# Patient Record
Sex: Male | Born: 1965
Health system: Southern US, Community
[De-identification: ages and names within clinical notes are randomized; demographics above are authoritative.]

## PROBLEM LIST (undated history)

## (undated) DIAGNOSIS — M51369 Other intervertebral disc degeneration, lumbar region without mention of lumbar back pain or lower extremity pain: Secondary | ICD-10-CM

## (undated) DIAGNOSIS — E785 Hyperlipidemia, unspecified: Secondary | ICD-10-CM

## (undated) DIAGNOSIS — F419 Anxiety disorder, unspecified: Secondary | ICD-10-CM

## (undated) DIAGNOSIS — F329 Major depressive disorder, single episode, unspecified: Secondary | ICD-10-CM

## (undated) DIAGNOSIS — S83209A Unspecified tear of unspecified meniscus, current injury, unspecified knee, initial encounter: Secondary | ICD-10-CM

## (undated) DIAGNOSIS — I10 Essential (primary) hypertension: Secondary | ICD-10-CM

## (undated) DIAGNOSIS — M1990 Unspecified osteoarthritis, unspecified site: Secondary | ICD-10-CM

## (undated) DIAGNOSIS — F32A Depression, unspecified: Secondary | ICD-10-CM

## (undated) DIAGNOSIS — M5136 Other intervertebral disc degeneration, lumbar region: Secondary | ICD-10-CM

## (undated) DIAGNOSIS — M5126 Other intervertebral disc displacement, lumbar region: Secondary | ICD-10-CM

## (undated) HISTORY — DX: Hemochromatosis, unspecified: E83.119

## (undated) HISTORY — DX: Essential (primary) hypertension: I10

## (undated) HISTORY — DX: Hyperlipidemia, unspecified: E78.5

## (undated) HISTORY — PX: KNEE ARTHROSCOPY: SUR90

## (undated) HISTORY — PX: OTHER SURGICAL HISTORY: SHX169

---

## 1998-05-08 ENCOUNTER — Ambulatory Visit (HOSPITAL_COMMUNITY): Admission: RE | Admit: 1998-05-08 | Discharge: 1998-05-08 | Payer: Self-pay | Admitting: Family Medicine

## 1999-01-28 ENCOUNTER — Encounter: Admission: RE | Admit: 1999-01-28 | Discharge: 1999-03-30 | Payer: Self-pay | Admitting: Anesthesiology

## 2003-12-17 ENCOUNTER — Encounter: Admission: RE | Admit: 2003-12-17 | Discharge: 2003-12-17 | Payer: Self-pay | Admitting: Family Medicine

## 2005-12-07 ENCOUNTER — Ambulatory Visit (HOSPITAL_COMMUNITY): Admission: RE | Admit: 2005-12-07 | Discharge: 2005-12-07 | Payer: Self-pay | Admitting: Family Medicine

## 2007-01-04 ENCOUNTER — Emergency Department (HOSPITAL_COMMUNITY): Admission: EM | Admit: 2007-01-04 | Discharge: 2007-01-04 | Payer: Self-pay | Admitting: Emergency Medicine

## 2011-01-15 ENCOUNTER — Encounter: Payer: Self-pay | Admitting: Family Medicine

## 2011-10-10 ENCOUNTER — Other Ambulatory Visit: Payer: Self-pay | Admitting: Family Medicine

## 2011-10-10 DIAGNOSIS — R1011 Right upper quadrant pain: Secondary | ICD-10-CM

## 2011-10-12 ENCOUNTER — Ambulatory Visit (HOSPITAL_COMMUNITY)
Admission: RE | Admit: 2011-10-12 | Discharge: 2011-10-12 | Disposition: A | Discharge status: Home / Self Care | Payer: 59 | Source: Ambulatory Visit | Attending: Family Medicine | Admitting: Family Medicine

## 2011-10-12 DIAGNOSIS — I10 Essential (primary) hypertension: Secondary | ICD-10-CM | POA: Insufficient documentation

## 2011-10-12 DIAGNOSIS — K824 Cholesterolosis of gallbladder: Secondary | ICD-10-CM | POA: Insufficient documentation

## 2011-10-12 DIAGNOSIS — R1011 Right upper quadrant pain: Secondary | ICD-10-CM | POA: Insufficient documentation

## 2011-10-12 DIAGNOSIS — E785 Hyperlipidemia, unspecified: Secondary | ICD-10-CM | POA: Insufficient documentation

## 2012-02-16 ENCOUNTER — Telehealth: Payer: Self-pay

## 2012-02-16 NOTE — Telephone Encounter (Signed)
.  [facility] Pt would like copies of his prior immunizations done here.  Please call patient with status of records at [phone].

## 2012-02-17 NOTE — Telephone Encounter (Signed)
Copied immunizations (2 tdap). Lmom to pick up.

## 2012-02-25 ENCOUNTER — Ambulatory Visit (INDEPENDENT_AMBULATORY_CARE_PROVIDER_SITE_OTHER): Payer: 59 | Admitting: Family Medicine

## 2012-02-25 ENCOUNTER — Ambulatory Visit: Payer: 59

## 2012-02-25 VITALS — BP 136/81 | HR 84 | Temp 98.2°F | Resp 16 | Ht 72.0 in | Wt 194.0 lb

## 2012-02-25 DIAGNOSIS — M1710 Unilateral primary osteoarthritis, unspecified knee: Secondary | ICD-10-CM

## 2012-02-25 DIAGNOSIS — M25569 Pain in unspecified knee: Secondary | ICD-10-CM

## 2012-02-25 DIAGNOSIS — M25561 Pain in right knee: Secondary | ICD-10-CM

## 2012-02-25 MED ORDER — MELOXICAM 7.5 MG PO TABS: 7.5000 mg | ORAL_TABLET | Freq: Every day | ORAL | Status: DC

## 2012-02-25 MED ORDER — HYDROCODONE-ACETAMINOPHEN 5-325 MG PO TABS: 1.0000 | ORAL_TABLET | Freq: Four times a day (QID) | ORAL | Status: AC | PRN
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
## 2012-03-16 ENCOUNTER — Ambulatory Visit: Payer: Self-pay | Admitting: Family Medicine

## 2012-03-16 VITALS — BP 134/83 | HR 77 | Temp 98.1°F | Resp 16 | Ht 71.0 in | Wt 200.0 lb

## 2012-03-16 DIAGNOSIS — M25569 Pain in unspecified knee: Secondary | ICD-10-CM

## 2012-03-16 DIAGNOSIS — M25561 Pain in right knee: Secondary | ICD-10-CM

## 2012-03-16 DIAGNOSIS — M1711 Unilateral primary osteoarthritis, right knee: Secondary | ICD-10-CM

## 2012-03-16 DIAGNOSIS — IMO0002 Reserved for concepts with insufficient information to code with codable children: Secondary | ICD-10-CM

## 2012-03-16 DIAGNOSIS — M1710 Unilateral primary osteoarthritis, unspecified knee: Secondary | ICD-10-CM

## 2012-03-16 MED ORDER — TRIAMCINOLONE ACETONIDE 40 MG/ML IJ SUSP: 40.0000 mg | Freq: Once | INTRAMUSCULAR | Status: DC

## 2012-03-16 MED ORDER — BUPIVACAINE HCL 0.5 % IJ SOLN: 50.0000 mL | Freq: Once | INTRAMUSCULAR | Status: DC

## 2012-03-16 MED ORDER — LIDOCAINE HCL (PF) 1 % IJ SOLN: 2.0000 mL | Freq: Once | INTRAMUSCULAR | Status: DC

## 2012-03-16 MED ORDER — BUPIVACAINE HCL 0.5 % IJ SOLN: 2.0000 mL | Freq: Once | INTRAMUSCULAR | Status: DC

## 2012-03-16 NOTE — Progress Notes (Signed)
[facility]:  Office Visit  Chief Complaint:  Chief Complaint  Patient presents with  . Knee Pain    R - not getting any better    HPI: Blake Roberts is a 46 y.o. male who complains of  Right knee pain on medial aspect. He was seen here on 02/25/12 by Dr. Neva Roberts and Blake Roberts were done showing DJD/OA. HE opted to do conservative txt with NSAIDs. Tried conservative treatment without relief. Patient is here because he ants to try steroid injections.   No past medical history on file. No past surgical history on file. History   Social History  . Marital Status: Single    Spouse Name: N/A    Number of Children: N/A  . Years of Education: N/A   Social History Main Topics  . Smoking status: Never Smoker   . Smokeless tobacco: None  . Alcohol Use: None  . Drug Use: None  . Sexually Active: None   Other Topics Concern  . None   Social History Narrative  . None   No family history on file. Allergies  Allergen Reactions  . Ace Inhibitors    Prior to Admission medications   Medication Sig Start Date End Date Taking? Authorizing Provider  amLODipine (NORVASC) 5 MG tablet Take 10 mg by mouth daily.    Yes Historical Provider, MD  buPROPion (WELLBUTRIN XL) 300 MG 24 hr tablet Take 300 mg by mouth daily.   Yes Historical Provider, MD  eszopiclone (LUNESTA) 1 MG TABS Take 3 mg by mouth at bedtime. Take immediately before bedtime   Yes Historical Provider, MD  lamoTRIgine (LAMICTAL) 200 MG tablet Take 200 mg by mouth daily.   Yes Historical Provider, MD  sertraline (ZOLOFT) 100 MG tablet Take 100 mg by mouth daily.   Yes Historical Provider, MD  tadalafil (CIALIS) 20 MG tablet Take 20 mg by mouth daily as needed.   Yes Historical Provider, MD  zaleplon (SONATA) 10 MG capsule Take 10 mg by mouth at bedtime.   Yes Historical Provider, MD  meloxicam (MOBIC) 7.5 MG tablet Take 1 tablet (7.5 mg total) by mouth daily. 02/25/12 02/24/13  Blake Flood, MD     ROS: The  patient denies fevers, chills, night sweats, unintentional weight loss, chest pain, palpitations, wheezing, dyspnea on exertion, nausea, vomiting, abdominal pain, dysuria, hematuria, melena, numbness, weakness, or tingling. + R knee pain  All other systems have been reviewed and were otherwise negative with the exception of those mentioned in the HPI and as above.    PHYSICAL EXAM: Filed Vitals:   03/16/12 0944  BP: 134/83  Pulse: 77  Temp: 98.1 F (36.7 C)  Resp: 16   Filed Vitals:   03/16/12 0944  Height: 5\' 11"  (1.803 m)  Weight: 200 lb (90.719 kg)   Body mass index is 27.89 kg/(m^2).  General: Alert, no acute distress HEENT:  Normocephalic, atraumatic, oropharynx patent.  Cardiovascular:  Regular rate and rhythm, no rubs murmurs or gallops.  No Carotid bruits, radial pulse intact. No pedal edema.  Respiratory: Clear to auscultation bilaterally.  No wheezes, rales, or rhonchi.  No cyanosis, no use of accessory musculature GI: No organomegaly, abdomen is soft and non-tender, positive bowel sounds.  No masses. Skin: No rashes. Neurologic: Facial musculature symmetric. Psychiatric: Patient is appropriate throughout our interaction. Lymphatic: No cervical lymphadenopathy Musculoskeletal: Gait intact. Right knee: + Right medial knee tenderness at joint line, minimal effusion on ballotment on medial aspect ROM intact. 5/5  strength, sensation intact.  Post tib and dorsalis pedis intact. No rashes.  Lachmann negative.    LABS: No results found for this or any previous visit.   EKG/XRAY:   Primary read interpreted by Dr. Conley Roberts at [facility].   ASSESSMENT/PLAN: Encounter Diagnoses  Name Primary?  . Knee pain, right Yes  . Osteoarthritis of right knee     Patient verbally consented to procedure after risks and benefits expalined including depigmentation, infection, blood loss, fat atrophy/dimpling, etc. Lateral approach for injection was completed. 1 ml of Kenalog  40 mg, 2 ml  Marcaine and 2 ml of Lidocaine.  Prepped with betadine and alcohol.  Patient tolerated procedure without complications, no blood loss.   C/w conservative treatment and ROM F/u prn   Blake Degraffenreid PHUONG, DO 03/16/2012 11:26 AM

## 2012-04-04 ENCOUNTER — Telehealth: Payer: Self-pay

## 2012-04-04 NOTE — Telephone Encounter (Signed)
Rebekah to make CD called and informed pt  On VM that CD ready for pick up.

## 2012-04-04 NOTE — Telephone Encounter (Signed)
PT HAVE AN APPT WITH A REFERRAL WE HAD MADE FOR HIM AND HE NEED TO COME BY AND P/U A COPY OF HIS XRAYS PLEASE CALL [phone]. THE APPT IS AT THE END OF NEXT WEEK

## 2012-06-11 ENCOUNTER — Other Ambulatory Visit: Payer: Self-pay

## 2012-06-11 MED ORDER — AMLODIPINE BESYLATE 5 MG PO TABS: 10.0000 mg | ORAL_TABLET | Freq: Every day | ORAL | Status: DC

## 2012-07-05 ENCOUNTER — Other Ambulatory Visit: Payer: Self-pay | Admitting: Orthopedic Surgery

## 2012-07-06 ENCOUNTER — Encounter (HOSPITAL_BASED_OUTPATIENT_CLINIC_OR_DEPARTMENT_OTHER): Payer: Self-pay | Admitting: *Deleted

## 2012-07-13 ENCOUNTER — Encounter (HOSPITAL_BASED_OUTPATIENT_CLINIC_OR_DEPARTMENT_OTHER): Payer: Self-pay | Admitting: Anesthesiology

## 2012-07-13 ENCOUNTER — Ambulatory Visit (HOSPITAL_BASED_OUTPATIENT_CLINIC_OR_DEPARTMENT_OTHER): Payer: BC Managed Care – PPO | Admitting: Anesthesiology

## 2012-07-13 ENCOUNTER — Ambulatory Visit (HOSPITAL_BASED_OUTPATIENT_CLINIC_OR_DEPARTMENT_OTHER)
Admission: RE | Admit: 2012-07-13 | Discharge: 2012-07-13 | Disposition: A | Discharge status: Home / Self Care | Payer: BC Managed Care – PPO | Source: Ambulatory Visit | Attending: Orthopedic Surgery | Admitting: Orthopedic Surgery

## 2012-07-13 ENCOUNTER — Encounter (HOSPITAL_BASED_OUTPATIENT_CLINIC_OR_DEPARTMENT_OTHER)
Admission: RE | Disposition: A | Discharge status: Home / Self Care | Payer: Self-pay | Source: Ambulatory Visit | Attending: Orthopedic Surgery

## 2012-07-13 ENCOUNTER — Encounter (HOSPITAL_BASED_OUTPATIENT_CLINIC_OR_DEPARTMENT_OTHER): Payer: Self-pay | Admitting: *Deleted

## 2012-07-13 DIAGNOSIS — X58XXXA Exposure to other specified factors, initial encounter: Secondary | ICD-10-CM | POA: Insufficient documentation

## 2012-07-13 DIAGNOSIS — Z79899 Other long term (current) drug therapy: Secondary | ICD-10-CM | POA: Insufficient documentation

## 2012-07-13 DIAGNOSIS — M1710 Unilateral primary osteoarthritis, unspecified knee: Secondary | ICD-10-CM | POA: Insufficient documentation

## 2012-07-13 DIAGNOSIS — Z9889 Other specified postprocedural states: Secondary | ICD-10-CM

## 2012-07-13 DIAGNOSIS — IMO0002 Reserved for concepts with insufficient information to code with codable children: Secondary | ICD-10-CM | POA: Insufficient documentation

## 2012-07-13 DIAGNOSIS — E785 Hyperlipidemia, unspecified: Secondary | ICD-10-CM | POA: Insufficient documentation

## 2012-07-13 DIAGNOSIS — I10 Essential (primary) hypertension: Secondary | ICD-10-CM | POA: Insufficient documentation

## 2012-07-13 HISTORY — DX: Anxiety disorder, unspecified: F41.9

## 2012-07-13 HISTORY — DX: Unspecified osteoarthritis, unspecified site: M19.90

## 2012-07-13 HISTORY — PX: KNEE ARTHROSCOPY: SHX127

## 2012-07-13 HISTORY — DX: Depression, unspecified: F32.A

## 2012-07-13 HISTORY — DX: Other intervertebral disc degeneration, lumbar region: M51.36

## 2012-07-13 HISTORY — DX: Unspecified tear of unspecified meniscus, current injury, unspecified knee, initial encounter: S83.209A

## 2012-07-13 HISTORY — DX: Other intervertebral disc displacement, lumbar region: M51.26

## 2012-07-13 HISTORY — DX: Major depressive disorder, single episode, unspecified: F32.9

## 2012-07-13 HISTORY — DX: Other intervertebral disc degeneration, lumbar region without mention of lumbar back pain or lower extremity pain: M51.369

## 2012-07-13 LAB — POCT I-STAT 4, (NA,K, GLUC, HGB,HCT): Hemoglobin: 15.6 g/dL (ref 13.0–17.0)

## 2012-07-13 SURGERY — ARTHROSCOPY, KNEE
Anesthesia: General | Site: Knee | Laterality: Right | Wound class: Clean

## 2012-07-13 MED ORDER — LACTATED RINGERS IV SOLN
INTRAVENOUS | Status: DC
Start: 2012-07-13 — End: 2012-07-13
  Administered 2012-07-13: 14:00:00 via INTRAVENOUS
  Administered 2012-07-13: 100 mL/h via INTRAVENOUS

## 2012-07-13 MED ORDER — LIDOCAINE HCL (CARDIAC) 20 MG/ML IV SOLN
INTRAVENOUS | Status: DC | PRN
  Administered 2012-07-13: 60 mg via INTRAVENOUS

## 2012-07-13 MED ORDER — MIDAZOLAM HCL 5 MG/5ML IJ SOLN
INTRAMUSCULAR | Status: DC | PRN
  Administered 2012-07-13: 2 mg via INTRAVENOUS

## 2012-07-13 MED ORDER — DEXAMETHASONE SODIUM PHOSPHATE 4 MG/ML IJ SOLN
INTRAMUSCULAR | Status: DC | PRN
  Administered 2012-07-13: 10 mg via INTRAVENOUS

## 2012-07-13 MED ORDER — OXYCODONE-ACETAMINOPHEN 7.5-325 MG PO TABS: 1.0000 | ORAL_TABLET | ORAL | Status: AC | PRN

## 2012-07-13 MED ORDER — BUPIVACAINE-EPINEPHRINE 0.5% -1:200000 IJ SOLN
INTRAMUSCULAR | Status: DC | PRN
  Administered 2012-07-13: 20 mL

## 2012-07-13 MED ORDER — LACTATED RINGERS IV SOLN: INTRAVENOUS | Status: DC

## 2012-07-13 MED ORDER — PROMETHAZINE HCL 25 MG/ML IJ SOLN: 6.2500 mg | INTRAMUSCULAR | Status: DC | PRN

## 2012-07-13 MED ORDER — MORPHINE SULFATE 4 MG/ML IJ SOLN
INTRAMUSCULAR | Status: DC | PRN
  Administered 2012-07-13: 4 mg via INTRAVENOUS

## 2012-07-13 MED ORDER — SODIUM CHLORIDE 0.9 % IR SOLN
Status: DC | PRN
  Administered 2012-07-13: 9000 mL

## 2012-07-13 MED ORDER — KETOROLAC TROMETHAMINE 30 MG/ML IJ SOLN
INTRAMUSCULAR | Status: DC | PRN
  Administered 2012-07-13: 30 mg via INTRAVENOUS

## 2012-07-13 MED ORDER — FENTANYL CITRATE 0.05 MG/ML IJ SOLN: 25.0000 ug | INTRAMUSCULAR | Status: DC | PRN

## 2012-07-13 MED ORDER — FENTANYL CITRATE 0.05 MG/ML IJ SOLN
INTRAMUSCULAR | Status: DC | PRN
  Administered 2012-07-13: 50 ug via INTRAVENOUS
  Administered 2012-07-13: 100 ug via INTRAVENOUS
  Administered 2012-07-13: 50 ug via INTRAVENOUS

## 2012-07-13 MED ORDER — OXYCODONE-ACETAMINOPHEN 5-325 MG PO TABS
1.0000 | ORAL_TABLET | ORAL | Status: DC | PRN
  Administered 2012-07-13: 1 via ORAL

## 2012-07-13 MED ORDER — ONDANSETRON HCL 4 MG/2ML IJ SOLN
INTRAMUSCULAR | Status: DC | PRN
  Administered 2012-07-13: 4 mg via INTRAVENOUS

## 2012-07-13 MED ORDER — EPINEPHRINE HCL 1 MG/ML IJ SOLN
INTRAMUSCULAR | Status: DC | PRN
  Administered 2012-07-13: 2 mg

## 2012-07-13 MED ORDER — PROPOFOL 10 MG/ML IV EMUL
INTRAVENOUS | Status: DC | PRN
  Administered 2012-07-13: 250 mg via INTRAVENOUS

## 2012-07-13 MED ORDER — POVIDONE-IODINE 7.5 % EX SOLN: Freq: Once | CUTANEOUS | Status: DC

## 2012-07-13 SURGICAL SUPPLY — 51 items
BANDAGE ELASTIC 6 VELCRO ST LF (GAUZE/BANDAGES/DRESSINGS) ×3 IMPLANT
BANDAGE ESMARK 6X9 LF (GAUZE/BANDAGES/DRESSINGS) ×1 IMPLANT
BANDAGE GAUZE ELAST BULKY 4 IN (GAUZE/BANDAGES/DRESSINGS) ×2 IMPLANT
BLADE 4.2CUDA (BLADE) IMPLANT
BLADE CUDA 5.5 (BLADE) IMPLANT
BLADE CUDA SHAVER 3.5 (BLADE) ×2 IMPLANT
BLADE CUTTER GATOR 3.5 (BLADE) IMPLANT
BLADE GREAT WHITE 4.2 (BLADE) IMPLANT
BNDG CMPR 9X6 STRL LF SNTH (GAUZE/BANDAGES/DRESSINGS) ×1
BNDG ESMARK 6X9 LF (GAUZE/BANDAGES/DRESSINGS) ×2
CANISTER SUCT LVC 12 LTR MEDI- (MISCELLANEOUS) ×3 IMPLANT
CANISTER SUCTION 1200CC (MISCELLANEOUS) ×2 IMPLANT
CLOTH BEACON ORANGE TIMEOUT ST (SAFETY) ×2 IMPLANT
DRAPE ARTHROSCOPY W/POUCH 114 (DRAPES) ×2 IMPLANT
DRAPE LG THREE QUARTER DISP (DRAPES) ×2 IMPLANT
DRSG EMULSION OIL 3X3 NADH (GAUZE/BANDAGES/DRESSINGS) ×2 IMPLANT
DRSG PAD ABDOMINAL 8X10 ST (GAUZE/BANDAGES/DRESSINGS) ×1 IMPLANT
DURAPREP 26ML APPLICATOR (WOUND CARE) ×2 IMPLANT
ELECT MENISCUS 165MM 90D (ELECTRODE) IMPLANT
ELECT REM PT RETURN 9FT ADLT (ELECTROSURGICAL)
ELECTRODE REM PT RTRN 9FT ADLT (ELECTROSURGICAL) IMPLANT
GLOVE BIOGEL PI IND STRL 8 (GLOVE) ×1 IMPLANT
GLOVE BIOGEL PI INDICATOR 8 (GLOVE) ×1
GLOVE ECLIPSE 8.0 STRL XLNG CF (GLOVE) ×4 IMPLANT
GLOVE INDICATOR 8.0 STRL GRN (GLOVE) ×4 IMPLANT
GOWN PREVENTION PLUS LG XLONG (DISPOSABLE) ×2 IMPLANT
GOWN PREVENTION PLUS XLARGE (GOWN DISPOSABLE) ×1 IMPLANT
GOWN STRL NON-REIN LRG LVL3 (GOWN DISPOSABLE) ×1 IMPLANT
GOWN STRL REIN XL XLG (GOWN DISPOSABLE) ×2 IMPLANT
IV NS IRRIG 3000ML ARTHROMATIC (IV SOLUTION) ×5 IMPLANT
KNEE WRAP E Z 3 GEL PACK (MISCELLANEOUS) ×2 IMPLANT
NDL HYPO 18GX1.5 BLUNT FILL (NEEDLE) ×1 IMPLANT
NDL SAFETY ECLIPSE 18X1.5 (NEEDLE) ×1 IMPLANT
NEEDLE HYPO 18GX1.5 BLUNT FILL (NEEDLE) ×2 IMPLANT
NEEDLE HYPO 18GX1.5 SHARP (NEEDLE) ×2
PACK ARTHROSCOPY DSU (CUSTOM PROCEDURE TRAY) ×2 IMPLANT
PACK BASIN DAY SURGERY FS (CUSTOM PROCEDURE TRAY) ×2 IMPLANT
PADDING CAST ABS 4INX4YD NS (CAST SUPPLIES) ×1
PADDING CAST ABS COTTON 4X4 ST (CAST SUPPLIES) ×1 IMPLANT
PENCIL BUTTON HOLSTER BLD 10FT (ELECTRODE) IMPLANT
SET ARTHROSCOPY TUBING (MISCELLANEOUS) ×2
SET ARTHROSCOPY TUBING LN (MISCELLANEOUS) ×1 IMPLANT
SPONGE GAUZE 4X4 12PLY (GAUZE/BANDAGES/DRESSINGS) ×2 IMPLANT
SUT ETHIBOND 2 OS 4 DA (SUTURE) IMPLANT
SUT ETHILON 4 0 PS 2 18 (SUTURE) ×2 IMPLANT
SUT VIC AB 0 CT1 36 (SUTURE) IMPLANT
SUT VIC AB 2-0 PS2 27 (SUTURE) IMPLANT
SYRINGE 10CC LL (SYRINGE) ×2 IMPLANT
TOWEL OR 17X24 6PK STRL BLUE (TOWEL DISPOSABLE) ×2 IMPLANT
WAND 90 DEG TURBOVAC W/CORD (SURGICAL WAND) IMPLANT
WATER STERILE IRR 500ML POUR (IV SOLUTION) ×2 IMPLANT

## 2012-07-13 NOTE — H&P (Signed)
Blake Roberts is an 46 y.o. male.   Chief Complaint:painful rt knee HPIMRI demonstrates torn medial meniscus  Past Medical History  Diagnosis Date  . Hyperlipidemia   . Hypertension   . Acute meniscal tear of knee RIGHT KNEE  . Depression   . Anxiety   . Bulging lumbar disc L5 - S1  . Arthritis RIGHT KNEE    Past Surgical History  Procedure Date  . Knee arthroscopy AGE 25 (APPROX)    RIGHT KNEE    Family History  Problem Relation Age of Onset  . Cancer Mother   . Diabetes Father   . Heart disease Father   . Hyperlipidemia Father   . Hypertension Father   . Kidney disease Father    Social History:  reports that he has never smoked. He has never used smokeless tobacco. He reports that he drinks about 8.4 ounces of alcohol per week. He reports that he does not use illicit drugs.  Allergies:  Allergies  Allergen Reactions  . Ace Inhibitors Other (See Comments)    ANGIOEDEMA OF JOINTS- SEVERE    Medications Prior to Admission  Medication Sig Dispense Refill  . amLODipine (NORVASC) 5 MG tablet Take 5 mg by mouth every morning.      Marland Kitchen buPROPion (WELLBUTRIN XL) 300 MG 24 hr tablet Take 300 mg by mouth daily.      . clonazePAM (KLONOPIN) 0.5 MG tablet Take 0.5 mg by mouth 2 (two) times daily as needed.      Marland Kitchen Ketorolac Tromethamine (TORADOL ORAL PO) Take by mouth as needed.      . Multiple Vitamins-Minerals (MULTIVITAMIN & MINERAL PO) Take by mouth.      . Omega-3 Fatty Acids (FISH OIL PO) Take by mouth.        Results for orders placed during the hospital encounter of 07/13/12 (from the past 48 hour(s))  POCT I-STAT 4, (NA,K, GLUC, HGB,HCT)     Status: Normal   Collection Time   07/13/12 10:54 AM      Component Value Range Comment   Sodium 142  135 - 145 mEq/L    Potassium 4.8  3.5 - 5.1 mEq/L    Glucose, Bld 95  70 - 99 mg/dL    HCT 40.9  81.1 - 91.4 %    Hemoglobin 15.6  13.0 - 17.0 g/dL    No results found.  ROS  Blood pressure 120/81, pulse 71, temperature  97.6 F (36.4 C), temperature source Oral, resp. rate 18, height 5\' 11"  (1.803 m), weight 86.183 kg (190 lb), SpO2 96.00%. Physical Exam  Constitutional: He is oriented to person, place, and time. He appears well-developed and well-nourished.  HENT:  Head: Normocephalic and atraumatic.  Right Ear: External ear normal.  Left Ear: External ear normal.  Nose: Nose normal.  Mouth/Throat: Oropharynx is clear and moist.  Eyes: Conjunctivae and EOM are normal. Pupils are equal, round, and reactive to light.  Neck: Normal range of motion. Neck supple.  Cardiovascular: Normal rate, regular rhythm, normal heart sounds and intact distal pulses.   Respiratory: Effort normal and breath sounds normal.  GI: Soft. Bowel sounds are normal.  Musculoskeletal: Normal range of motion. He exhibits tenderness.       Tender medial joint line rt knee  Neurological: He is alert and oriented to person, place, and time. He has normal reflexes.  Skin: Skin is warm and dry.  Psychiatric: He has a normal mood and affect. His behavior is normal. Judgment and  thought content normal.     Assessment/Plan Torn medial meniscus rt knee Rt knee arthroscopy with partial medial meniscectomy  Lillan Mccreadie P 07/13/2012, 12:39 PM

## 2012-07-13 NOTE — Anesthesia Postprocedure Evaluation (Signed)
  Anesthesia Post-op Note  Patient: Blake Roberts  Procedure(s) Performed: Procedure(s) (LRB): ARTHROSCOPY KNEE (Right)  Patient Location: PACU  Anesthesia Type: General  Level of Consciousness: awake and alert   Airway and Oxygen Therapy: Patient Spontanous Breathing  Post-op Pain: mild  Post-op Assessment: Post-op Vital signs reviewed, Patient's Cardiovascular Status Stable, Respiratory Function Stable, Patent Airway and No signs of Nausea or vomiting  Post-op Vital Signs: stable  Complications: No apparent anesthesia complications

## 2012-07-13 NOTE — Anesthesia Preprocedure Evaluation (Signed)
Anesthesia Evaluation  Patient identified by MRN, date of birth, ID band Patient awake    Reviewed: Allergy & Precautions, H&P , NPO status , Patient's Chart, lab work & pertinent test results  Airway Mallampati: II TM Distance: >3 FB Neck ROM: Full    Dental  (+) Teeth Intact and Dental Advisory Given,    Pulmonary neg pulmonary ROS,  breath sounds clear to auscultation  Pulmonary exam normal       Cardiovascular hypertension, Pt. on medications Rhythm:Regular Rate:Normal     Neuro/Psych Anxiety Depression negative neurological ROS     GI/Hepatic negative GI ROS, Neg liver ROS,   Endo/Other  negative endocrine ROS  Renal/GU negative Renal ROS  negative genitourinary   Musculoskeletal negative musculoskeletal ROS (+)   Abdominal   Peds  Hematology negative hematology ROS (+)   Anesthesia Other Findings   Reproductive/Obstetrics negative OB ROS                           Anesthesia Physical Anesthesia Plan  ASA: II  Anesthesia Plan: General   Post-op Pain Management:    Induction: Intravenous  Airway Management Planned: LMA  Additional Equipment:   Intra-op Plan:   Post-operative Plan: Extubation in OR  Informed Consent: I have reviewed the patients History and Physical, chart, labs and discussed the procedure including the risks, benefits and alternatives for the proposed anesthesia with the patient or authorized representative who has indicated his/her understanding and acceptance.   Dental advisory given  Plan Discussed with: CRNA  Anesthesia Plan Comments:         Anesthesia Quick Evaluation

## 2012-07-13 NOTE — Brief Op Note (Signed)
07/13/2012  2:02 PM  PATIENT:  Blake Roberts  46 y.o. male  PRE-OPERATIVE DIAGNOSIS:  RIGHT KNEE TORN MEDIAL MENISCUS ,osteoarthritis and loose body  POST-OPERATIVE DIAGNOSIS:  SAME  PROCEDURE:  Procedure(s) (LRB): ARTHROSCOPY KNEE (Right)with partial medial meniscectomy, shaving medial femoral condyle and removal loose body  SURGEON:  Surgeon(s) and Role:    * Drucilla Schmidt, MD - Primary  PHYSICIAN ASSISTANT:   ASSISTANTS: nurse  ANESTHESIA:   local and general  EBL:  Total I/O In: 1300 [I.V.:1300] Out: -   BLOOD ADMINISTERED:none  DRAINS: none   LOCAL MEDICATIONS USED:  MARCAINE     SPECIMEN:  No Specimen  DISPOSITION OF SPECIMEN:  N/A  COUNTS:  YES  TOURNIQUET:   Total Tourniquet Time Documented: Thigh (Right) - 46 minutes  DICTATION: .Other Dictation: Dictation Number [id number]  PLAN OF CARE: Discharge to home after PACU  PATIENT DISPOSITION:  PACU - hemodynamically stable.   Delay start of Pharmacological VTE agent (>24hrs) due to surgical blood loss or risk of bleeding: not applicable

## 2012-07-13 NOTE — Transfer of Care (Signed)
Immediate Anesthesia Transfer of Care Note  Patient: Blake Roberts  Procedure(s) Performed: Procedure(s) (LRB): ARTHROSCOPY KNEE (Right)  Patient Location: PACU  Anesthesia Type: General  Level of Consciousness: awake, alert  and oriented  Airway & Oxygen Therapy: Patient Spontanous Breathing and Patient connected to face mask oxygen  Post-op Assessment: Report given to PACU RN and Post -op Vital signs reviewed and stable  Post vital signs: Reviewed and stable  Complications: No apparent anesthesia complications

## 2012-07-16 ENCOUNTER — Encounter (HOSPITAL_BASED_OUTPATIENT_CLINIC_OR_DEPARTMENT_OTHER): Payer: Self-pay | Admitting: Orthopedic Surgery

## 2012-07-16 NOTE — Op Note (Signed)
[name], Blake Roberts               ACCOUNT NO.:  [id number]  MEDICAL RECORD NO.:  [id number]  LOCATION:                               FACILITY:  [facility]  PHYSICIAN:  Marlowe Kays, M.D.  DATE OF BIRTH:  [birth year]  DATE OF PROCEDURE:  07/13/2012 DATE OF DISCHARGE:                              OPERATIVE REPORT   PREOPERATIVE DIAGNOSES: 1. Torn medial meniscus. 2. Osteoarthritis, right knee.  POSTOPERATIVE DIAGNOSES: 1. Torn medial meniscus. 2. Osteoarthritis, right knee.  OPERATION:  Right knee arthroscopy with partial medial meniscectomy and shaving of medial femoral condyle.  SURGEON:  Marlowe Kays, M.D.  ASSISTANT:  Nurse.  ANESTHESIA:  General.  PATHOLOGY AND JUSTIFICATION FOR PROCEDURE:  Painful right knee with an MRI on 06/30/2012 demonstrating the preoperative diagnoses.  PROCEDURE:  After satisfactory general anesthesia, Ace wrap, and knee support to left lower extremity, pneumatic tourniquet applied to right lower extremity with the leg Esmarch out non sterilely, and tourniquet inflated to 300 mmHg.  Thigh stabilizer was applied.  The right leg was then prepped with DuraPrep from stabilizer to ankle, draped in sterile field.  Time-out performed.  Superior medial saline inflow.  First anteromedial portal, lateral compartment knee joint was evaluated. There was good bit of fibrous tissue and some synovitis, which I had to clear out for visualization purposes with a 3.5 shaver.  He had some grade I wear of the lateral femoral condyle.  The lateral meniscus was intact.  Reversing portals, ACL was intact.  Looking at the medial gutter, patella had normal looking joint surface.  In the medial joint, was an osteochondral body which was loose and probably what was visualized on the MRI in the suprapatellar area.  He had a grade III and some areas probably grade IV of the medial femoral condyle, which I shaved down with a 3.5 shaver, and this appeared to be where  the osteochondral fragment arose from.  Posteriorly, he did have a substantial tear of the posterior curve of the medial meniscus and into the posterior horn.  This was pictured, and I then trimmed this back to stable rim with a combination of baskets, and shaving down until smooth with 3.5 shaver.  All fluid pus was then removed.  I closed the 2 entry portals with 4-0 nylon and then injected through the inflow apparatus 20 mL 0.5% Marcaine with adrenaline, 4 mg of morphine.  I closed this portal with 4-0 nylon as well.  He was given Toradol IV. Betadine Adaptic dry sterile dressing were applied.  Tourniquet was released.  He tolerated the procedure well and was taken to recovery room satisfied condition with no known complications.          ______________________________ Marlowe Kays, M.D.     JA/MEDQ  D:  07/13/2012  T:  07/14/2012  Job:  [id number]

## 2012-07-18 ENCOUNTER — Encounter (HOSPITAL_BASED_OUTPATIENT_CLINIC_OR_DEPARTMENT_OTHER): Payer: Self-pay

## 2012-08-04 ENCOUNTER — Encounter: Payer: Self-pay | Admitting: Family Medicine

## 2012-08-04 ENCOUNTER — Ambulatory Visit (INDEPENDENT_AMBULATORY_CARE_PROVIDER_SITE_OTHER): Payer: BC Managed Care – PPO | Admitting: Family Medicine

## 2012-08-04 VITALS — BP 122/79 | HR 73 | Temp 98.4°F | Resp 16 | Ht 71.0 in | Wt 189.2 lb

## 2012-08-04 DIAGNOSIS — I10 Essential (primary) hypertension: Secondary | ICD-10-CM

## 2012-08-04 DIAGNOSIS — B360 Pityriasis versicolor: Secondary | ICD-10-CM

## 2012-08-04 DIAGNOSIS — N529 Male erectile dysfunction, unspecified: Secondary | ICD-10-CM

## 2012-08-04 MED ORDER — KETOCONAZOLE 200 MG PO TABS: 400.0000 mg | ORAL_TABLET | Freq: Once | ORAL | Status: DC

## 2012-08-04 MED ORDER — TADALAFIL 5 MG PO TABS: 5.0000 mg | ORAL_TABLET | ORAL | Status: DC | PRN

## 2012-08-04 NOTE — Progress Notes (Signed)
  Subjective:    Patient ID: KHAM ZUCKERMAN, male    DOB: Feb 26, 1966, 46 y.o.   MRN: [id number]  HPI JOHNEL YIELDING is a 46 y.o. male  HTN - has been off norvasc past 5 days.  Had lightheaded episode on meds - BP 102/64.  Wants trial off meds. Has been on norvasc for years. Has changed diet, less sodium and exercising more.  No further alcohol. Had been drinking a few beers per night when started BP.   Rash - started late last month - on torso, arms, and back of legs.  Happens most summers.  Notes with heat/humidity.  Rare itch.  Tx: none.   ED - took Cialis 5g as needed in past. Difficulty with erections at times since 30's.  Getting back together with fiancee.  Next physical due soon.   Review of Systems  Respiratory: Negative for chest tightness and shortness of breath.   Cardiovascular: Negative for chest pain.  Skin: Positive for rash.       Objective:   Physical Exam  Constitutional: He appears well-developed and well-nourished.  HENT:  Head: Normocephalic and atraumatic.  Pulmonary/Chest: Effort normal.  Skin: Skin is warm and dry.       Diffuse salmon colored patches on chest, upper shoulders, and back.  Psychiatric: He has a normal mood and affect. His behavior is normal.        Assessment & Plan:  ULYSESS WITZ is a 46 y.o. male 1. Erectile dysfunction  tadalafil (CIALIS) 5 MG tablet  2. Tinea versicolor  ketoconazole (NIZORAL) 200 MG tablet  3. HTN (hypertension)      HTN - diet controlled and improved off alcohol.  Trial off norvasc - check home BP's - consider restart if persistently over 140/90.   Tinea versicolor. Nizoral 200mg  - 2 po x1.  Use discussed.  ED - restart Cialis. pplan to schedule physical in next few months - may need testosterone testing.

## 2012-09-14 ENCOUNTER — Ambulatory Visit (INDEPENDENT_AMBULATORY_CARE_PROVIDER_SITE_OTHER): Payer: BC Managed Care – PPO | Admitting: Family Medicine

## 2012-09-14 ENCOUNTER — Encounter: Payer: Self-pay | Admitting: Family Medicine

## 2012-09-14 VITALS — BP 122/81 | HR 63 | Temp 97.4°F | Resp 16 | Ht 71.0 in | Wt 199.4 lb

## 2012-09-14 DIAGNOSIS — N529 Male erectile dysfunction, unspecified: Secondary | ICD-10-CM

## 2012-09-14 DIAGNOSIS — Z0000 Encounter for general adult medical examination without abnormal findings: Secondary | ICD-10-CM

## 2012-09-14 LAB — CBC WITH DIFFERENTIAL/PLATELET
Eosinophils Relative: 2 % (ref 0–5)
HCT: 41.4 % (ref 39.0–52.0)
Hemoglobin: 13.8 g/dL (ref 13.0–17.0)
Lymphocytes Relative: 36 % (ref 12–46)
Lymphs Abs: 2.1 10*3/uL (ref 0.7–4.0)
MCV: 95.8 fL (ref 78.0–100.0)
Monocytes Absolute: 0.5 10*3/uL (ref 0.1–1.0)
Monocytes Relative: 8 % (ref 3–12)
Platelets: 276 10*3/uL (ref 150–400)
RBC: 4.32 MIL/uL (ref 4.22–5.81)
WBC: 5.8 10*3/uL (ref 4.0–10.5)

## 2012-09-14 LAB — COMPREHENSIVE METABOLIC PANEL
ALT: 16 U/L (ref 0–53)
Albumin: 4.4 g/dL (ref 3.5–5.2)
CO2: 25 mEq/L (ref 19–32)
Calcium: 9.4 mg/dL (ref 8.4–10.5)
Chloride: 109 mEq/L (ref 96–112)
Glucose, Bld: 94 mg/dL (ref 70–99)
Sodium: 142 mEq/L (ref 135–145)
Total Protein: 6.8 g/dL (ref 6.0–8.3)

## 2012-09-14 LAB — LIPID PANEL
Cholesterol: 202 mg/dL — ABNORMAL HIGH (ref 0–200)
Triglycerides: 156 mg/dL — ABNORMAL HIGH (ref <150)

## 2012-09-14 MED ORDER — TADALAFIL 20 MG PO TABS: 10.0000 mg | ORAL_TABLET | ORAL | Status: DC | PRN

## 2012-09-14 NOTE — Progress Notes (Signed)
  Subjective:    Patient ID: Blake Roberts, male    DOB: [birth year], 46 y.o.   MRN: [id number]  HPI    Review of Systems 13 point ROS on PHS noted - no concerning responses.    Objective:   Physical Exam        Assessment & Plan:

## 2012-09-14 NOTE — Patient Instructions (Signed)
Your should receive a call or letter about your lab results within the next week to 10 days.  Return to the clinic or go to the nearest emergency room if any of your symptoms worsen or new symptoms occur. Keeping you healthy  Get these tests  Blood pressure- Have your blood pressure checked once a year by your healthcare provider.  Normal blood pressure is 120/80  Weight- Have your body mass index (BMI) calculated to screen for obesity.  BMI is a measure of body fat based on height and weight. You can also calculate your own BMI at www.nhlbisuport.com/bmi/.  Cholesterol- Have your cholesterol checked every year.  Diabetes- Have your blood sugar checked regularly if you have high blood pressure, high cholesterol, have a family history of diabetes or if you are overweight.  Screening for Colon Cancer- Colonoscopy starting at age 50.  Screening may begin sooner depending on your family history and other health conditions. Follow up colonoscopy as directed by your Gastroenterologist.  Screening for Prostate Cancer- Both blood work (PSA) and a rectal exam help screen for Prostate Cancer.  Screening begins at age 40 with African-American men and at age 50 with Caucasian men.  Screening may begin sooner depending on your family history.  Take these medicines  Aspirin- One aspirin daily can help prevent Heart disease and Stroke.  Flu shot- Every fall.  Tetanus- Every 10 years.  Zostavax- Once after the age of 60 to prevent Shingles.  Pneumonia shot- Once after the age of 65; if you are younger than 65, ask your healthcare provider if you need a Pneumonia shot.  Take these steps  Don't smoke- If you do smoke, talk to your doctor about quitting.  For tips on how to quit, go to www.smokefree.gov or call 1-800-QUIT-NOW.  Be physically active- Exercise 5 days a week for at least 30 minutes.  If you are not already physically active start slow and gradually work up to 30 minutes of moderate  physical activity.  Examples of moderate activity include walking briskly, mowing the yard, dancing, swimming, bicycling, etc.  Eat a healthy diet- Eat a variety of healthy food such as fruits, vegetables, low fat milk, low fat cheese, yogurt, lean meant, poultry, fish, beans, tofu, etc. For more information go to www.thenutritionsource.org  Drink alcohol in moderation- Limit alcohol intake to less than two drinks a day. Never drink and drive.  Dentist- Brush and floss twice daily; visit your dentist twice a year.  Depression- Your emotional health is as important as your physical health. If you're feeling down, or losing interest in things you would normally enjoy please talk to your healthcare provider.  Eye exam- Visit your eye doctor every year.  Safe sex- If you may be exposed to a sexually transmitted infection, use a condom.  Seat belts- Seat belts can save your life; always wear one.  Smoke/Carbon Monoxide detectors- These detectors need to be installed on the appropriate level of your home.  Replace batteries at least once a year.  Skin cancer- When out in the sun, cover up and use sunscreen 15 SPF or higher.  Violence- If anyone is threatening you, please tell your healthcare provider.  Living Will/ Health care power of attorney- Speak with your healthcare provider and family. 

## 2012-09-14 NOTE — Progress Notes (Signed)
Subjective:    Patient ID: Blake Roberts, male    DOB: 07/25/1966, 46 y.o.   MRN: [id number]  HPI Blake Roberts is a 46 y.o. male Here for CPE.  Feels best has felt in years, except for ED.     ED - see 08/04/12 ov - restarted Cialis 5mg  every other day as needed. Sx's on and off since in 30's.  Difficulty with maintaining erections - Cialis usually once on weekends.  Vigor feels down. Working out 4x/week.  Less weight than in past.   Had fasting blood draw this am.  Has had fluctuations in chol in past - trouble eating right at times.   Has not needed any meds for /bipolar sx's recently. PsychTomasa Rand.  Has weaned self off meds.  Only minor down episodes - lack of energy and productivity.  Upswings - just more productive.  No self destructive behaviors or mania. No recent alcohol.  No FH of prostate CA.   Review of Systems     Objective:   Physical Exam  Constitutional: He is oriented to person, place, and time. He appears well-developed and well-nourished.  HENT:  Head: Normocephalic and atraumatic.  Right Ear: External ear normal.  Left Ear: External ear normal.  Mouth/Throat: Oropharynx is clear and moist.  Eyes: Conjunctivae normal and EOM are normal. Pupils are equal, round, and reactive to light.  Neck: Normal range of motion. Neck supple. No thyromegaly present.  Cardiovascular: Normal rate, regular rhythm, normal heart sounds and intact distal pulses.   Pulmonary/Chest: Effort normal and breath sounds normal. No respiratory distress. He has no wheezes.  Abdominal: Soft. He exhibits no distension. There is no tenderness. Hernia confirmed negative in the right inguinal area and confirmed negative in the left inguinal area.  Genitourinary: Right testis shows no mass. Left testis shows no mass.       Prostate exam deferred.   Musculoskeletal: Normal range of motion. He exhibits no edema and no tenderness.  Lymphadenopathy:    He has no cervical adenopathy.    Neurological: He is alert and oriented to person, place, and time. He has normal reflexes.  Skin: Skin is warm and dry.  Psychiatric: He has a normal mood and affect. His behavior is normal.       Assessment & Plan:  Blake Roberts is a 46 y.o. male 1. Annual physical exam  Comprehensive metabolic panel, Lipid panel, TSH, CBC with Differential, PSA, Testosterone  2. ED (erectile dysfunction)  PSA, Testosterone, tadalafil (CIALIS) 20 MG tablet   Check labs above.  Check testosterone level., PSA and CBC in anticipation of replacement. Cialis rx - 10mg  Q36hrs prn.  Hx of Bipolar - reported controlled off meds.   Patient Instructions  Your should receive a call or letter about your lab results within the next week to 10 days.  Return to the clinic or go to the nearest emergency room if any of your symptoms worsen or new symptoms occur. Keeping you healthy  Get these tests  Blood pressure- Have your blood pressure checked once a year by your healthcare provider.  Normal blood pressure is 120/80  Weight- Have your body mass index (BMI) calculated to screen for obesity.  BMI is a measure of body fat based on height and weight. You can also calculate your own BMI at [email or web address].  Cholesterol- Have your cholesterol checked every year.  Diabetes- Have your blood sugar checked regularly if you have high blood pressure, high cholesterol, have a  family history of diabetes or if you are overweight.  Screening for Colon Cancer- Colonoscopy starting at age 54.  Screening may begin sooner depending on your family history and other health conditions. Follow up colonoscopy as directed by your Gastroenterologist.  Screening for Prostate Cancer- Both blood work (PSA) and a rectal exam help screen for Prostate Cancer.  Screening begins at age 45 with African-American men and at age 53 with Caucasian men.  Screening may begin sooner depending on your family history.  Take these  medicines  Aspirin- One aspirin daily can help prevent Heart disease and Stroke.  Flu shot- Every fall.  Tetanus- Every 10 years.  Zostavax- Once after the age of 84 to prevent Shingles.  Pneumonia shot- Once after the age of 71; if you are younger than 38, ask your healthcare provider if you need a Pneumonia shot.  Take these steps  Don't smoke- If you do smoke, talk to your doctor about quitting.  For tips on how to quit, go to www.smokefree.gov or call 1-800-QUIT-NOW.  Be physically active- Exercise 5 days a week for at least 30 minutes.  If you are not already physically active start slow and gradually work up to 30 minutes of moderate physical activity.  Examples of moderate activity include walking briskly, mowing the yard, dancing, swimming, bicycling, etc.  Eat a healthy diet- Eat a variety of healthy food such as fruits, vegetables, low fat milk, low fat cheese, yogurt, lean meant, poultry, fish, beans, tofu, etc. For more information go to www.thenutritionsource.org  Drink alcohol in moderation- Limit alcohol intake to less than two drinks a day. Never drink and drive.  Dentist- Brush and floss twice daily; visit your dentist twice a year.  Depression- Your emotional health is as important as your physical health. If you're feeling down, or losing interest in things you would normally enjoy please talk to your healthcare provider.  Eye exam- Visit your eye doctor every year.  Safe sex- If you may be exposed to a sexually transmitted infection, use a condom.  Seat belts- Seat belts can save your life; always wear one.  Smoke/Carbon Monoxide detectors- These detectors need to be installed on the appropriate level of your home.  Replace batteries at least once a year.  Skin cancer- When out in the sun, cover up and use sunscreen 15 SPF or higher.  Violence- If anyone is threatening you, please tell your healthcare provider.  Living Will/ Health care power of attorney-  Speak with your healthcare provider and family.

## 2012-09-26 ENCOUNTER — Telehealth: Payer: Self-pay

## 2012-09-26 NOTE — Telephone Encounter (Signed)
Dr. Neva Seat patient called for his lab results.    [facility]:  [phone]

## 2012-09-26 NOTE — Telephone Encounter (Signed)
Please review labs. 

## 2012-09-29 ENCOUNTER — Other Ambulatory Visit (INDEPENDENT_AMBULATORY_CARE_PROVIDER_SITE_OTHER): Payer: BC Managed Care – PPO | Admitting: Family Medicine

## 2012-09-29 DIAGNOSIS — Z0000 Encounter for general adult medical examination without abnormal findings: Secondary | ICD-10-CM

## 2012-09-29 DIAGNOSIS — N529 Male erectile dysfunction, unspecified: Secondary | ICD-10-CM

## 2012-10-09 ENCOUNTER — Other Ambulatory Visit: Payer: Self-pay | Admitting: Family Medicine

## 2012-10-09 DIAGNOSIS — R7989 Other specified abnormal findings of blood chemistry: Secondary | ICD-10-CM

## 2012-10-09 MED ORDER — TESTOSTERONE 20.25 MG/ACT (1.62%) TD GEL: 40.5000 mg | Freq: Every day | TRANSDERMAL | Status: DC

## 2012-10-11 ENCOUNTER — Encounter: Payer: Self-pay | Admitting: *Deleted

## 2012-10-11 ENCOUNTER — Other Ambulatory Visit: Payer: Self-pay | Admitting: Radiology

## 2012-10-11 MED ORDER — PRAVASTATIN SODIUM 20 MG PO TABS: 20.0000 mg | ORAL_TABLET | Freq: Every day | ORAL | Status: DC

## 2012-10-11 NOTE — Progress Notes (Unsigned)
Faxed form for prior auth for Androgel.  Waiting on approval.

## 2012-12-03 ENCOUNTER — Telehealth: Payer: Self-pay

## 2012-12-03 NOTE — Telephone Encounter (Signed)
We had received a prior auth request from pharmacy for pt's Androgel and received notification of denial. Pt's testosterone level is not below 300. Dr Neva Seat advised that pt can RTC in about 6 weeks IN THE MORNING and if it has dropped below 300, we can Rx again and try for PA again. LMOM for pt to CB.

## 2012-12-03 NOTE — Telephone Encounter (Signed)
Pt CB and I explained denial of PA and that he may RTC for recheck in about 6 weeks for morning testosterone level. Pt agreed.

## 2012-12-21 ENCOUNTER — Other Ambulatory Visit: Payer: Self-pay | Admitting: Orthopedic Surgery

## 2012-12-21 DIAGNOSIS — M2390 Unspecified internal derangement of unspecified knee: Secondary | ICD-10-CM

## 2012-12-25 ENCOUNTER — Other Ambulatory Visit: Payer: BC Managed Care – PPO

## 2013-04-10 ENCOUNTER — Other Ambulatory Visit: Payer: Self-pay | Admitting: Family Medicine

## 2013-04-10 DIAGNOSIS — N529 Male erectile dysfunction, unspecified: Secondary | ICD-10-CM

## 2013-04-10 NOTE — Telephone Encounter (Signed)
Dr. Neva Seat,  I'm not sure if you would refill this for 1 year from the CPE you did last fall, or if you gave fewer refills because you intended to see him back at 6 months.  You follow-up says "prn." Please advise.

## 2013-04-11 NOTE — Telephone Encounter (Signed)
Advised pt we have sent in #3, but he needs repeat testosterone. Pt stated he will come in tomorrow to have that done.

## 2013-04-11 NOTE — Telephone Encounter (Signed)
I did send in # 3, but is overdue for repeat testosterone testing.  Follow up for repeat labs in next 1 month. Let me know if there are questions.

## 2013-05-14 ENCOUNTER — Ambulatory Visit (INDEPENDENT_AMBULATORY_CARE_PROVIDER_SITE_OTHER): Payer: BC Managed Care – PPO | Admitting: Family Medicine

## 2013-05-14 ENCOUNTER — Other Ambulatory Visit: Payer: Self-pay | Admitting: Family Medicine

## 2013-05-14 VITALS — BP 143/90 | HR 66 | Temp 98.2°F | Resp 18 | Wt 212.0 lb

## 2013-05-14 DIAGNOSIS — B360 Pityriasis versicolor: Secondary | ICD-10-CM

## 2013-05-14 DIAGNOSIS — E785 Hyperlipidemia, unspecified: Secondary | ICD-10-CM

## 2013-05-14 DIAGNOSIS — Z111 Encounter for screening for respiratory tuberculosis: Secondary | ICD-10-CM

## 2013-05-14 DIAGNOSIS — N529 Male erectile dysfunction, unspecified: Secondary | ICD-10-CM

## 2013-05-14 LAB — COMPREHENSIVE METABOLIC PANEL
Albumin: 4.9 g/dL (ref 3.5–5.2)
Alkaline Phosphatase: 44 U/L (ref 39–117)
Chloride: 102 mEq/L (ref 96–112)
Glucose, Bld: 101 mg/dL — ABNORMAL HIGH (ref 70–99)
Potassium: 4.3 mEq/L (ref 3.5–5.3)
Sodium: 138 mEq/L (ref 135–145)
Total Protein: 7.6 g/dL (ref 6.0–8.3)

## 2013-05-14 MED ORDER — KETOCONAZOLE 200 MG PO TABS: 400.0000 mg | ORAL_TABLET | Freq: Once | ORAL | Status: AC

## 2013-05-14 MED ORDER — TADALAFIL 5 MG PO TABS: 5.0000 mg | ORAL_TABLET | Freq: Every day | ORAL | Status: DC | PRN

## 2013-05-14 MED ORDER — PRAVASTATIN SODIUM 20 MG PO TABS: 20.0000 mg | ORAL_TABLET | Freq: Every day | ORAL | Status: DC

## 2013-05-14 NOTE — Progress Notes (Signed)
  Tuberculosis Risk Questionnaire  1. Were you born outside the [place] in one of the following parts of the world: [place], [place], [place], [place] or [place]?  No  2. Have you traveled outside the [place] and lived for more than one month in one of the following parts of the world: [place], [place], [place], [place] or [place]?  No  3. Do you have a compromised immune system such as from any of the following conditions:HIV/AIDS, organ or bone marrow transplantation, diabetes, immunosuppressive medicines (e.g. Prednisone, Remicaide), leukemia, lymphoma, cancer of the head or neck, gastrectomy or jejunal bypass, end-stage renal disease (on dialysis), or silicosis?  No   4. Have you ever or do you plan on working in: a residential care center, a health care facility, a jail or prison or homeless shelter?  Yes (EMT)  5. Have you ever: injected illegal drugs, used crack cocaine, lived in a homeless shelter  or been in jail or prison?   No  6. Have you ever been exposed to anyone with infectious tuberculosis?  No   Tuberculosis Symptom Questionnaire  Do you currently have any of the following symptoms?  1. Unexplained cough lasting more than 3 weeks? No  Unexplained fever lasting more than 3 weeks. No   3. Night Sweats (sweating that leaves the bedclothes and sheets wet)   No  4. Shortness of Breath No  5. Chest Pain No  6. Unintentional weight loss  No  7. Unexplained fatigue (very tired for no reason) No

## 2013-05-14 NOTE — Progress Notes (Signed)
Subjective:    Patient ID: Blake Roberts, male    DOB: March 23, 1966, 47 y.o.   MRN: [id number]  HPI Blake Roberts is a 47 y.o. male Here for few concerns today. Married 6 weeks ago. Doing well. Living in [place], and looking for primary care in [place] area.   Erectile dysfunction - see prior ov's. Androgel rx denied in 11/2012 as testosterone not less than 300.  Takes Cialis - 1/2 of 20mg .  Usually on weekends only. Helps with obtaining erection. Would like to try daily use?   Results for orders placed in visit on 09/29/12  PSA      Result Value Range   PSA 0.61  <=4.00 ng/mL  TESTOSTERONE      Result Value Range   Testosterone 300.26  300 - 890 ng/dL   PPD placement for work - last one less than a year ago.  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 No new cough, night sweats, no unexplained weight loss.  Heat rash -  Hx of tinea versicolor this time each year.  Current area on upper chest, armpit, some in groin.  Past week or two.  Tried selsun blue topically - no relief. Improved with nizoral last year.   Hyperlipidemia - started on pravastatin in 09/2012 with plan of recheck in 3 months. LDL 131 in 08/2012. Stopped about 3 months ago - ran out. May have had some headache, but no new myalgias. Ok to try restart.  Outside bp in 130/80 range. Just finished 24 hour shift.  Not fasting.    Review of Systems  Constitutional: Negative for fever, chills, diaphoresis and unexpected weight change.  Respiratory: Negative for cough and shortness of breath.   Cardiovascular: Negative for chest pain.  Genitourinary: Negative for scrotal swelling and testicular pain.  Musculoskeletal: Negative for myalgias.  Skin: Positive for rash.       Objective:   Physical Exam  Vitals reviewed. Constitutional: He is oriented to person, place, and time. He appears well-developed and well-nourished.  HENT:  Head: Normocephalic and atraumatic.  Eyes: EOM are normal. Pupils are equal, round, and reactive  to light.  Neck: No JVD present. Carotid bruit is not present.  Cardiovascular: Normal rate, regular rhythm and normal heart sounds.   No murmur heard. Pulmonary/Chest: Effort normal and breath sounds normal. He has no rales.  Musculoskeletal: He exhibits no edema.  Neurological: He is alert and oriented to person, place, and time.  Skin: Skin is warm and dry. Rash noted.     Psychiatric: He has a normal mood and affect.      Assessment & Plan:  [name] is a 47 y.o. male Tinea versicolor - Plan: ketoconazole (NIZORAL) 200 MG tablet -as prior. rtc if not improving.   Other and unspecified hyperlipidemia - Plan: restart pravastatin (PRAVACHOL) 20 MG tablet, check  Comprehensive metabolic panel, then check lipids fasting in 2- 3 months - here or new primary care.  Phone number given for new primary care.   Erectile dysfunction - Plan: tadalafil (CIALIS) 5 MG tablet, Testosterone level - if low - verify with repeat am testing. Trial of cialis for daily use.  CP precautions.   Tuberculosis screening - Plan: TB Skin Test placed, rtc 48-72 hours.   Meds ordered this encounter  Medications  . ketoconazole (NIZORAL) 200 MG tablet    Sig: Take 2 tablets (400 mg total) by mouth once. Exercise, then wait few hours to shower.    Dispense:  2 tablet  Refill:  0  . tadalafil (CIALIS) 5 MG tablet    Sig: Take 1 tablet (5 mg total) by mouth daily as needed for erectile dysfunction.    Dispense:  30 tablet    Refill:  2  . pravastatin (PRAVACHOL) 20 MG tablet    Sig: Take 1 tablet (20 mg total) by mouth daily.    Dispense:  30 tablet    Refill:  2   Patient Instructions  For primary care - can call [facility] and [facility] Medicine: [phone].  Drs. Hettie Holstein, [place], [place], or [place]. You should receive a call or letter about your lab results within the next week to 10 days. You will need to have a cholesterol test in next 2-3 months - here or new primary  care provider.  Also if the testosterone level is low, would need to repeat a morning level to verify result. Try the cialis for daily use, but let me know if we need to change back to the episodic use dose.  Take the Nizoral as discussed for the Tinea.   Return to the clinic or go to the nearest emergency room if any of your symptoms worsen or new symptoms occur.  Tinea Versicolor Tinea versicolor is a common yeast infection of the skin. This condition becomes known when the yeast on our skin starts to overgrow (yeast is a normal inhabitant on our skin). This condition is noticed as white or light brown patches on brown skin, and is more evident in the summer on tanned skin. These areas are slightly scaly if scratched. The light patches from the yeast become evident when the yeast creates "holes in your suntan". This is most often noticed in the summer. The patches are usually located on the chest, back, pubis, neck and body folds. However, it may occur on any area of body. Mild itching and inflammation (redness or soreness) may be present. DIAGNOSIS  The diagnosisof this is made clinically (by looking). Cultures from samples are usually not needed. Examination under the microscope may help. However, yeast is normally found on skin. The diagnosis still remains clinical. Examination under Wood's Ultraviolet Light can determine the extent of the infection. TREATMENT  This common infection is usually only of cosmetic (only a concern to your appearance). It is easily treated with dandruff shampoo used during showers or bathing. Vigorous scrubbing will eliminate the yeast over several days time. The light areas in your skin may remain for weeks or months after the infection is cured unless your skin is exposed to sunlight. The lighter or darker spots caused by the fungus that remain after complete treatment are not a sign of treatment failure; it will take a long time to resolve. Your caregiver may recommend a  number of commercial preparations or medication by mouth if home care is not working. Recurrence is common and preventative medication may be necessary. This skin condition is not highly contagious. Special care is not needed to protect close friends and family members. Normal hygiene is usually enough. Follow up is required only if you develop complications (such as a secondary infection from scratching), if recommended by your caregiver, or if no relief is obtained from the preparations used. Document Released: 12/09/2000 Document Revised: 03/05/2012 Document Reviewed: 01/21/2009 [facility] Patient Information 2013 [place], [place].

## 2013-05-14 NOTE — Patient Instructions (Addendum)
For primary care - can call [facility] and Sports Medicine: [phone].  Drs. Hettie Holstein, [place], [place], or [place]. You should receive a call or letter about your lab results within the next week to 10 days. You will need to have a cholesterol test in next 2-3 months - here or new primary care provider.  Also if the testosterone level is low, would need to repeat a morning level to verify result. Try the cialis for daily use, but let me know if we need to change back to the episodic use dose.  Take the Nizoral as discussed for the Tinea.   Return to the clinic or go to the nearest emergency room if any of your symptoms worsen or new symptoms occur.  Tinea Versicolor Tinea versicolor is a common yeast infection of the skin. This condition becomes known when the yeast on our skin starts to overgrow (yeast is a normal inhabitant on our skin). This condition is noticed as white or light brown patches on brown skin, and is more evident in the summer on tanned skin. These areas are slightly scaly if scratched. The light patches from the yeast become evident when the yeast creates "holes in your suntan". This is most often noticed in the summer. The patches are usually located on the chest, back, pubis, neck and body folds. However, it may occur on any area of body. Mild itching and inflammation (redness or soreness) may be present. DIAGNOSIS  The diagnosisof this is made clinically (by looking). Cultures from samples are usually not needed. Examination under the microscope may help. However, yeast is normally found on skin. The diagnosis still remains clinical. Examination under Wood's Ultraviolet Light can determine the extent of the infection. TREATMENT  This common infection is usually only of cosmetic (only a concern to your appearance). It is easily treated with dandruff shampoo used during showers or bathing. Vigorous scrubbing will eliminate the yeast over several days time.  The light areas in your skin may remain for weeks or months after the infection is cured unless your skin is exposed to sunlight. The lighter or darker spots caused by the fungus that remain after complete treatment are not a sign of treatment failure; it will take a long time to resolve. Your caregiver may recommend a number of commercial preparations or medication by mouth if home care is not working. Recurrence is common and preventative medication may be necessary. This skin condition is not highly contagious. Special care is not needed to protect close friends and family members. Normal hygiene is usually enough. Follow up is required only if you develop complications (such as a secondary infection from scratching), if recommended by your caregiver, or if no relief is obtained from the preparations used. Document Released: 12/09/2000 Document Revised: 03/05/2012 Document Reviewed: 01/21/2009 [facility] Patient Information 2013 [place], [place].

## 2013-05-17 ENCOUNTER — Ambulatory Visit (INDEPENDENT_AMBULATORY_CARE_PROVIDER_SITE_OTHER): Payer: BC Managed Care – PPO | Admitting: Radiology

## 2013-05-17 DIAGNOSIS — Z111 Encounter for screening for respiratory tuberculosis: Secondary | ICD-10-CM

## 2013-05-17 DIAGNOSIS — R7611 Nonspecific reaction to tuberculin skin test without active tuberculosis: Secondary | ICD-10-CM

## 2013-05-17 LAB — TB SKIN TEST: TB Skin Test: NEGATIVE

## 2013-06-01 ENCOUNTER — Other Ambulatory Visit: Payer: Self-pay | Admitting: Family Medicine

## 2013-06-01 DIAGNOSIS — R7989 Other specified abnormal findings of blood chemistry: Secondary | ICD-10-CM

## 2013-07-09 ENCOUNTER — Ambulatory Visit (INDEPENDENT_AMBULATORY_CARE_PROVIDER_SITE_OTHER): Payer: BC Managed Care – PPO | Admitting: Family Medicine

## 2013-07-09 DIAGNOSIS — E291 Testicular hypofunction: Secondary | ICD-10-CM

## 2013-07-09 DIAGNOSIS — R7989 Other specified abnormal findings of blood chemistry: Secondary | ICD-10-CM

## 2013-07-09 NOTE — Progress Notes (Signed)
Patient is here for labs only.

## 2013-08-11 ENCOUNTER — Other Ambulatory Visit: Payer: Self-pay | Admitting: Family Medicine

## 2013-08-22 ENCOUNTER — Other Ambulatory Visit: Payer: Self-pay | Admitting: Family Medicine

## 2013-08-22 DIAGNOSIS — R7989 Other specified abnormal findings of blood chemistry: Secondary | ICD-10-CM

## 2013-10-23 ENCOUNTER — Telehealth: Payer: Self-pay

## 2013-10-23 NOTE — Telephone Encounter (Signed)
Ready for pickup. Patient needs to sign release form.

## 2013-10-23 NOTE — Telephone Encounter (Signed)
PT STATES HE HAVE MOVED TO THE [place] AREA AND IS IN DIRE NEED OF HIS IMMUNIZATION RECORDS, WILL BE IN [place]OWN TOMORROW AND REALLY WOULD LIKE TO TRY AND GET THEM PLEASE CALL [phone]

## 2013-12-26 HISTORY — PX: OTHER SURGICAL HISTORY: SHX169

## 2014-05-13 DIAGNOSIS — K219 Gastro-esophageal reflux disease without esophagitis: Secondary | ICD-10-CM | POA: Insufficient documentation

## 2014-06-09 DIAGNOSIS — M75100 Unspecified rotator cuff tear or rupture of unspecified shoulder, not specified as traumatic: Secondary | ICD-10-CM | POA: Insufficient documentation

## 2015-02-15 DIAGNOSIS — I10 Essential (primary) hypertension: Secondary | ICD-10-CM | POA: Insufficient documentation

## 2015-07-15 DIAGNOSIS — M5440 Lumbago with sciatica, unspecified side: Secondary | ICD-10-CM

## 2015-07-15 DIAGNOSIS — G8929 Other chronic pain: Secondary | ICD-10-CM | POA: Insufficient documentation

## 2015-10-14 ENCOUNTER — Ambulatory Visit (INDEPENDENT_AMBULATORY_CARE_PROVIDER_SITE_OTHER): Payer: BLUE CROSS/BLUE SHIELD | Admitting: Emergency Medicine

## 2015-10-14 VITALS — BP 130/80 | HR 104 | Temp 99.0°F | Resp 16 | Ht 71.0 in | Wt 247.0 lb

## 2015-10-14 DIAGNOSIS — M25512 Pain in left shoulder: Secondary | ICD-10-CM

## 2015-10-14 DIAGNOSIS — M7552 Bursitis of left shoulder: Secondary | ICD-10-CM | POA: Diagnosis not present

## 2015-10-14 MED ORDER — NAPROXEN SODIUM 550 MG PO TABS: 550.0000 mg | ORAL_TABLET | Freq: Two times a day (BID) | ORAL | Status: DC

## 2015-10-14 MED ORDER — METHYLPREDNISOLONE ACETATE 80 MG/ML IJ SUSP
80.0000 mg | Freq: Once | INTRAMUSCULAR | Status: AC
  Administered 2015-10-14: 80 mg via INTRAMUSCULAR

## 2015-10-14 NOTE — Patient Instructions (Signed)
Bursitis °Bursitis is inflammation and irritation of a bursa, which is one of the small, fluid-filled sacs that cushion and protect the moving parts of your body. These sacs are located between bones and muscles, muscle attachments, or skin areas next to bones. A bursa protects these structures from the wear and tear that results from frequent movement. °An inflamed bursa causes pain and swelling. Fluid may build up inside the sac. Bursitis is most common near joints, especially the knees, elbows, hips, and shoulders. °CAUSES °Bursitis can be caused by:  °· Injury from: °¨ A direct blow, like falling on your knee or elbow. °¨ Overuse of a joint (repetitive stress). °· Infection. This can happen if bacteria gets into a bursa through a cut or scrape near a joint. °· Diseases that cause joint inflammation, such as gout and rheumatoid arthritis. °RISK FACTORS °You may be at risk for bursitis if you:  °· Have a job or hobby that involves a lot of repetitive stress on your joints. °· Have a condition that weakens your body's defense system (immune system), such as diabetes, cancer, or HIV. °· Lift and reach overhead often. °· Kneel or lean on hard surfaces often. °· Run or walk often. °SIGNS AND SYMPTOMS °The most common signs and symptoms of bursitis are: °· Pain that gets worse when you move the affected body part or put weight on it. °· Inflammation. °· Stiffness. °Other signs and symptoms may include: °· Redness. °· Tenderness. °· Warmth. °· Pain that continues after rest. °· Fever and chills. This may occur in bursitis caused by infection. °DIAGNOSIS °Bursitis may be diagnosed by:  °· Medical history and physical exam. °· MRI. °· A procedure to drain fluid from the bursa with a needle (aspiration). The fluid may be checked for signs of infection or gout. °· Blood tests to rule out other causes of inflammation. °TREATMENT  °Bursitis can usually be treated at home with rest, ice, compression, and elevation (RICE). For  mild bursitis, RICE treatment may be all you need. Other treatments may include: °· Nonsteroidal anti-inflammatory drugs (NSAIDs) to treat pain and inflammation. °· Corticosteroids to fight inflammation. You may have these drugs injected into and around the area of bursitis. °· Aspiration of bursitis fluid to relieve pain and improve movement. °· Antibiotic medicine to treat an infected bursa. °· A splint, brace, or walking aid. °· Physical therapy if you continue to have pain or limited movement. °· Surgery to remove a damaged or infected bursa. This may be needed if you have a very bad case of bursitis or if other treatments have not worked. °HOME CARE INSTRUCTIONS  °· Take medicines only as directed by your health care provider. °· If you were prescribed an antibiotic medicine, finish it all even if you start to feel better. °· Rest the affected area as directed by your health care provider. °¨ Keep the area elevated. °¨ Avoid activities that make pain worse. °· Apply ice to the injured area: °¨ Place ice in a plastic bag. °¨ Place a towel between your skin and the bag. °¨ Leave the ice on for 20 minutes, 2-3 times a day. °· Use splints, braces, pads, or walking aids as directed by your health care provider. °· Keep all follow-up visits as directed by your health care provider. This is important. °PREVENTION  °· Wear knee pads if you kneel often. °· Wear sturdy running or walking shoes that fit you well. °· Take regular breaks from repetitive activity. °· Warm   up by stretching before doing any strenuous activity. °· Maintain a healthy weight or lose weight as recommended by your health care provider. Ask your health care provider if you need help. °· Exercise regularly. Start any new physical activity gradually. °SEEK MEDICAL CARE IF:  °· Your bursitis is not responding to treatment or home care. °· You have a fever. °· You have chills. °  °This information is not intended to replace advice given to you by your  health care provider. Make sure you discuss any questions you have with your health care provider. °  °Document Released: 12/09/2000 Document Revised: 09/02/2015 Document Reviewed: 03/03/2014 °Elsevier Interactive Patient Education ©2016 [employer] ° °

## 2015-10-14 NOTE — Progress Notes (Signed)
Subjective:  Patient ID: Blake Roberts, male    DOB: [birth year]  Age: 49 y.o. MRN: [id number]  CC: Shoulder Pain   HPI Blake Roberts presents  with a history of right rotator cuff injury treated surgically last year. Now he is resumed weight lifting and doing physicals exercise. He has pain in his left shoulder with decreased range of motion. Pain is worse when he lays on her shoulder at night. No history of injury or direct injury or swelling or bruising  History Blake Roberts has a past medical history of Hyperlipidemia; Hypertension; Acute meniscal tear of knee (RIGHT KNEE); Depression; Anxiety; Bulging lumbar disc (L5 - S1); and Arthritis (RIGHT KNEE).   He has past surgical history that includes Knee arthroscopy (AGE 71 (APPROX)); Knee arthroscopy (07/13/2012); and torn rotator cuff.   His  family history includes Cancer in his mother; Diabetes in his father; Heart disease in his father; Hyperlipidemia in his father; Hypertension in his father; Kidney disease in his father.  He   reports that he has never smoked. He has never used smokeless tobacco. He reports that he does not drink alcohol or use illicit drugs.  Outpatient Prescriptions Prior to Visit  Medication Sig Dispense Refill  . Multiple Vitamins-Minerals (MULTIVITAMIN & MINERAL PO) Take by mouth.    . tadalafil (CIALIS) 5 MG tablet Take 1 tablet (5 mg total) by mouth daily as needed for erectile dysfunction. 30 tablet 2  . Omega-3 Fatty Acids (FISH OIL PO) Take by mouth.    . pravastatin (PRAVACHOL) 20 MG tablet Take 1 tablet (20 mg total) by mouth daily. (Patient not taking: Reported on 10/14/2015) 30 tablet 2  . Testosterone (ANDROGEL PUMP) 20.25 MG/ACT (1.62%) GEL Place 40.5 mg onto the skin daily. (Patient not taking: Reported on 10/14/2015) 75 g 1   No facility-administered medications prior to visit.    Social History   Social History  . Marital Status: Single    Spouse Name: N/A  . Number of Children: N/A  .  Years of Education: N/A   Social History Main Topics  . Smoking status: Never Smoker   . Smokeless tobacco: Never Used  . Alcohol Use: No     Comment: quit drinking a beer x 1 month  . Drug Use: No  . Sexual Activity: Yes   Other Topics Concern  . None   Social History Narrative     Review of Systems  Constitutional: Negative for fever, chills and appetite change.  HENT: Negative for congestion, ear pain, postnasal drip, sinus pressure and sore throat.   Eyes: Negative for pain and redness.  Respiratory: Negative for cough, shortness of breath and wheezing.   Cardiovascular: Negative for leg swelling.  Gastrointestinal: Negative for nausea, vomiting, abdominal pain, diarrhea, constipation and blood in stool.  Endocrine: Negative for polyuria.  Genitourinary: Negative for dysuria, urgency, frequency and flank pain.  Musculoskeletal: Negative for gait problem.  Skin: Negative for rash.  Neurological: Negative for weakness and headaches.  Psychiatric/Behavioral: Negative for confusion and decreased concentration. The patient is not nervous/anxious.     Objective:  BP 130/80 mmHg  Pulse 104  Temp(Src) 99 F (37.2 C) (Oral)  Resp 16  Ht 5\' 11"  (1.803 m)  Wt 247 lb (112.038 kg)  BMI 34.46 kg/m2  SpO2 96%  Physical Exam  Constitutional: He is oriented to person, place, and time. He appears well-developed and well-nourished. No distress.  HENT:  Head: Normocephalic and atraumatic.  Right Ear: External ear normal.  Left Ear: External ear normal.  Nose: Nose normal.  Eyes: Conjunctivae and EOM are normal. Pupils are equal, round, and reactive to light. No scleral icterus.  Neck: Normal range of motion. Neck supple. No tracheal deviation present.  Cardiovascular: Normal rate, regular rhythm and normal heart sounds.   Pulmonary/Chest: Effort normal. No respiratory distress. He has no wheezes. He has no rales.  Abdominal: He exhibits no mass. There is no tenderness. There is  no rebound and no guarding.  Musculoskeletal: He exhibits no edema.       Left shoulder: He exhibits decreased range of motion and tenderness.  Lymphadenopathy:    He has no cervical adenopathy.  Neurological: He is alert and oriented to person, place, and time. Coordination normal.  Skin: Skin is warm and dry. No rash noted.  Psychiatric: He has a normal mood and affect. His behavior is normal.      Assessment & Plan:   Blake Roberts was seen today for shoulder pain.  Diagnoses and all orders for this visit:  Left shoulder pain -     naproxen sodium (ANAPROX DS) 550 MG tablet; Take 1 tablet (550 mg total) by mouth 2 (two) times daily with a meal.  Shoulder bursitis, left -     methylPREDNISolone acetate (DEPO-MEDROL) injection 80 mg; Inject 1 mL (80 mg total) into the muscle once. -     naproxen sodium (ANAPROX DS) 550 MG tablet; Take 1 tablet (550 mg total) by mouth 2 (two) times daily with a meal.  Other orders -     Discontinue: naproxen sodium (ANAPROX DS) 550 MG tablet; Take 1 tablet (550 mg total) by mouth 2 (two) times daily with a meal.   I have discontinued Blake Roberts Omega-3 Fatty Acids (FISH OIL PO), Testosterone, and pravastatin. I am also having him maintain his Multiple Vitamins-Minerals (MULTIVITAMIN & MINERAL PO), tadalafil, losartan, buPROPion, amLODipine, simvastatin, pseudoephedrine-acetaminophen, aspirin, and naproxen sodium. We will continue to administer methylPREDNISolone acetate.  Meds ordered this encounter  Medications  . losartan (COZAAR) 50 MG tablet    Sig: Take 50 mg by mouth daily.  Marland Kitchen buPROPion (WELLBUTRIN XL) 300 MG 24 hr tablet    Sig: Take 300 mg by mouth daily.  Marland Kitchen amLODipine (NORVASC) 10 MG tablet    Sig: Take 10 mg by mouth daily.  . simvastatin (ZOCOR) 40 MG tablet    Sig: Take 40 mg by mouth daily.  . pseudoephedrine-acetaminophen (TYLENOL SINUS) 30-500 MG TABS tablet    Sig: Take 1 tablet by mouth every 4 (four) hours as needed.  Marland Kitchen aspirin  325 MG tablet    Sig: Take 325 mg by mouth daily.  Marland Kitchen DISCONTD: naproxen sodium (ANAPROX DS) 550 MG tablet    Sig: Take 1 tablet (550 mg total) by mouth 2 (two) times daily with a meal.    Dispense:  40 tablet    Refill:  0  . methylPREDNISolone acetate (DEPO-MEDROL) injection 80 mg    Sig:   . naproxen sodium (ANAPROX DS) 550 MG tablet    Sig: Take 1 tablet (550 mg total) by mouth 2 (two) times daily with a meal.    Dispense:  40 tablet    Refill:  0   Injected with 80 mg Depo-Medrol 2 mL of Marcaine with good pain relief and increase mobility in the left shoulder bursa  Appropriate red flag conditions were discussed with the patient as well as actions that should be taken.  Patient expressed his understanding.  Follow-up:  Return if symptoms worsen or fail to improve.  Roselee Culver, MD

## 2015-11-28 ENCOUNTER — Ambulatory Visit (INDEPENDENT_AMBULATORY_CARE_PROVIDER_SITE_OTHER): Payer: BLUE CROSS/BLUE SHIELD | Admitting: Family Medicine

## 2015-11-28 VITALS — BP 136/78 | HR 90 | Temp 99.1°F | Resp 18 | Ht 72.0 in | Wt 241.0 lb

## 2015-11-28 DIAGNOSIS — J209 Acute bronchitis, unspecified: Secondary | ICD-10-CM | POA: Diagnosis not present

## 2015-11-28 DIAGNOSIS — N529 Male erectile dysfunction, unspecified: Secondary | ICD-10-CM | POA: Diagnosis not present

## 2015-11-28 DIAGNOSIS — R062 Wheezing: Secondary | ICD-10-CM | POA: Diagnosis not present

## 2015-11-28 MED ORDER — TADALAFIL 10 MG PO TABS: 5.0000 mg | ORAL_TABLET | Freq: Every day | ORAL | Status: DC | PRN

## 2015-11-28 MED ORDER — ALBUTEROL SULFATE HFA 108 (90 BASE) MCG/ACT IN AERS: 2.0000 | INHALATION_SPRAY | Freq: Four times a day (QID) | RESPIRATORY_TRACT | Status: DC | PRN

## 2015-11-28 MED ORDER — AZITHROMYCIN 250 MG PO TABS: ORAL_TABLET | ORAL | Status: DC

## 2015-11-28 NOTE — Progress Notes (Signed)
[facility] [address], [place] [address] [phone]  Date:  11/28/2015   Name:  Blake Roberts   DOB:  [birth year]   MRN:  [id number]  PCP:  Wendie Agreste, MD    Chief Complaint: Cough; Nasal Congestion; and Shortness of Breath   History of Present Illness:  Blake Roberts is a 49 y.o. very pleasant male patient who presents with the following:  Here today to evaluate illness.  He first noted cold like sx about 7 days ago, but now it seems to have moved into his chest over the last 3-4 days. He tried his GF's inhaler which did help some He is bringing up some mucus He has noted a low grade fever, some sweats  He is a paramedic.  His sx are no longer in his sinuses as much.  He tried some OTC decongestants.    He could use a RF of his cialis as well- he uses 5 mg at a time.  No CP, no nitro use  There are no active problems to display for this patient.   Past Medical History  Diagnosis Date  . Hyperlipidemia   . Hypertension   . Acute meniscal tear of knee RIGHT KNEE  . Depression   . Anxiety   . Bulging lumbar disc L5 - S1  . Arthritis RIGHT KNEE    Past Surgical History  Procedure Laterality Date  . Knee arthroscopy  AGE 86 (APPROX)    RIGHT KNEE  . Knee arthroscopy  07/13/2012    Procedure: ARTHROSCOPY KNEE;  Surgeon: Magnus Sinning, MD;  Location: [facility];  Service: Orthopedics;  Laterality: Right;  WITH PARTIAL MEDIAL MENISECTOMY AND REMOVAL OF LOOSE BODIES  . Torn rotator cuff      Social History  Substance Use Topics  . Smoking status: Never Smoker   . Smokeless tobacco: Never Used  . Alcohol Use: No     Comment: quit drinking a beer x 1 month    Family History  Problem Relation Age of Onset  . Cancer Mother   . Diabetes Father   . Heart disease Father   . Hyperlipidemia Father   . Hypertension Father   . Kidney disease Father     Allergies  Allergen Reactions  . Ace Inhibitors Other (See  Comments)    ANGIOEDEMA OF JOINTS- SEVERE    Medication list has been reviewed and updated.  Current Outpatient Prescriptions on File Prior to Visit  Medication Sig Dispense Refill  . amLODipine (NORVASC) 10 MG tablet Take 10 mg by mouth daily.    Marland Kitchen aspirin 325 MG tablet Take 325 mg by mouth daily.    Marland Kitchen buPROPion (WELLBUTRIN XL) 300 MG 24 hr tablet Take 300 mg by mouth daily.    Marland Kitchen losartan (COZAAR) 50 MG tablet Take 50 mg by mouth daily.    . Multiple Vitamins-Minerals (MULTIVITAMIN & MINERAL PO) Take by mouth.    . simvastatin (ZOCOR) 40 MG tablet Take 40 mg by mouth daily.    . tadalafil (CIALIS) 5 MG tablet Take 1 tablet (5 mg total) by mouth daily as needed for erectile dysfunction. 30 tablet 2  . naproxen sodium (ANAPROX DS) 550 MG tablet Take 1 tablet (550 mg total) by mouth 2 (two) times daily with a meal. (Patient not taking: Reported on 11/28/2015) 40 tablet 0  . pseudoephedrine-acetaminophen (TYLENOL SINUS) 30-500 MG TABS tablet Take 1 tablet by mouth every 4 (four) hours  as needed.     No current facility-administered medications on file prior to visit.    Review of Systems:  As per HPI- otherwise negative.   Physical Examination: Filed Vitals:   11/28/15 1049  BP: 136/78  Pulse: 90  Temp: 99.1 F (37.3 C)  Resp: 18   Filed Vitals:   11/28/15 1049  Height: 6' (1.829 m)  Weight: 241 lb (109.317 kg)   Body mass index is 32.68 kg/(m^2). Ideal Body Weight: Weight in (lb) to have BMI = 25: 183.9  GEN: WDWN, NAD, Non-toxic, A & O x 3, obese, looks well but coughing in room HEENT: Atraumatic, Normocephalic. Neck supple. No masses, No LAD.  Bilateral TM wnl, oropharynx normal.  PEERL,EOMI.   Ears and Nose: No external deformity. CV: RRR, No M/G/R. No JVD. No thrill. No extra heart sounds. PULM: CTA B, no wheezes, crackles, rhonchi. No retractions. No resp. distress. No accessory muscle use. EXTR: No c/c/e NEURO Normal gait.  PSYCH: Normally interactive. Conversant.  Not depressed or anxious appearing.  Calm demeanor.    Assessment and Plan: Acute bronchitis, unspecified organism - Plan: azithromycin (ZITHROMAX) 250 MG tablet  Wheezing - Plan: albuterol (PROVENTIL HFA;VENTOLIN HFA) 108 (90 BASE) MCG/ACT inhaler  Erectile dysfunction, unspecified erectile dysfunction type - Plan: tadalafil (CIALIS) 10 MG tablet treat for bronchitis with azithromycin and albuterol as needed ciaslis refill  Signed Lamar Blinks, MD

## 2015-11-28 NOTE — Patient Instructions (Signed)
It was good to see you today- use the azithromycin as directed and the albuterol inhaler as needed Let me know if you are not feeling better soon- Sooner if worse.

## 2015-12-25 ENCOUNTER — Other Ambulatory Visit: Payer: Self-pay | Admitting: Urgent Care

## 2015-12-25 ENCOUNTER — Ambulatory Visit (INDEPENDENT_AMBULATORY_CARE_PROVIDER_SITE_OTHER): Payer: BLUE CROSS/BLUE SHIELD | Admitting: Urgent Care

## 2015-12-25 VITALS — BP 132/88 | HR 92 | Temp 97.8°F | Resp 14 | Ht 70.5 in | Wt 240.0 lb

## 2015-12-25 DIAGNOSIS — D649 Anemia, unspecified: Secondary | ICD-10-CM

## 2015-12-25 DIAGNOSIS — F558 Abuse of other non-psychoactive substances: Secondary | ICD-10-CM | POA: Diagnosis not present

## 2015-12-25 DIAGNOSIS — R42 Dizziness and giddiness: Secondary | ICD-10-CM

## 2015-12-25 DIAGNOSIS — K30 Functional dyspepsia: Secondary | ICD-10-CM

## 2015-12-25 DIAGNOSIS — R109 Unspecified abdominal pain: Secondary | ICD-10-CM

## 2015-12-25 DIAGNOSIS — R112 Nausea with vomiting, unspecified: Secondary | ICD-10-CM | POA: Diagnosis not present

## 2015-12-25 DIAGNOSIS — F1990 Other psychoactive substance use, unspecified, uncomplicated: Secondary | ICD-10-CM

## 2015-12-25 DIAGNOSIS — R457 State of emotional shock and stress, unspecified: Secondary | ICD-10-CM | POA: Diagnosis not present

## 2015-12-25 LAB — COMPREHENSIVE METABOLIC PANEL
ALBUMIN: 4.6 g/dL (ref 3.6–5.1)
ALT: 28 U/L (ref 9–46)
AST: 21 U/L (ref 10–35)
Alkaline Phosphatase: 48 U/L (ref 40–115)
BUN: 17 mg/dL (ref 7–25)
CALCIUM: 9.7 mg/dL (ref 8.6–10.3)
CHLORIDE: 105 mmol/L (ref 98–110)
CO2: 22 mmol/L (ref 20–31)
CREATININE: 1.27 mg/dL (ref 0.70–1.33)
Glucose, Bld: 106 mg/dL — ABNORMAL HIGH (ref 65–99)
Potassium: 4.5 mmol/L (ref 3.5–5.3)
Sodium: 139 mmol/L (ref 135–146)
TOTAL PROTEIN: 7.6 g/dL (ref 6.1–8.1)
Total Bilirubin: 0.5 mg/dL (ref 0.2–1.2)

## 2015-12-25 LAB — POCT CBC
Granulocyte percent: 67.9 %G (ref 37–80)
HEMATOCRIT: 40.4 % — AB (ref 43.5–53.7)
HEMOGLOBIN: 13.7 g/dL — AB (ref 14.1–18.1)
LYMPH, POC: 2.5 (ref 0.6–3.4)
MCH: 31.3 pg — AB (ref 27–31.2)
MCHC: 34 g/dL (ref 31.8–35.4)
MCV: 92.2 fL (ref 80–97)
MID (CBC): 0.3 (ref 0–0.9)
MPV: 6.8 fL (ref 0–99.8)
POC GRANULOCYTE: 5.8 (ref 2–6.9)
POC LYMPH PERCENT: 28.8 %L (ref 10–50)
POC MID %: 3.3 % (ref 0–12)
Platelet Count, POC: 296 10*3/uL (ref 142–424)
RBC: 4.38 M/uL — AB (ref 4.69–6.13)
RDW, POC: 13.2 %
WBC: 8.6 10*3/uL (ref 4.6–10.2)

## 2015-12-25 LAB — TSH: TSH: 2.719 u[IU]/mL (ref 0.350–4.500)

## 2015-12-25 LAB — IRON AND TIBC
%SAT: 18 % (ref 15–60)
IRON: 63 ug/dL (ref 50–180)
TIBC: 346 ug/dL (ref 250–425)
UIBC: 283 ug/dL (ref 125–400)

## 2015-12-25 LAB — FERRITIN: FERRITIN: 694 ng/mL — AB (ref 22–322)

## 2015-12-25 MED ORDER — FAMOTIDINE 20 MG PO TABS: 20.0000 mg | ORAL_TABLET | Freq: Two times a day (BID) | ORAL | Status: DC

## 2015-12-25 MED ORDER — ESOMEPRAZOLE MAGNESIUM 20 MG PO CPDR: 20.0000 mg | DELAYED_RELEASE_CAPSULE | Freq: Every day | ORAL | Status: DC

## 2015-12-25 NOTE — Progress Notes (Signed)
MRN: [id number] DOB: May 20, 1966  Subjective:   Blake Roberts is a 49 y.o. male presenting for chief complaint of Nausea; Dizziness; and Emesis  Reports 6 week history of intermittent severe nausea with vomiting on occasion, has associated belly pain and light-headedness to the point of having to lay down or take a knee. Episodes have happened randomly but may be associated with meals, episodes occur multiple times a week. Also has urgency to defecate at times right after meals. Of note, patient admits that someone recommended he take aspirin so he has been doing 325mg  daily for ~6 months. He was also taking ibuprofen 800mg  for knee pain several weeks ago. Lastly, patient reports that he is under a lot of stress with finances, divorce and moving. 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 He denies having chest pain, heart racing, palpitations, diaphoresis, neck pain, jaw pain, limb pain, bloody stools, loose stools, constipation, night sweats, weight loss.  Blake Roberts has a current medication list which includes the following prescription(s): amlodipine, aspirin, bupropion, losartan, multiple vitamins-minerals, simvastatin, tadalafil, albuterol, azithromycin, and pseudoephedrine-acetaminophen. Also is allergic to ace inhibitors.  Blake Roberts  has a past medical history of Hyperlipidemia; Hypertension; Acute meniscal tear of knee (RIGHT KNEE); Depression; Anxiety; Bulging lumbar disc (L5 - S1); and Arthritis (RIGHT KNEE). Also  has past surgical history that includes Knee arthroscopy (AGE 74 (APPROX)); Knee arthroscopy (07/13/2012); and torn rotator cuff.  Objective:   Vitals: BP 132/88 mmHg  Pulse 92  Temp(Src) 97.8 F (36.6 C) (Oral)  Resp 14  Ht 5' 10.5" (1.791 m)  Wt 240 lb (108.863 kg)  BMI 33.94 kg/m2  SpO2 98%  Physical Exam  Constitutional: He is oriented to person, place, and time. He appears  well-developed and well-nourished.  HENT:  Mouth/Throat: Oropharynx is clear and moist.  Eyes: No scleral icterus.  Neck: Normal range of motion. Neck supple. No thyromegaly present.  Cardiovascular: Normal rate, regular rhythm and intact distal pulses.  Exam reveals no gallop and no friction rub.   No murmur heard. Pulmonary/Chest: No respiratory distress. He has no wheezes. He has no rales.  Abdominal: Soft. Bowel sounds are normal. He exhibits no distension and no mass. There is no tenderness.  Musculoskeletal: He exhibits no edema.  Neurological: He is alert and oriented to person, place, and time.  Skin: Skin is warm and dry. No rash noted. No erythema. No pallor.   Results for orders placed or performed in visit on 12/25/15 (from the past 24 hour(s))  POCT CBC     Status: Abnormal   Collection Time: 12/25/15  4:56 PM  Result Value Ref Range   WBC 8.6 4.6 - 10.2 K/uL   Lymph, poc 2.5 0.6 - 3.4   POC LYMPH PERCENT 28.8 10 - 50 %L   MID (cbc) 0.3 0 - 0.9   POC MID % 3.3 0 - 12 %M   POC Granulocyte 5.8 2 - 6.9   Granulocyte percent 67.9 37 - 80 %G   RBC 4.38 (A) 4.69 - 6.13 M/uL   Hemoglobin 13.7 (A) 14.1 - 18.1 g/dL   HCT, POC 40.4 (A) 43.5 - 53.7 %   MCV 92.2 80 - 97 fL   MCH, POC 31.3 (A) 27 - 31.2 pg   MCHC 34.0 31.8 - 35.4 g/dL   RDW, POC 13.2 %   Platelet Count, POC 296 142 - 424 K/uL  MPV 6.8 0 - 99.8 fL   Assessment and Plan :   1. Nausea and vomiting, intractability of vomiting not specified, unspecified vomiting type 2. Belly pain 3. Episodic lightheadedness 4. Excessive use of nonsteroidal anti-inflammatory drug (NSAID) 5. Anemia, unspecified - Symptoms may be due to PUD secondary to excessive NSAID use. Anemia may be due to another source but may also be due to GI bleed. Will schedule patient for colonoscopy for screening and diagnostic work up of his new onset anemia. Patient is to start Pepcid and Nexium. Stop NSAID use.  6. Emotional stress - Likely a  component but will work patient up and defer to his psychiatrist for this.  Jaynee Eagles, PA-C [facility] [phone] 12/25/2015 4:17 PM

## 2015-12-25 NOTE — Patient Instructions (Signed)
Peptic Ulcer ° °A peptic ulcer is a sore in the lining of your esophagus (esophageal ulcer), stomach (gastric ulcer), or in the first part of your small intestine (duodenal ulcer). The ulcer causes erosion into the deeper tissue. °CAUSES  °Normally, the lining of the stomach and the small intestine protects itself from the acid that digests food. The protective lining can be damaged by: °· An infection caused by a bacterium called Helicobacter pylori (H. pylori). °· Regular use of nonsteroidal anti-inflammatory drugs (NSAIDs), such as ibuprofen or aspirin. °· Smoking tobacco. °Other risk factors include being older than 50, drinking alcohol excessively, and having a family history of ulcer disease.  °SYMPTOMS  °· Burning pain or gnawing in the area between the chest and the belly button. °· Heartburn. °· Nausea and vomiting. °· Bloating. °The pain can be worse on an empty stomach and at night. If the ulcer results in bleeding, it can cause: °· Black, tarry stools. °· Vomiting of bright red blood. °· Vomiting of coffee-ground-looking materials. °DIAGNOSIS  °A diagnosis is usually made based upon your history and an exam. Other tests and procedures may be performed to find the cause of the ulcer. Finding a cause will help determine the best treatment. Tests and procedures may include: °· Blood tests, stool tests, or breath tests to check for the bacterium H. pylori. °· An upper gastrointestinal (GI) series of the esophagus, stomach, and small intestine. °· An endoscopy to examine the esophagus, stomach, and small intestine. °· A biopsy. °TREATMENT  °Treatment may include: °· Eliminating the cause of the ulcer, such as smoking, NSAIDs, or alcohol. °· Medicines to reduce the amount of acid in your digestive tract. °· Antibiotic medicines if the ulcer is caused by the H. pylori bacterium. °· An upper endoscopy to treat a bleeding ulcer. °· Surgery if the bleeding is severe or if the ulcer created a hole somewhere in the  digestive system. °HOME CARE INSTRUCTIONS  °· Avoid tobacco, alcohol, and caffeine. Smoking can increase the acid in the stomach, and continued smoking will impair the healing of ulcers. °· Avoid foods and drinks that seem to cause discomfort or aggravate your ulcer. °· Only take medicines as directed by your caregiver. Do not substitute over-the-counter medicines for prescription medicines without talking to your caregiver. °· Keep any follow-up appointments and tests as directed. °SEEK MEDICAL CARE IF:  °· Your do not improve within 7 days of starting treatment. °· You have ongoing indigestion or heartburn. °SEEK IMMEDIATE MEDICAL CARE IF:  °· You have sudden, sharp, or persistent abdominal pain. °· You have bloody or dark black, tarry stools. °· You vomit blood or vomit that looks like coffee grounds. °· You become light-headed, weak, or feel faint. °· You become sweaty or clammy. °MAKE SURE YOU:  °· Understand these instructions. °· Will watch your condition. °· Will get help right away if you are not doing well or get worse. °  °This information is not intended to replace advice given to you by your health care provider. Make sure you discuss any questions you have with your health care provider. °  °Document Released: 12/09/2000 Document Revised: 01/02/2015 Document Reviewed: 07/11/2012 °Elsevier Interactive Patient Education ©2016 [employer] ° °

## 2015-12-27 LAB — C-REACTIVE PROTEIN

## 2015-12-28 ENCOUNTER — Telehealth: Payer: Self-pay

## 2015-12-28 NOTE — Telephone Encounter (Signed)
Please advise this patient to return for re-evaluation. Mr. Blake Roberts is here today.

## 2015-12-28 NOTE — Telephone Encounter (Signed)
Pt was seen on 12/30 by [facility] for Nausea and vomiting, intractability of vomiting not specified, unspecified vomiting type - Primary. He has had several episodes of throwing up since his visit. He would like to know if he should come in or get more medication. Please advise at [phone]

## 2015-12-29 ENCOUNTER — Encounter: Payer: Self-pay | Admitting: Urgent Care

## 2015-12-29 NOTE — Telephone Encounter (Signed)
Spoke with pt, advised RTC.

## 2015-12-30 ENCOUNTER — Ambulatory Visit (INDEPENDENT_AMBULATORY_CARE_PROVIDER_SITE_OTHER): Payer: Managed Care, Other (non HMO) | Admitting: Urgent Care

## 2015-12-30 VITALS — BP 150/88 | HR 101 | Temp 97.9°F | Resp 16 | Ht 71.0 in | Wt 236.0 lb

## 2015-12-30 DIAGNOSIS — R739 Hyperglycemia, unspecified: Secondary | ICD-10-CM

## 2015-12-30 DIAGNOSIS — Z8249 Family history of ischemic heart disease and other diseases of the circulatory system: Secondary | ICD-10-CM | POA: Diagnosis not present

## 2015-12-30 DIAGNOSIS — K30 Functional dyspepsia: Secondary | ICD-10-CM

## 2015-12-30 DIAGNOSIS — R42 Dizziness and giddiness: Secondary | ICD-10-CM | POA: Diagnosis not present

## 2015-12-30 DIAGNOSIS — R7303 Prediabetes: Secondary | ICD-10-CM

## 2015-12-30 DIAGNOSIS — R112 Nausea with vomiting, unspecified: Secondary | ICD-10-CM | POA: Diagnosis not present

## 2015-12-30 DIAGNOSIS — D649 Anemia, unspecified: Secondary | ICD-10-CM | POA: Diagnosis not present

## 2015-12-30 DIAGNOSIS — R7309 Other abnormal glucose: Secondary | ICD-10-CM | POA: Diagnosis not present

## 2015-12-30 DIAGNOSIS — R109 Unspecified abdominal pain: Secondary | ICD-10-CM

## 2015-12-30 LAB — POCT CBC
GRANULOCYTE PERCENT: 60 % (ref 37–80)
HEMATOCRIT: 41.4 % — AB (ref 43.5–53.7)
HEMOGLOBIN: 14.3 g/dL (ref 14.1–18.1)
LYMPH, POC: 2.7 (ref 0.6–3.4)
MCH, POC: 31.8 pg — AB (ref 27–31.2)
MCHC: 34.4 g/dL (ref 31.8–35.4)
MCV: 92.3 fL (ref 80–97)
MID (cbc): 0.1 (ref 0–0.9)
MPV: 6.6 fL (ref 0–99.8)
POC GRANULOCYTE: 4.2 (ref 2–6.9)
POC LYMPH PERCENT: 38 %L (ref 10–50)
POC MID %: 2 % (ref 0–12)
Platelet Count, POC: 293 10*3/uL (ref 142–424)
RBC: 4.49 M/uL — AB (ref 4.69–6.13)
RDW, POC: 13.3 %
WBC: 7 10*3/uL (ref 4.6–10.2)

## 2015-12-30 LAB — POCT GLYCOSYLATED HEMOGLOBIN (HGB A1C): Hemoglobin A1C: 5.9

## 2015-12-30 LAB — POCT URINALYSIS DIP (MANUAL ENTRY)
Bilirubin, UA: NEGATIVE
Blood, UA: NEGATIVE
GLUCOSE UA: NEGATIVE
Ketones, POC UA: NEGATIVE
LEUKOCYTES UA: NEGATIVE
Nitrite, UA: NEGATIVE
PROTEIN UA: NEGATIVE
SPEC GRAV UA: 1.02
UROBILINOGEN UA: 0.2
pH, UA: 5

## 2015-12-30 LAB — POC MICROSCOPIC URINALYSIS (UMFC): Mucus: ABSENT

## 2015-12-30 LAB — FERRITIN: Ferritin: 769 ng/mL — ABNORMAL HIGH (ref 22–322)

## 2015-12-30 MED ORDER — ONDANSETRON 4 MG PO TBDP: 4.0000 mg | ORAL_TABLET | Freq: Three times a day (TID) | ORAL | Status: DC | PRN

## 2015-12-30 MED ORDER — ONDANSETRON 4 MG PO TBDP
8.0000 mg | ORAL_TABLET | Freq: Once | ORAL | Status: AC
  Administered 2015-12-30: 8 mg via ORAL

## 2015-12-30 NOTE — Patient Instructions (Signed)

## 2015-12-30 NOTE — Progress Notes (Signed)
MRN: [id number] DOB: Jan 20, 1966  Subjective:   Blake Roberts is a 50 y.o. male presenting for chief complaint of Follow-up  Reports ongoing lightheadedness/dizziness and near syncope, metallic taste which has persisted since his last visit for the same on 12/25/2015. He is still having nausea with dry heaving and some vomiting, intermittent upper abdominal pain, decreased appetite, weight loss, urinary frequency. Admits that his brother has a diagnosis of third degree block that was difficult to identify even with a Holter monitor. He has not started any new medications, foods. He denies fever, chest pain, palpitations, heart racing, diaphoresis, jaw pain, neck pain, loose stools, diarrhea, bloody stools, dysuria, hematuria. Denies smoking cigarettes. Works as a Audiological scientist.  Blake Roberts has a current medication list which includes the following prescription(s): amlodipine, aspirin, bupropion, escitalopram, esomeprazole, famotidine, losartan, multiple vitamins-minerals, simvastatin, and tadalafil. Also is allergic to ace inhibitors.  Blake Roberts  has a past medical history of Hyperlipidemia; Hypertension; Acute meniscal tear of knee (RIGHT KNEE); Depression; Anxiety; Bulging lumbar disc (L5 - S1); and Arthritis (RIGHT KNEE). Also  has past surgical history that includes Knee arthroscopy (AGE 89 (APPROX)); Knee arthroscopy (07/13/2012); and torn rotator cuff.  Objective:   Vitals: BP 150/88 mmHg  Pulse 101  Temp(Src) 97.9 F (36.6 C) (Oral)  Resp 16  Ht _0  (1.803 m)  Wt 236 lb (107.049 kg)  BMI 32.93 kg/m2  SpO2 97%  Wt Readings from Last 3 Encounters:  12/30/15 236 lb (107.049 kg)  12/25/15 240 lb (108.863 kg)  11/28/15 241 lb (109.317 kg)   BP Readings from Last 3 Encounters:  12/30/15 150/88  12/25/15 132/88  11/28/15 136/78   Physical Exam  Constitutional: He is oriented to person, place, and time. He appears well-developed and well-nourished.  HENT:  Mouth/Throat: Oropharynx is  clear and moist.  Eyes: Pupils are equal, round, and reactive to light. No scleral icterus.  Cardiovascular: Normal rate, regular rhythm and intact distal pulses.  Exam reveals no gallop and no friction rub.   No murmur heard. Pulmonary/Chest: No respiratory distress. He has no wheezes. He has no rales.  Abdominal: Soft. Bowel sounds are normal. He exhibits no distension and no mass. There is no tenderness.  Neurological: He is alert and oriented to person, place, and time.  Skin: Skin is warm and dry. No rash noted. No erythema. No pallor.   Results for orders placed or performed in visit on 12/30/15 (from the past 24 hour(s))  POCT CBC     Status: Abnormal   Collection Time: 12/30/15 12:26 PM  Result Value Ref Range   WBC 7.0 4.6 - 10.2 K/uL   Lymph, poc 2.7 0.6 - 3.4   POC LYMPH PERCENT 38.0 10 - 50 %L   MID (cbc) 0.1 0 - 0.9   POC MID % 2.0 0 - 12 %M   POC Granulocyte 4.2 2 - 6.9   Granulocyte percent 60.0 37 - 80 %G   RBC 4.49 (A) 4.69 - 6.13 M/uL   Hemoglobin 14.3 14.1 - 18.1 g/dL   HCT, POC 41.4 (A) 43.5 - 53.7 %   MCV 92.3 80 - 97 fL   MCH, POC 31.8 (A) 27 - 31.2 pg   MCHC 34.4 31.8 - 35.4 g/dL   RDW, POC 13.3 %   Platelet Count, POC 293 142 - 424 K/uL   MPV 6.6 0 - 99.8 fL  POCT glycosylated hemoglobin (Hb A1C)     Status: None   Collection Time: 12/30/15 12:27  PM  Result Value Ref Range   Hemoglobin A1C 5.9   POCT urinalysis dipstick     Status: None   Collection Time: 12/30/15 12:47 PM  Result Value Ref Range   Color, UA yellow yellow   Clarity, UA clear clear   Glucose, UA negative negative   Bilirubin, UA negative negative   Ketones, POC UA negative negative   Spec Grav, UA 1.020    Blood, UA negative negative   pH, UA 5.0    Protein Ur, POC negative negative   Urobilinogen, UA 0.2    Nitrite, UA Negative Negative   Leukocytes, UA Negative Negative  POCT Microscopic Urinalysis ([facility])     Status: None   Collection Time: 12/30/15 12:47 PM  Result Value Ref  Range   WBC,UR,HPF,POC None None WBC/hpf   RBC,UR,HPF,POC None None RBC/hpf   Bacteria None None, Too numerous to count   Mucus Absent Absent   Epithelial Cells, UR Per Microscopy None None, Too numerous to count cells/hpf   Assessment and Plan :   This case was precepted with Dr. Everlene Farrier.  1. Dizziness 2. Family history of abnormal heart rhythm in brother 3. Anemia, unspecified anemia type 4. Nausea and vomiting, intractability of vomiting not specified, unspecified vomiting type 5. Belly pain 6. Elevated blood sugar - Will rule out cardiac source, send to Dr. Einar Gip for Holter monitor. It's possible patient's symptoms are due to an arrhythmia. May be also be worsened by stress he is undergoing in his life.  - Patient still has elevated ferritin with borderline anemia. Normal LFTs, peripheral smear, TIBC, ESR. Will continue to monitor. - Wrote for light duty at work.  7. Pre-diabetes - Counseled on dietary modifications, hold off on exercise for now until we can figure out his dizziness, n/v.  Jaynee Eagles, PA-C [facility] [phone] 12/30/2015 11:45 AM

## 2015-12-31 ENCOUNTER — Encounter: Payer: Self-pay | Admitting: Physician Assistant

## 2015-12-31 LAB — SEDIMENTATION RATE: SED RATE: 11 mm/h (ref 0–15)

## 2015-12-31 LAB — PATHOLOGIST SMEAR REVIEW

## 2016-01-07 ENCOUNTER — Other Ambulatory Visit (INDEPENDENT_AMBULATORY_CARE_PROVIDER_SITE_OTHER): Payer: Managed Care, Other (non HMO)

## 2016-01-07 ENCOUNTER — Other Ambulatory Visit: Payer: Self-pay | Admitting: Emergency Medicine

## 2016-01-07 ENCOUNTER — Encounter: Payer: Self-pay | Admitting: Physician Assistant

## 2016-01-07 ENCOUNTER — Ambulatory Visit (INDEPENDENT_AMBULATORY_CARE_PROVIDER_SITE_OTHER): Payer: Managed Care, Other (non HMO) | Admitting: Physician Assistant

## 2016-01-07 VITALS — BP 148/88 | HR 88 | Temp 98.1°F | Ht 71.0 in | Wt 236.0 lb

## 2016-01-07 DIAGNOSIS — R1310 Dysphagia, unspecified: Secondary | ICD-10-CM

## 2016-01-07 DIAGNOSIS — K589 Irritable bowel syndrome without diarrhea: Secondary | ICD-10-CM | POA: Diagnosis not present

## 2016-01-07 DIAGNOSIS — R112 Nausea with vomiting, unspecified: Secondary | ICD-10-CM

## 2016-01-07 DIAGNOSIS — R1013 Epigastric pain: Secondary | ICD-10-CM

## 2016-01-07 DIAGNOSIS — R7989 Other specified abnormal findings of blood chemistry: Secondary | ICD-10-CM

## 2016-01-07 LAB — IBC PANEL
IRON: 88 ug/dL (ref 42–165)
Saturation Ratios: 21.7 % (ref 20.0–50.0)
TRANSFERRIN: 290 mg/dL (ref 212.0–360.0)

## 2016-01-07 LAB — CBC WITH DIFFERENTIAL/PLATELET
BASOS ABS: 0 10*3/uL (ref 0.0–0.1)
Basophils Relative: 0.6 % (ref 0.0–3.0)
EOS ABS: 0.1 10*3/uL (ref 0.0–0.7)
Eosinophils Relative: 1.5 % (ref 0.0–5.0)
HEMATOCRIT: 42.9 % (ref 39.0–52.0)
HEMOGLOBIN: 14.6 g/dL (ref 13.0–17.0)
LYMPHS PCT: 30.4 % (ref 12.0–46.0)
Lymphs Abs: 2.3 10*3/uL (ref 0.7–4.0)
MCHC: 34 g/dL (ref 30.0–36.0)
MCV: 93.2 fl (ref 78.0–100.0)
MONO ABS: 0.6 10*3/uL (ref 0.1–1.0)
Monocytes Relative: 8.3 % (ref 3.0–12.0)
Neutro Abs: 4.4 10*3/uL (ref 1.4–7.7)
Neutrophils Relative %: 59.2 % (ref 43.0–77.0)
Platelets: 301 10*3/uL (ref 150.0–400.0)
RBC: 4.6 Mil/uL (ref 4.22–5.81)
RDW: 13.3 % (ref 11.5–15.5)
WBC: 7.4 10*3/uL (ref 4.0–10.5)

## 2016-01-07 LAB — PROTIME-INR
INR: 1 ratio (ref 0.8–1.0)
PROTHROMBIN TIME: 10.8 s (ref 9.6–13.1)

## 2016-01-07 LAB — IGA: IGA: 269 mg/dL (ref 68–378)

## 2016-01-07 LAB — COMPREHENSIVE METABOLIC PANEL
ALBUMIN: 4.7 g/dL (ref 3.5–5.2)
ALT: 32 U/L (ref 0–53)
AST: 24 U/L (ref 0–37)
Alkaline Phosphatase: 48 U/L (ref 39–117)
BILIRUBIN TOTAL: 0.5 mg/dL (ref 0.2–1.2)
BUN: 19 mg/dL (ref 6–23)
CALCIUM: 9.7 mg/dL (ref 8.4–10.5)
CHLORIDE: 100 meq/L (ref 96–112)
CO2: 25 meq/L (ref 19–32)
CREATININE: 1.2 mg/dL (ref 0.40–1.50)
GFR: 68.08 mL/min (ref >60.00)
Glucose, Bld: 123 mg/dL — ABNORMAL HIGH (ref 70–99)
Potassium: 4.4 mEq/L (ref 3.5–5.1)
SODIUM: 138 meq/L (ref 135–145)
Total Protein: 7.6 g/dL (ref 6.0–8.3)

## 2016-01-07 LAB — FERRITIN: Ferritin: 598.7 ng/mL — ABNORMAL HIGH (ref 22.0–322.0)

## 2016-01-07 MED ORDER — HYOSCYAMINE SULFATE 0.125 MG SL SUBL: 0.1250 mg | SUBLINGUAL_TABLET | Freq: Four times a day (QID) | SUBLINGUAL | Status: DC | PRN

## 2016-01-07 MED ORDER — ONDANSETRON HCL 8 MG PO TABS: 8.0000 mg | ORAL_TABLET | Freq: Three times a day (TID) | ORAL | Status: DC | PRN

## 2016-01-07 MED ORDER — ESOMEPRAZOLE MAGNESIUM 40 MG PO CPDR: 40.0000 mg | DELAYED_RELEASE_CAPSULE | Freq: Every day | ORAL | Status: DC

## 2016-01-07 NOTE — Progress Notes (Signed)
Agree with assessment and plan. Of note, has this patient had a screening colonoscopy before? If not, given his age and change in bowel habits, would recommend adding colonoscopy to the EGD so we can perform his screening if needed. Thanks

## 2016-01-07 NOTE — Progress Notes (Signed)
Patient ID: Blake Roberts, male   DOB: August 01, 1966, 50 y.o.   MRN: [id number]    HPI:  Blake Roberts is a 50 y.o.   male  referred by Jaynee Eagles, PA-C  For evaluation of epigastric pain, nausea, vomiting, dysphagia , and erratic bowel movements. 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 Over the past several months he has been having episodes of nausea. Initially he thought this was due to stress from becoming separated from his wife. His nausea occurs first thing in the morning and more recently it is occurring in the late afternoon and evening as well it comes in waves. He intermittently vomits bile and undigested food. He has frequent heartburn and was started on Nexium 3 days ago. He has a metallic taste in his mouth. He denies any nocturnal regurgitation or nocturnal cough. He has been having difficulty swallowing solids that has been becoming progressively more severe over the past several months. He has no globus sensation and has no pain when swallowing. He states swallowing pills causes him to have nausea. He has epigastric pain that comes and goes and is worse after meals he feels full sooner than normal and feels very bloated. He belches a lot. He has no radiation of this pain.  He has been using copious amounts of ibuprofen and aspirin for knee pain. He has not had any bright red blood per rectum or melena.    he also reports that for most of his adult life his bowel movements have been irregular. More recently over the past year he often experiences postprandial abdominal cramping followed by an urgent bowel movement at which time his discomfort is relieved. His stools are more often loose for the past few years. His stools are not oily. He recently has lost 9 pounds because he feels full sooner than normal. He states most of his foods don't taste right. He denies a family history of celiac disease and denies a known family history of colon cancer, colon polyps, or  inflammatory bowel disease.    in addition, he states he has been feeling extremely fatigued and dizzy. He has had several syncopal episodes. He was evaluated by [facility] yesterday and had his blood pressure meds adjusted. EKG done yesterday was normal. He has had blood work over the past several months that show a markedly elevated ferritin and was advised to mention that here today. He has a 30 day event monitor that he is wearing now to evaluate for arrhythmic etiology. He was instructed to schedule an echocardiogram to evaluate for possible cardio myopathy.   Past Medical History  Diagnosis Date  . Hyperlipidemia   . Hypertension   . Acute meniscal tear of knee RIGHT KNEE  . Depression   . Anxiety   . Bulging lumbar disc L5 - S1  . Arthritis RIGHT KNEE  . Hemochromatosis     Past Surgical History  Procedure Laterality Date  . Knee arthroscopy  AGE 39 (APPROX)    RIGHT KNEE  . Knee arthroscopy  07/13/2012    Procedure: ARTHROSCOPY KNEE;  Surgeon: Magnus Sinning, MD;  Location: [facility];  Service: Orthopedics;  Laterality: Right;  WITH PARTIAL MEDIAL MENISECTOMY AND REMOVAL OF LOOSE BODIES  . Torn rotator cuff     Family History  Problem Relation Age of Onset  . Cancer Mother   . Diabetes Father   . Heart disease Father   . Hyperlipidemia Father   .  Hypertension Father   . Kidney disease Father    Social History  Substance Use Topics  . Smoking status: Never Smoker   . Smokeless tobacco: Never Used  . Alcohol Use: No     Comment: quit drinking a beer x 1 month   Current Outpatient Prescriptions  Medication Sig Dispense Refill  . amLODipine (NORVASC) 10 MG tablet Take 10 mg by mouth daily.    Marland Kitchen buPROPion (WELLBUTRIN XL) 300 MG 24 hr tablet Take 300 mg by mouth daily.    Marland Kitchen escitalopram (LEXAPRO) 10 MG tablet Take 10 mg by mouth daily.  1  . esomeprazole (NEXIUM) 20 MG capsule Take 1 capsule (20 mg total) by mouth daily before breakfast.  30 capsule 2  . famotidine (PEPCID) 20 MG tablet Take 1 tablet (20 mg total) by mouth 2 (two) times daily. 60 tablet 1  . Multiple Vitamins-Minerals (MULTIVITAMIN & MINERAL PO) Take by mouth.    . ondansetron (ZOFRAN ODT) 4 MG disintegrating tablet Take 1-2 tablets (4-8 mg total) by mouth every 8 (eight) hours as needed for nausea or vomiting. 30 tablet 1  . simvastatin (ZOCOR) 40 MG tablet Take 40 mg by mouth daily.    . tadalafil (CIALIS) 10 MG tablet Take 0.5 tablets (5 mg total) by mouth daily as needed for erectile dysfunction. 10 tablet 9  . valsartan-hydrochlorothiazide (DIOVAN-HCT) 320-12.5 MG tablet Take 1 tablet by mouth daily.    Marland Kitchen esomeprazole (NEXIUM) 40 MG capsule Take 1 capsule (40 mg total) by mouth daily at 12 noon. 30 capsule 3  . hyoscyamine (LEVSIN SL) 0.125 MG SL tablet Place 1 tablet (0.125 mg total) under the tongue every 6 (six) hours as needed for cramping. 120 tablet 3  . ondansetron (ZOFRAN) 8 MG tablet Take 1 tablet (8 mg total) by mouth every 8 (eight) hours as needed for nausea or vomiting. 60 tablet 1   No current facility-administered medications for this visit.   Allergies  Allergen Reactions  . Ace Inhibitors Other (See Comments)    ANGIOEDEMA OF JOINTS- SEVERE     Review of Systems: Gen: Denies any fever, chills, sweats, anorexia, fatigue, weakness, malaise, weight loss, and sleep disorder CV: Denies chest pain, angina, palpitations, syncope, orthopnea, PND, peripheral edema, and claudication. Resp: Denies dyspnea at rest, dyspnea with exercise, cough, sputum, wheezing, coughing up blood, and pleurisy. GI: Denies vomiting blood, jaundice, and fecal incontinence.    Has dysphagia to solids. GU : Denies urinary burning, blood in urine, urinary frequency, urinary hesitancy, nocturnal urination, and urinary incontinence. MS: Denies joint pain, limitation of movement, and swelling, stiffness, low back pain, extremity pain. Denies muscle weakness, cramps,  atrophy.  Derm: Denies rash, itching, dry skin, hives, moles, warts, or unhealing ulcers.  Psych: Denies depression, anxiety, memory loss, suicidal ideation, hallucinations, paranoia, and confusion. Heme: Denies bruising, bleeding, and enlarged lymph nodes. Neuro:  Denies any headaches, dizziness, paresthesias. Endo:  Denies any problems with DM, thyroid, adrenal function     LAB RESULTS:  Recent Labs  01/07/16 0915  WBC 7.4  HGB 14.6  HCT 42.9  PLT 301.0   BMET  Recent Labs  01/07/16 0915  NA 138  K 4.4  CL 100  CO2 25  GLUCOSE 123*  BUN 19  CREATININE 1.20  CALCIUM 9.7   LFT  Recent Labs  01/07/16 0915  PROT 7.6  ALBUMIN 4.7  AST 24  ALT 32  ALKPHOS 48  BILITOT 0.5   PT/INR  Recent Labs  01/07/16 0915  LABPROT 10.8  INR 1.0    ferritin on 12/25/2015 was 694. Ferritin on 12/30/2015 was 769. Irritation today is 598.7. Saturation ratio 21.7 transferrin 290. Iron 88.   metabolic panel today shows an alkaline phosphatase of 48 AST 24 ALT 32 total bili 0.5.      Physical Exam: BP 148/88 mmHg  Pulse 88  Temp(Src) 98.1 F (36.7 C) (Oral)  Ht 5\' 11"  (1.803 Blake)  Wt 236 lb (107.049 kg)  BMI 32.93 kg/m2 Constitutional: Pleasant,well-developed, male in no acute distress. HEENT: Normocephalic and atraumatic. Conjunctivae are normal. No scleral icterus. Neck supple.  No JVD Cardiovascular: Normal rate, regular rhythm.  Pulmonary/chest: Effort normal and breath sounds normal. No wheezing, rales or rhonchi. Abdominal: Soft, nondistended, nontender. Bowel sounds active throughout. There are no masses palpable. No hepatomegaly. Extremities: no edema Lymphadenopathy: No cervical adenopathy noted. Neurological: Alert and oriented to person place and time. Skin: Skin is warm and dry. No rashes noted. Psychiatric: Normal mood and affect. Behavior is normal.  ASSESSMENT AND PLAN:   #1. GERD and epigastric pain. An antireflux regimen has been reviewed. He  will use Nexium 40 mg by mouth every morning. Due to complaints of dysphagia he will be scheduled for barium swallow with tablet. He will then be scheduled for an EGD to assess for esophagitis, gastritis, ulcer, etc.The risks, benefits, and alternatives to endoscopy with possible biopsy and possible dilation were discussed with the patient and they consent to proceed.   A CBC, comprehensive metabolic panel will be obtained. He will also be given a trial of Zofran 8 mg 1 by mouth every 8 hours when necessary nausea.     #2. Iron overload. Patient has had 3 lab draws with a markedly elevated ferritin. He has been scheduled for genetic testing for hemachromatosis. Repeat IBC panel will be obtained along with an IgA, TTG , and hepatic function panel.    #3. Erratic bowel movements. Patient describes postprandial cramping followed by defecation , exacerbated since under stress from recent separation from wife. Blake Roberts likely functional in nature/IBS. Has been advised to adhere to a high-fiber low-fat diet. Will be given a trial of Levsin sublingual 0.125 mg one sublingually every 6 hours when necessary spasm.    further recommendations will be made pending the findings of the above.   Christia Domke, Vita Barley PA-C 01/07/2016, 12:40 PM  CC: Jaynee Eagles, PA-C

## 2016-01-07 NOTE — Patient Instructions (Signed)
It has been recommended you schedule an endoscopy with Dr. Havery Moros. Please call the office to schedule.   Your physician has requested that you go to the basement for lab work before leaving today.  We have sent the following medications to your pharmacy for you to pick up at your convenience: Nexium 40 mg daily (increased your current prescription, so please stop taking those) Levsin 0.125 mg every 6 hours as needed for spasms Zofran 8 mg every 8 hours as needed for nausea.   We have given you a handout on antireflux regimen.   We have put in a referal for oncology. They will call you regarding an appointment.

## 2016-01-08 ENCOUNTER — Ambulatory Visit (AMBULATORY_SURGERY_CENTER): Payer: Self-pay

## 2016-01-08 ENCOUNTER — Telehealth: Payer: Self-pay | Admitting: Emergency Medicine

## 2016-01-08 VITALS — Ht 71.0 in | Wt 245.0 lb

## 2016-01-08 DIAGNOSIS — Z1211 Encounter for screening for malignant neoplasm of colon: Secondary | ICD-10-CM

## 2016-01-08 LAB — TISSUE TRANSGLUTAMINASE, IGA: Tissue Transglutaminase Ab, IgA: 1 U/mL (ref <4)

## 2016-01-08 MED ORDER — SUPREP BOWEL PREP KIT 17.5-3.13-1.6 GM/177ML PO SOLN: 1.0000 | Freq: Once | ORAL | Status: DC

## 2016-01-08 NOTE — Telephone Encounter (Signed)
Left message for patient on his voicemail. Spoke to [place] in Pre visit since patient has appointment today. She will talk to patient about scheduling a double procedure and give him instructions for both.

## 2016-01-08 NOTE — Telephone Encounter (Signed)
-----   Message from Vita Barley Hvozdovic, PA-C sent at 01/07/2016  4:00 PM EST -----  York Cerise, please see office note of today with Dr. Doyne Keel comments. Can u ask patient if he is interested in doing the colonoscopy at the same time as EGD ? If he does his date would need to be changed. If he just wants to get the EGD done at this point keep that appointment and schedule him for a follow-up after the EGD at which time he could discuss colonoscopy

## 2016-01-08 NOTE — Progress Notes (Signed)
No allergies to eggs or soy No diet/weight loss meds No home oxygen No past problems with anesthesia  Has email and internet; registered emmi

## 2016-01-11 ENCOUNTER — Other Ambulatory Visit: Payer: Self-pay | Admitting: Emergency Medicine

## 2016-01-13 ENCOUNTER — Other Ambulatory Visit: Payer: Self-pay | Admitting: Family Medicine

## 2016-01-13 ENCOUNTER — Telehealth: Payer: Self-pay | Admitting: Physician Assistant

## 2016-01-13 NOTE — Telephone Encounter (Signed)
Last two days, worse nausea and has iron, pasty taste in mouth. He is using Nexium and Zofran. He is asking if there is anything else he can do until procedure on Monday. Please, advise.

## 2016-01-13 NOTE — Telephone Encounter (Signed)
He can try increasing nexium to bid.

## 2016-01-13 NOTE — Telephone Encounter (Signed)
Patient given recommendation. 

## 2016-01-15 ENCOUNTER — Telehealth: Payer: Self-pay | Admitting: Gastroenterology

## 2016-01-15 NOTE — Telephone Encounter (Signed)
Call placed to pt. And explained to him that they place air in  The Colon and once you get to recovery he would have to pass air and that you usually don't have a lot of pain after the procedure. Pt. Then wanted to know if he would be loopie after the procedure,I explained to pt.  I would not be able to answer that question because everyone metabolize medication differently,made pt. Aware of sedation that he would be receiving.pt. Also wanted to know about the time of  his appointment that he had at [facility], looked appointment time up and told him that he need to arrive there at least 15-20 minutes early for his appointment,pt. Verbalize understanding.

## 2016-01-15 NOTE — Telephone Encounter (Signed)
Attempted to call pt. To answer his question,went to voicemail and message left.

## 2016-01-18 ENCOUNTER — Ambulatory Visit (AMBULATORY_SURGERY_CENTER): Payer: Managed Care, Other (non HMO) | Admitting: Gastroenterology

## 2016-01-18 ENCOUNTER — Encounter: Payer: Self-pay | Admitting: Gastroenterology

## 2016-01-18 VITALS — BP 114/78 | HR 80 | Temp 96.2°F | Resp 17 | Ht 71.0 in | Wt 245.0 lb

## 2016-01-18 DIAGNOSIS — D123 Benign neoplasm of transverse colon: Secondary | ICD-10-CM | POA: Diagnosis not present

## 2016-01-18 DIAGNOSIS — K317 Polyp of stomach and duodenum: Secondary | ICD-10-CM | POA: Diagnosis not present

## 2016-01-18 DIAGNOSIS — Z1211 Encounter for screening for malignant neoplasm of colon: Secondary | ICD-10-CM | POA: Diagnosis not present

## 2016-01-18 DIAGNOSIS — D128 Benign neoplasm of rectum: Secondary | ICD-10-CM

## 2016-01-18 DIAGNOSIS — D124 Benign neoplasm of descending colon: Secondary | ICD-10-CM | POA: Diagnosis not present

## 2016-01-18 DIAGNOSIS — R1013 Epigastric pain: Secondary | ICD-10-CM

## 2016-01-18 DIAGNOSIS — D129 Benign neoplasm of anus and anal canal: Secondary | ICD-10-CM

## 2016-01-18 MED ORDER — SODIUM CHLORIDE 0.9 % IV SOLN: 500.0000 mL | INTRAVENOUS | Status: DC

## 2016-01-18 NOTE — Patient Instructions (Signed)
AVOID NSAIDS (MOTRIN, ADVIL, IBUPROFEN, ALEVE, NAPROSYN ETC) FOR TWO WEEKS, February 01, 2016.   YOU HAD AN ENDOSCOPIC PROCEDURE TODAY AT [facility]:   Refer to the procedure report that was given to you for any specific questions about what was found during the examination.  If the procedure report does not answer your questions, please call your gastroenterologist to clarify.  If you requested that your care partner not be given the details of your procedure findings, then the procedure report has been included in a sealed envelope for you to review at your convenience later.  YOU SHOULD EXPECT: Some feelings of bloating in the abdomen. Passage of more gas than usual.  Walking can help get rid of the air that was put into your GI tract during the procedure and reduce the bloating. If you had a lower endoscopy (such as a colonoscopy or flexible sigmoidoscopy) you may notice spotting of blood in your stool or on the toilet paper. If you underwent a bowel prep for your procedure, you may not have a normal bowel movement for a few days.  Please Note:  You might notice some irritation and congestion in your nose or some drainage.  This is from the oxygen used during your procedure.  There is no need for concern and it should clear up in a day or so.  SYMPTOMS TO REPORT IMMEDIATELY:   Following lower endoscopy (colonoscopy or flexible sigmoidoscopy):  Excessive amounts of blood in the stool  Significant tenderness or worsening of abdominal pains  Swelling of the abdomen that is new, acute  Fever of 100F or higher   Following upper endoscopy (EGD)  Vomiting of blood or coffee ground material  New chest pain or pain under the shoulder blades  Painful or persistently difficult swallowing  New shortness of breath  Fever of 100F or higher  Black, tarry-looking stools  For urgent or emergent issues, a gastroenterologist can be reached at any hour by calling [phone]  [phone].   DIET: Your first meal following the procedure should be a small meal and then it is ok to progress to your normal diet. Heavy or fried foods are harder to digest and may make you feel nauseous or bloated.  Likewise, meals heavy in dairy and vegetables can increase bloating.  Drink plenty of fluids but you should avoid alcoholic beverages for 24 hours.  ACTIVITY:  You should plan to take it easy for the rest of today and you should NOT DRIVE or use heavy machinery until tomorrow (because of the sedation medicines used during the test).    FOLLOW UP: Our staff will call the number listed on your records the next business day following your procedure to check on you and address any questions or concerns that you may have regarding the information given to you following your procedure. If we do not reach you, we will leave a message.  However, if you are feeling well and you are not experiencing any problems, there is no need to return our call.  We will assume that you have returned to your regular daily activities without incident.  If any biopsies were taken you will be contacted by phone or by letter within the next 1-3 weeks.  Please call us at [phone] if you have not heard about the biopsies in 3 weeks.    SIGNATURES/CONFIDENTIALITY: You and/or your care partner have signed paperwork which will be entered into your electronic medical record.  These signatures attest  to the fact that that the information above on your After Visit Summary has been reviewed and is understood.  Full responsibility of the confidentiality of this discharge information lies with you and/or your care-partner. 

## 2016-01-18 NOTE — Progress Notes (Signed)
Called to room to assist during endoscopic procedure.  Patient ID and intended procedure confirmed with present staff. Received instructions for my participation in the procedure from the performing physician.  

## 2016-01-18 NOTE — Op Note (Signed)
[place]  [address] [place], [id number]   COLONOSCOPY PROCEDURE REPORT  PATIENT: Blake Roberts, Blake Roberts  MR#: [id number] BIRTHDATE: [birth year] , 49  yrs. old GENDER: male ENDOSCOPIST: Yetta Flock, MD REFERRED BY: PROCEDURE DATE:  01/18/2016 PROCEDURE:   Colonoscopy, screening and Colonoscopy with snare polypectomy First Screening Colonoscopy - Avg.  risk and is 50 yrs.  old or older Yes.  Prior Negative Screening - Now for repeat screening. N/A  History of Adenoma - Now for follow-up colonoscopy & has been > or = to 3 yrs.  N/A  Polyps removed today? Yes ASA CLASS:   Class II INDICATIONS:Screening for colonic neoplasia and Colorectal Neoplasm Risk Assessment for this procedure is average risk. MEDICATIONS: Propofol 650 mg IV  DESCRIPTION OF PROCEDURE:   After the risks benefits and alternatives of the procedure were thoroughly explained, informed consent was obtained.  The digital rectal exam revealed no abnormalities of the rectum.   The [id number]  endoscope was introduced through the anus and advanced to the cecum, which was identified by both the appendix and ileocecal valve. No adverse events experienced.   The quality of the prep was adequate  The instrument was then slowly withdrawn as the colon was fully examined. Estimated blood loss is zero unless otherwise noted in this procedure report.    COLON FINDINGS: Three x 5-77mm sessile polyps were noted in the transverse colon and removed via cold snare.  A 40mm sessile descending colon polyp was noted and removed with cold snare.  Two 22mm sessile rectal polyps were noted and removed via cold snare. Mild diverticulosis was noted in the sigmoid colon.  The remainder of the exam was normal.  Retroflexed views revealed internal hemorrhoids. The time to cecum = 3.6 Withdrawal time = 23.4   The scope was withdrawn and the procedure completed. COMPLICATIONS: There were no immediate  complications.  ENDOSCOPIC IMPRESSION: 6 small polyps removed as above Mild diverticulosis Internal hemorrhoids  RECOMMENDATIONS: Await pathology results Resume diet Resume medications No NSAIDs for 2 weeks  eSigned:  Yetta Flock, MD 01/18/2016 4:51 PM   cc:  the patient   PATIENT NAME:  Shahil, Granado MR#: [id number]

## 2016-01-18 NOTE — Op Note (Signed)
[facility]  [address] [place], [id number]   ENDOSCOPY PROCEDURE REPORT  PATIENT: Blake Roberts, Blake Roberts  MR#: [id number] BIRTHDATE: [birth year] , 49  yrs. old GENDER: male ENDOSCOPIST: Yetta Flock, MD REFERRED BY: PROCEDURE DATE:  01/18/2016 PROCEDURE:  EGD w/ biopsy ASA CLASS:     Class II INDICATIONS:  nausea and dyspepsia. MEDICATIONS: Propofol 500 mg IV TOPICAL ANESTHETIC:  DESCRIPTION OF PROCEDURE: After the risks benefits and alternatives of the procedure were thoroughly explained, informed consent was obtained.  The [id number] endoscope was introduced through the mouth and advanced to the second portion of the duodenum , Without limitations.  The instrument was slowly withdrawn as the mucosa was fully examined.  The esophagus was normal in appearance.  The DH, GEJ, and SCJ were located 42cm from the incisors.  There was a polypoid nodule / prominent fold at the GEJ.  This could represent an inflammatory polypoid changes but biopsies were taken.  The stomach was remarkable for an erythematous gastric body polyp roughly 3-73mm in size and was removed with cold forceps.  Patchy mild erythema without ulceration or erosion was noted throughout the gastric body and antrum.  Biopsies were taken to rule out H pylori.  The remainder of the stomach was normal.  The dudodenal bulb and 2nd portion of the duodenum were normal in appearance, biopsies taken to rule out celiac disease.  Retroflexed views revealed no abnormalities.     The scope was then withdrawn from the patient and the procedure completed.  COMPLICATIONS: There were no immediate complications.  ENDOSCOPIC IMPRESSION: Normal esophagus Prominent fold vs. polypoid lesion at the GEJ - biopsies obtained Benign appearing gastric polyp, removed with cold forceps Patchy erythematous gastritis, biopsied Normal duodenum, biopsied obtained  RECOMMENDATIONS: Await pathology results Avoid  NSAIDs if possible Resume diet Resume medications (nexium)  eSigned:  Yetta Flock, MD 01/18/2016 4:58 PM  CC: the patient

## 2016-01-18 NOTE — Progress Notes (Signed)
A/ox3 pleased with MAC, report to Karen RN 

## 2016-01-19 ENCOUNTER — Telehealth: Payer: Self-pay

## 2016-01-19 NOTE — Telephone Encounter (Signed)
  Follow up Call-  Call back number 01/18/2016  Post procedure Call Back phone  # [phone]  Permission to leave phone message Yes    Patient was called for follow up after procedure on 01/18/2016. No answer at the number given for follow up. A message was left on his answering machine.

## 2016-01-20 ENCOUNTER — Ambulatory Visit (HOSPITAL_COMMUNITY)
Admission: RE | Admit: 2016-01-20 | Discharge: 2016-01-20 | Disposition: A | Discharge status: Home / Self Care | Payer: Managed Care, Other (non HMO) | Source: Ambulatory Visit | Attending: Physician Assistant | Admitting: Physician Assistant

## 2016-01-20 ENCOUNTER — Ambulatory Visit (HOSPITAL_COMMUNITY): Payer: Managed Care, Other (non HMO)

## 2016-01-20 DIAGNOSIS — R110 Nausea: Secondary | ICD-10-CM | POA: Diagnosis not present

## 2016-01-20 DIAGNOSIS — R1310 Dysphagia, unspecified: Secondary | ICD-10-CM | POA: Diagnosis present

## 2016-01-20 DIAGNOSIS — R42 Dizziness and giddiness: Secondary | ICD-10-CM | POA: Diagnosis not present

## 2016-01-21 ENCOUNTER — Other Ambulatory Visit: Payer: Self-pay | Admitting: Family Medicine

## 2016-02-02 ENCOUNTER — Encounter: Payer: Self-pay | Admitting: Family Medicine

## 2016-03-14 ENCOUNTER — Encounter: Payer: Self-pay | Admitting: Hematology

## 2016-03-14 ENCOUNTER — Telehealth: Payer: Self-pay | Admitting: Hematology

## 2016-03-14 NOTE — Telephone Encounter (Signed)
Faxed np letter to the referring office to contact pt with appt. Mailed np packet  Dx-elevated ferritin Referring Dr Nadyne Coombes

## 2016-03-29 ENCOUNTER — Ambulatory Visit: Payer: Managed Care, Other (non HMO) | Admitting: Hematology

## 2016-03-30 ENCOUNTER — Ambulatory Visit (INDEPENDENT_AMBULATORY_CARE_PROVIDER_SITE_OTHER): Payer: Managed Care, Other (non HMO) | Admitting: Physician Assistant

## 2016-03-30 ENCOUNTER — Ambulatory Visit (INDEPENDENT_AMBULATORY_CARE_PROVIDER_SITE_OTHER): Payer: Managed Care, Other (non HMO)

## 2016-03-30 VITALS — BP 124/78 | HR 90 | Temp 98.2°F | Resp 20 | Ht 71.0 in | Wt 232.0 lb

## 2016-03-30 DIAGNOSIS — R05 Cough: Secondary | ICD-10-CM

## 2016-03-30 DIAGNOSIS — R062 Wheezing: Secondary | ICD-10-CM | POA: Diagnosis not present

## 2016-03-30 DIAGNOSIS — R059 Cough, unspecified: Secondary | ICD-10-CM

## 2016-03-30 DIAGNOSIS — R61 Generalized hyperhidrosis: Secondary | ICD-10-CM

## 2016-03-30 LAB — POCT CBC
Granulocyte percent: 61.7 %G (ref 37–80)
HEMATOCRIT: 40.4 % — AB (ref 43.5–53.7)
Hemoglobin: 14.4 g/dL (ref 14.1–18.1)
LYMPH, POC: 3.1 (ref 0.6–3.4)
MCH, POC: 32.5 pg — AB (ref 27–31.2)
MCHC: 35.5 g/dL — AB (ref 31.8–35.4)
MCV: 91.4 fL (ref 80–97)
MID (cbc): 0.7 (ref 0–0.9)
MPV: 6.9 fL (ref 0–99.8)
PLATELET COUNT, POC: 320 10*3/uL (ref 142–424)
POC Granulocyte: 6.1 (ref 2–6.9)
POC LYMPH %: 31.1 % (ref 10–50)
POC MID %: 7.2 %M (ref 0–12)
RBC: 4.42 M/uL — AB (ref 4.69–6.13)
RDW, POC: 12.5 %
WBC: 9.9 10*3/uL (ref 4.6–10.2)

## 2016-03-30 MED ORDER — IPRATROPIUM BROMIDE 0.02 % IN SOLN
0.5000 mg | Freq: Once | RESPIRATORY_TRACT | Status: AC
  Administered 2016-03-30: 0.5 mg via RESPIRATORY_TRACT

## 2016-03-30 MED ORDER — METHYLPREDNISOLONE SODIUM SUCC 125 MG IJ SOLR
125.0000 mg | Freq: Once | INTRAMUSCULAR | Status: AC
  Administered 2016-03-30: 125 mg via INTRAMUSCULAR

## 2016-03-30 MED ORDER — ALBUTEROL SULFATE (2.5 MG/3ML) 0.083% IN NEBU
2.5000 mg | INHALATION_SOLUTION | Freq: Once | RESPIRATORY_TRACT | Status: AC
  Administered 2016-03-30: 2.5 mg via RESPIRATORY_TRACT

## 2016-03-30 MED ORDER — PREDNISONE 20 MG PO TABS: ORAL_TABLET | ORAL | Status: AC

## 2016-03-30 MED ORDER — DEXTROMETHORPHAN POLISTIREX ER 30 MG/5ML PO SUER: 30.0000 mg | Freq: Two times a day (BID) | ORAL | Status: DC

## 2016-03-30 MED ORDER — AZITHROMYCIN 250 MG PO TABS: ORAL_TABLET | ORAL | Status: DC

## 2016-03-30 NOTE — Progress Notes (Signed)
03/30/2016 4:06 PM   DOB: [birth year] / MRN: [id number]  SUBJECTIVE:  Blake Roberts is a 50 y.o. male presenting for cough that has been present for roughly 2 weeks now.  Associates nasal congestion and sore throat.  Complains of some sneezing, however denies eye, ear and mouth irritation.  Feels that he is getting worse.  Has tried several OTC preps without relief. Denies GERD like symptoms.  Has a history of hemochromatosis and has had a full cardiac eval which was negative.  He is a paramedic.   Immunization History  Administered Date(s) Administered  . PPD Test 05/14/2013   He is allergic to ace inhibitors.   He  has a past medical history of Hyperlipidemia; Hypertension; Acute meniscal tear of knee (RIGHT KNEE); Depression; Anxiety; Bulging lumbar disc (L5 - S1); Arthritis (RIGHT KNEE); and Hemochromatosis.    He  reports that he has never smoked. He has never used smokeless tobacco. He reports that he drinks about 1.2 - 2.4 oz of alcohol per week. He reports that he does not use illicit drugs. He  reports that he currently engages in sexual activity. The patient  has past surgical history that includes Knee arthroscopy (AGE 16 (APPROX)); Knee arthroscopy (07/13/2012); and torn rotator cuff.  His family history includes Cancer in his mother; Diabetes in his father; Heart disease in his father; Hyperlipidemia in his father; Hypertension in his father; Kidney disease in his father. There is no history of Colon cancer.  Review of Systems  Constitutional: Positive for chills and diaphoresis (night). Negative for fever.  Respiratory: Positive for cough, sputum production and wheezing. Negative for shortness of breath.   Cardiovascular: Negative for chest pain and leg swelling.  Gastrointestinal: Negative for nausea.  Genitourinary: Negative.   Musculoskeletal: Negative for myalgias.  Skin: Negative for rash.  Neurological: Negative for dizziness, weakness and headaches.    Problem list  and medications reviewed and updated by myself where necessary, and exist elsewhere in the encounter.   OBJECTIVE:  BP 124/78 mmHg  Pulse 90  Temp(Src) 98.2 F (36.8 C) (Oral)  Resp 20  Ht 5\' 11"  (1.803 m)  Wt 232 lb (105.235 kg)  BMI 32.37 kg/m2  SpO2 98%  Physical Exam  Constitutional: He is oriented to person, place, and time. He appears well-developed. He does not appear ill.  Eyes: Conjunctivae and EOM are normal. Pupils are equal, round, and reactive to light.  Cardiovascular: Normal rate and regular rhythm.   Pulmonary/Chest: Effort normal.  Abdominal: He exhibits no distension.  Musculoskeletal: Normal range of motion.  Neurological: He is alert and oriented to person, place, and time. No cranial nerve deficit. Coordination normal.  Skin: Skin is warm and dry. He is not diaphoretic.  Psychiatric: He has a normal mood and affect.  Nursing note and vitals reviewed.   Results for orders placed or performed in visit on 03/30/16 (from the past 72 hour(s))  POCT CBC     Status: Abnormal   Collection Time: 03/30/16  4:04 PM  Result Value Ref Range   WBC 9.9 4.6 - 10.2 K/uL   Lymph, poc 3.1 0.6 - 3.4   POC LYMPH PERCENT 31.1 10 - 50 %L   MID (cbc) 0.7 0 - 0.9   POC MID % 7.2 0 - 12 %M   POC Granulocyte 6.1 2 - 6.9   Granulocyte percent 61.7 37 - 80 %G   RBC 4.42 (A) 4.69 - 6.13 M/uL   Hemoglobin 14.4 14.1 -  18.1 g/dL   HCT, POC 40.4 (A) 43.5 - 53.7 %   MCV 91.4 80 - 97 fL   MCH, POC 32.5 (A) 27 - 31.2 pg   MCHC 35.5 (A) 31.8 - 35.4 g/dL   RDW, POC 12.5 %   Platelet Count, POC 320 142 - 424 K/uL   MPV 6.9 0 - 99.8 fL   CBC Latest Ref Rng 03/30/2016 01/07/2016 12/30/2015  WBC 4.6 - 10.2 K/uL 9.9 7.4 7.0  Hemoglobin 14.1 - 18.1 g/dL 14.4 14.6 14.3  Hematocrit 43.5 - 53.7 % 40.4(A) 42.9 41.4(A)  Platelets 150.0 - 400.0 K/uL - 301.0 -      Dg Chest 2 View  03/30/2016  CLINICAL DATA:  Shortness of breath, crackles at the left lung base, wheezing EXAM: CHEST  2 VIEW  COMPARISON:  None. FINDINGS: No active infiltrate or effusion is seen. Mediastinal and hilar contours are unremarkable. The heart is within normal limits in size. There are degenerative changes in the mid to lower thoracic spine. IMPRESSION: No active cardiopulmonary disease. Electronically Signed   By: Ivar Drape M.D.   On: 03/30/2016 15:31    ASSESSMENT AND PLAN  Jeru was seen today for cough and sore throat.  Diagnoses and all orders for this visit:  Cough: Rads clear.  I am worried about CAP.  Will treat.  Given his wheezing and no history of this will provide 125 solumedrol and start a dose pack tomorrow.   -     DG Chest 2 View; Future -     POCT CBC -     dextromethorphan (DELSYM) 30 MG/5ML liquid; Take 5 mLs (30 mg total) by mouth 2 (two) times daily.  Wheezing -     albuterol (PROVENTIL) (2.5 MG/3ML) 0.083% nebulizer solution 2.5 mg; Take 3 mLs (2.5 mg total) by nebulization once. -     ipratropium (ATROVENT) nebulizer solution 0.5 mg; Take 2.5 mLs (0.5 mg total) by nebulization once. -     predniSONE (DELTASONE) 20 MG tablet; Take 3 in the morning for 3 days, then 2 in the morning for 3 days, and then 1 in the morning for 3 days. -     methylPREDNISolone sodium succinate (SOLU-MEDROL) 125 mg/2 mL injection 125 mg; Inject 2 mLs (125 mg total) into the muscle once. -     albuterol (PROVENTIL) (2.5 MG/3ML) 0.083% nebulizer solution 2.5 mg; Take 3 mLs (2.5 mg total) by nebulization once.  Night sweats -     azithromycin (ZITHROMAX) 250 MG tablet; Tke two daily until finished.    The patient was advised to call or return to clinic if he does not see an improvement in symptoms or to seek the care of the closest emergency department if he worsens with the above plan.   Philis Fendt, MHS, PA-C [facility] 03/30/2016 4:06 PM

## 2016-03-30 NOTE — Patient Instructions (Signed)
     IF you received an x-ray today, you will receive an invoice from [facility]. Please contact [facility] at [phone] with questions or concerns regarding your invoice.   IF you received labwork today, you will receive an invoice from [facility]. Please contact [facility] at [phone] with questions or concerns regarding your invoice.   Our billing staff will not be able to assist you with questions regarding bills from these companies.  You will be contacted with the lab results as soon as they are available. The fastest way to get your results is to activate your My Chart account. Instructions are located on the last page of this paperwork. If you have not heard from us regarding the results in 2 weeks, please contact this office.      

## 2016-05-24 ENCOUNTER — Ambulatory Visit (INDEPENDENT_AMBULATORY_CARE_PROVIDER_SITE_OTHER): Payer: Self-pay | Admitting: Family Medicine

## 2016-05-24 VITALS — BP 132/88 | HR 97 | Temp 98.1°F | Resp 18 | Ht 71.0 in | Wt 237.2 lb

## 2016-05-24 DIAGNOSIS — IMO0001 Reserved for inherently not codable concepts without codable children: Secondary | ICD-10-CM

## 2016-05-24 DIAGNOSIS — R030 Elevated blood-pressure reading, without diagnosis of hypertension: Secondary | ICD-10-CM

## 2016-05-24 NOTE — Assessment & Plan Note (Signed)
Previously has been on Valsartan per cardiology.  Borderline hypotensive previously.  Norvasc 10 mg daily currently.   - Recommend home monitoring with double to triple readings daily x 2 wks.   - Lab work from January showing slightly elevated SCr which has been stable.  - Consider ACE/ARB if need to add anything

## 2016-05-24 NOTE — Patient Instructions (Signed)
     IF you received an x-ray today, you will receive an invoice from [facility]. Please contact [facility] at [phone] with questions or concerns regarding your invoice.   IF you received labwork today, you will receive an invoice from [facility]. Please contact [facility] at [phone] with questions or concerns regarding your invoice.   Our billing staff will not be able to assist you with questions regarding bills from these companies.  You will be contacted with the lab results as soon as they are available. The fastest way to get your results is to activate your My Chart account. Instructions are located on the last page of this paperwork. If you have not heard from us regarding the results in 2 weeks, please contact this office.      

## 2016-05-24 NOTE — Progress Notes (Signed)
Blake Roberts is a 50 y.o. male who presents today for elevated blood pressure.  Blood pressure elevation - Previously tx for this with valsartan and norvasc.  Currently only on Norvasc, denies SE or ADR.  No HA, blurred vision, diplopia, CP/SOB.  Was told by his employer to come for check as they had measured this to be high around 150/100 about one week ago.    Past Medical History  Diagnosis Date  . Hyperlipidemia   . Hypertension   . Acute meniscal tear of knee RIGHT KNEE  . Depression   . Anxiety   . Bulging lumbar disc L5 - S1  . Arthritis RIGHT KNEE  . Hemochromatosis     History  Smoking status  . Never Smoker   Smokeless tobacco  . Never Used    Family History  Problem Relation Age of Onset  . Cancer Mother   . Diabetes Father   . Heart disease Father   . Hyperlipidemia Father   . Hypertension Father   . Kidney disease Father   . Colon cancer Neg Hx     Current Outpatient Prescriptions on File Prior to Visit  Medication Sig Dispense Refill  . amLODipine (NORVASC) 10 MG tablet Take 10 mg by mouth daily.    Marland Kitchen buPROPion (WELLBUTRIN XL) 300 MG 24 hr tablet Take 300 mg by mouth daily.    . Multiple Vitamins-Minerals (MULTIVITAMIN & MINERAL PO) Take by mouth. Reported on 01/18/2016    . ondansetron (ZOFRAN) 8 MG tablet Take 1 tablet (8 mg total) by mouth every 8 (eight) hours as needed for nausea or vomiting. 60 tablet 1  . rosuvastatin (CRESTOR) 20 MG tablet Take 20 mg by mouth daily.    . tadalafil (CIALIS) 10 MG tablet Take 0.5 tablets (5 mg total) by mouth daily as needed for erectile dysfunction. 10 tablet 9  . escitalopram (LEXAPRO) 10 MG tablet Take 10 mg by mouth daily. Reported on 05/24/2016  1  . esomeprazole (NEXIUM) 40 MG capsule Take 1 capsule (40 mg total) by mouth daily at 12 noon. (Patient not taking: Reported on 03/30/2016) 30 capsule 3  . famotidine (PEPCID) 20 MG tablet Take 1 tablet (20 mg total) by mouth 2 (two) times daily. (Patient not taking:  Reported on 03/30/2016) 60 tablet 1  . hyoscyamine (LEVSIN SL) 0.125 MG SL tablet Place 1 tablet (0.125 mg total) under the tongue every 6 (six) hours as needed for cramping. (Patient not taking: Reported on 01/18/2016) 120 tablet 3  . ondansetron (ZOFRAN ODT) 4 MG disintegrating tablet Take 1-2 tablets (4-8 mg total) by mouth every 8 (eight) hours as needed for nausea or vomiting. (Patient not taking: Reported on 03/30/2016) 30 tablet 1  . simvastatin (ZOCOR) 40 MG tablet Take 40 mg by mouth daily. Reported on 05/24/2016    . valsartan-hydrochlorothiazide (DIOVAN-HCT) 320-12.5 MG tablet Take 1 tablet by mouth daily. Reported on 05/24/2016     No current facility-administered medications on file prior to visit.    ROS: Per HPI.  All other systems reviewed and are negative.   Physical Exam Filed Vitals:   05/24/16 1516  BP: 132/88  Pulse: 97  Temp: 98.1 F (36.7 C)  Resp: 18    Physical Examination: General appearance - alert, well appearing, and in no distress Chest - clear to auscultation, no wheezes, rales or rhonchi, symmetric air entry Heart - normal rate and regular rhythm    Chemistry      Component Value Date/Time  NA 138 01/07/2016 0915   K 4.4 01/07/2016 0915   CL 100 01/07/2016 0915   CO2 25 01/07/2016 0915   BUN 19 01/07/2016 0915   CREATININE 1.20 01/07/2016 0915   CREATININE 1.27 12/25/2015 1649      Component Value Date/Time   CALCIUM 9.7 01/07/2016 0915   ALKPHOS 48 01/07/2016 0915   AST 24 01/07/2016 0915   ALT 32 01/07/2016 0915   BILITOT 0.5 01/07/2016 0915

## 2016-07-09 DIAGNOSIS — F339 Major depressive disorder, recurrent, unspecified: Secondary | ICD-10-CM | POA: Diagnosis not present

## 2016-07-11 ENCOUNTER — Ambulatory Visit (INDEPENDENT_AMBULATORY_CARE_PROVIDER_SITE_OTHER): Payer: 59 | Admitting: Family Medicine

## 2016-07-11 ENCOUNTER — Encounter: Payer: Self-pay | Admitting: Hematology

## 2016-07-11 VITALS — BP 138/86 | HR 94 | Temp 98.0°F | Resp 17 | Ht 71.0 in | Wt 233.2 lb

## 2016-07-11 DIAGNOSIS — R112 Nausea with vomiting, unspecified: Secondary | ICD-10-CM

## 2016-07-11 DIAGNOSIS — I10 Essential (primary) hypertension: Secondary | ICD-10-CM

## 2016-07-11 LAB — COMPREHENSIVE METABOLIC PANEL
ALT: 22 U/L (ref 9–46)
AST: 21 U/L (ref 10–35)
Albumin: 5 g/dL (ref 3.6–5.1)
Alkaline Phosphatase: 53 U/L (ref 40–115)
BUN: 14 mg/dL (ref 7–25)
CHLORIDE: 102 mmol/L (ref 98–110)
CO2: 24 mmol/L (ref 20–31)
Calcium: 9.5 mg/dL (ref 8.6–10.3)
Creat: 1.25 mg/dL (ref 0.70–1.33)
Glucose, Bld: 121 mg/dL — ABNORMAL HIGH (ref 65–99)
POTASSIUM: 4.4 mmol/L (ref 3.5–5.3)
Sodium: 139 mmol/L (ref 135–146)
TOTAL PROTEIN: 7.7 g/dL (ref 6.1–8.1)
Total Bilirubin: 0.8 mg/dL (ref 0.2–1.2)

## 2016-07-11 LAB — POCT CBC
GRANULOCYTE PERCENT: 66.3 % (ref 37–80)
HEMATOCRIT: 42 % — AB (ref 43.5–53.7)
HEMOGLOBIN: 14.8 g/dL (ref 14.1–18.1)
Lymph, poc: 2.2 (ref 0.6–3.4)
MCH: 32 pg — AB (ref 27–31.2)
MCHC: 35.2 g/dL (ref 31.8–35.4)
MCV: 90.8 fL (ref 80–97)
MID (cbc): 0.5 (ref 0–0.9)
MPV: 7 fL (ref 0–99.8)
POC GRANULOCYTE: 5.2 (ref 2–6.9)
POC LYMPH %: 27.8 % (ref 10–50)
POC MID %: 5.9 %M (ref 0–12)
Platelet Count, POC: 287 10*3/uL (ref 142–424)
RBC: 4.63 M/uL — AB (ref 4.69–6.13)
RDW, POC: 12.7 %
WBC: 7.9 10*3/uL (ref 4.6–10.2)

## 2016-07-11 LAB — FERRITIN: FERRITIN: 685 ng/mL — AB (ref 20–380)

## 2016-07-11 MED ORDER — ONDANSETRON 8 MG PO TBDP: 8.0000 mg | ORAL_TABLET | Freq: Three times a day (TID) | ORAL | Status: DC | PRN

## 2016-07-11 MED ORDER — VALSARTAN 160 MG PO TABS: 160.0000 mg | ORAL_TABLET | Freq: Every day | ORAL | Status: DC

## 2016-07-11 NOTE — Patient Instructions (Addendum)
Resume Diovan 160/12.5 for hypertension management. Oncology referral placed and their office will follow-up with you regarding appointment.  IF you received an x-ray today, you will receive an invoice from [facility]. Please contact [facility] at [phone] with questions or concerns regarding your invoice.   IF you received labwork today, you will receive an invoice from [facility]. Please contact [facility] at [phone] with questions or concerns regarding your invoice.   Our billing staff will not be able to assist you with questions regarding bills from these companies.  You will be contacted with the lab results as soon as they are available. The fastest way to get your results is to activate your My Chart account. Instructions are located on the last page of this paperwork. If you have not heard from [place] regarding the results in 2 weeks, please contact this office.

## 2016-07-11 NOTE — Progress Notes (Signed)
Patient ID: Blake Roberts, male    DOB: [birth year], 50 y.o.   MRN: [id number]  PCP: Wendie Agreste, MD  Chief Complaint  Patient presents with  . Vomiting    nausea, mild diarrhea. started about lunch time yesterday     Subjective:   HPI Presents for evaluation of nausea with vomiting  times 1 day.  Reports eating a very heavy meal at [facility].   Later in the day, he began to experience persistent vomiting. Unable to tolerate fluids or solids. Denies recent out of country travel.  Denies fever, diarrhea, or abdominal pain. Described abdominal discomfort or "sick stomach" feeling. Describes a metallic taste in mouth since vomiting episodes. No medication taken for nausea.   2. Hematochromatosis  Patient describes a similar nausea episode occurred late last year.  During the work-up, it was determined that he had an elevated ferritin level.  After further evaluation by GI and Cardiology, he was diagnosed with hematochromatosis.  He was referred to oncology but never seen due to loosing his job and benefits.  He is now insured and request a referral be sent over to [facility].  3. Hypertension Patient reports checking blood pressure daily and noticing over the last two months an increase in his diastolic readings mid-upper [id number] and systolic hovering around [id number].  He is concerned and would like to know if he should restart his Diovan.  He originally stopped taking Diovan after speaking with cardiology regarding his blood pressure reading being controlled at that time.     Review of Systems  Constitutional: Positive for appetite change and fatigue.  Cardiovascular: Negative.   Gastrointestinal: Positive for nausea. Negative for diarrhea.  Skin: Negative.        Patient Active Problem List   Diagnosis Date Noted  . Hemochromatosis 07/11/2016  . Elevated blood pressure 05/24/2016  . Episodic lightheadedness 12/25/2015  . Belly pain 12/25/2015   . Anemia, unspecified 12/25/2015     Prior to Admission medications   Medication Sig Start Date End Date Taking? Authorizing Provider  amLODipine (NORVASC) 10 MG tablet Take 10 mg by mouth daily.   Yes Historical Provider, MD  buPROPion (WELLBUTRIN XL) 300 MG 24 hr tablet Take 300 mg by mouth daily.   Yes Historical Provider, MD  Multiple Vitamins-Minerals (MULTIVITAMIN & MINERAL PO) Take by mouth. Reported on 01/18/2016   Yes Historical Provider, MD  ondansetron (ZOFRAN) 8 MG tablet Take 1 tablet (8 mg total) by mouth every 8 (eight) hours as needed for nausea or vomiting. 01/07/16  Yes Lori P Hvozdovic, PA-C  rosuvastatin (CRESTOR) 20 MG tablet Take 20 mg by mouth daily.   Yes Historical Provider, MD  tadalafil (CIALIS) 10 MG tablet Take 0.5 tablets (5 mg total) by mouth daily as needed for erectile dysfunction. 11/28/15  Yes Gay Filler Copland, MD  ondansetron (ZOFRAN-ODT) 8 MG disintegrating tablet Take 1 tablet (8 mg total) by mouth every 8 (eight) hours as needed for nausea. 07/11/16   Sedalia Muta, FNP  valsartan (DIOVAN) 160 MG tablet Take 1 tablet (160 mg total) by mouth daily. 07/11/16   Sedalia Muta, FNP     Allergies  Allergen Reactions  . Ace Inhibitors Other (See Comments)    ANGIOEDEMA OF JOINTS- SEVERE       Objective:  Physical Exam  Constitutional: He is oriented to person, place, and time. He appears well-developed and well-nourished.  Cardiovascular: Normal rate, regular rhythm, normal heart sounds and intact distal  pulses.   Pulmonary/Chest: Effort normal and breath sounds normal.  Abdominal: He exhibits no distension and no mass. There is no tenderness. There is no rebound and no guarding.  Soft. Hypoactive bowel sounds.   Neurological: He is alert and oriented to person, place, and time. He has normal reflexes.  Skin: Skin is warm and dry.  Psychiatric: He has a normal mood and affect. Judgment and thought content normal.           Assessment & Plan:  Blake Roberts was seen today for nausea with vomiting.  During today's visit we addressed providing a referral for hematochromatosis and current medication management of hypertension.  Intractable vomiting with nausea, unspecified vomiting type -     POCT CBC -     Comprehensive metabolic panel Ondansetron (ZOFRAN-ODT) 8 MG disintegrating tablet;  Take 1 tablet (8 mg total) by mouth every 8 (eight) hours as needed for nausea.  Hemochromatosis -     Ambulatory referral to Oncology -     Comprehensive metabolic panel       -      Ferritin Level   Hypertension Valsartan (DIOVAN) 160 MG tablet; Take 1 tablet (160 mg total) by mouth daily. Continue amlodipine as prescribed. Return for follow-up in 1 month and bring a log of home blood pressure readings.  Carroll Sage. Kenton Kingfisher, MSN, FNP-C [facility]

## 2016-07-12 ENCOUNTER — Encounter: Payer: Self-pay | Admitting: Family Medicine

## 2016-08-01 ENCOUNTER — Ambulatory Visit (HOSPITAL_BASED_OUTPATIENT_CLINIC_OR_DEPARTMENT_OTHER): Payer: 59 | Admitting: Hematology

## 2016-08-01 ENCOUNTER — Encounter: Payer: Self-pay | Admitting: Hematology

## 2016-08-01 ENCOUNTER — Ambulatory Visit (HOSPITAL_BASED_OUTPATIENT_CLINIC_OR_DEPARTMENT_OTHER): Payer: 59

## 2016-08-01 ENCOUNTER — Telehealth: Payer: Self-pay | Admitting: Hematology

## 2016-08-01 VITALS — BP 135/88 | HR 93 | Temp 98.0°F | Resp 18 | Ht 71.0 in | Wt 227.7 lb

## 2016-08-01 DIAGNOSIS — I10 Essential (primary) hypertension: Secondary | ICD-10-CM

## 2016-08-01 DIAGNOSIS — R5383 Other fatigue: Secondary | ICD-10-CM

## 2016-08-01 DIAGNOSIS — R7989 Other specified abnormal findings of blood chemistry: Secondary | ICD-10-CM

## 2016-08-01 LAB — CBC & DIFF AND RETIC
BASO%: 0.4 % (ref 0.0–2.0)
BASOS ABS: 0 10*3/uL (ref 0.0–0.1)
EOS ABS: 0.1 10*3/uL (ref 0.0–0.5)
EOS%: 1.1 % (ref 0.0–7.0)
HCT: 41.3 % (ref 38.4–49.9)
HEMOGLOBIN: 14.4 g/dL (ref 13.0–17.1)
IMMATURE RETIC FRACT: 5.2 % (ref 3.00–10.60)
LYMPH%: 30.5 % (ref 14.0–49.0)
MCH: 31.6 pg (ref 27.2–33.4)
MCHC: 34.9 g/dL (ref 32.0–36.0)
MCV: 90.6 fL (ref 79.3–98.0)
MONO#: 0.5 10*3/uL (ref 0.1–0.9)
MONO%: 7 % (ref 0.0–14.0)
NEUT#: 4.5 10*3/uL (ref 1.5–6.5)
NEUT%: 61 % (ref 39.0–75.0)
PLATELETS: 261 10*3/uL (ref 140–400)
RBC: 4.56 10*6/uL (ref 4.20–5.82)
RDW: 12.6 % (ref 11.0–14.6)
RETIC CT ABS: 48.79 10*3/uL (ref 34.80–93.90)
Retic %: 1.07 % (ref 0.80–1.80)
WBC: 7.3 10*3/uL (ref 4.0–10.3)
lymph#: 2.2 10*3/uL (ref 0.9–3.3)

## 2016-08-01 LAB — COMPREHENSIVE METABOLIC PANEL
ALBUMIN: 4.6 g/dL (ref 3.5–5.0)
ALK PHOS: 53 U/L (ref 40–150)
ALT: 26 U/L (ref 0–55)
ANION GAP: 12 meq/L — AB (ref 3–11)
AST: 20 U/L (ref 5–34)
BILIRUBIN TOTAL: 0.61 mg/dL (ref 0.20–1.20)
BUN: 16.1 mg/dL (ref 7.0–26.0)
CO2: 21 mEq/L — ABNORMAL LOW (ref 22–29)
CREATININE: 1.1 mg/dL (ref 0.7–1.3)
Calcium: 9.8 mg/dL (ref 8.4–10.4)
Chloride: 106 mEq/L (ref 98–109)
EGFR: 77 mL/min/{1.73_m2} — AB (ref >90)
GLUCOSE: 103 mg/dL (ref 70–140)
Potassium: 4.6 mEq/L (ref 3.5–5.1)
Sodium: 139 mEq/L (ref 136–145)
TOTAL PROTEIN: 8.2 g/dL (ref 6.4–8.3)

## 2016-08-01 NOTE — Telephone Encounter (Signed)
Gave pt cal & avs °

## 2016-08-01 NOTE — Progress Notes (Signed)
Marland Kitchen    HEMATOLOGY/ONCOLOGY CONSULTATION NOTE  Date of Service: 08/01/2016  Patient Care Team: Wendie Agreste, MD as PCP - General (Family Medicine)  CHIEF COMPLAINTS/PURPOSE OF CONSULTATION:  Elevated ferritin and fatigue  HISTORY OF PRESENTING ILLNESS:   Blake Roberts is a wonderful 50 y.o. male who has been referred to [place] by Dr Wendie Agreste, MD /Dr Adrian Prows for evaluation and management of elevated ferritin.  Patient has a history of hypertension, dyslipidemia, anxiety, arthritis, depression who has been following with Dr.Ganji at [facility] for evaluation of dizziness and near-syncope as well as nausea vomiting and epigastric pain over the last 6 months. He has also apparently had significant dyspnea on exertion and was concerned since his brother had a pacemaker placed at age 47 years. He was noted to have uncontrolled hypertension and in addition to amlodipine that he was on was also started on valsartan/hydrochlorothiazide. He also had an echo and an event monitor evaluation which did not show any cardiomyopathy or arrhythmias. He subsequently had a treadmill stress test on 02/17/2016 that showed normal exercise tolerance and no evidence of overt ischemia. No arrhythmias.  During the course of his workup for fatigue and dyspnea on exertion he was noted to have an elevated ferritin level of 769 on 12/30/2015. This has remained elevated in the 500-700 range and he was referred to [place] for evaluation and management of possible hemochromatosis.  Patient notes family history of unexplained heart disease in his brother at age 11. No family history of liver cirrhosis no known family history of iron overload or hemochromatosis.  He notes the fatigue has been more bothersome since early part of this year and he is had some joint pains as well. Denies excessive alcohol use. Denies taking iron supplementation over-the-counter or using cast iron skin metastases for  cooking.  MEDICAL HISTORY:  Past Medical History:  Diagnosis Date  . Acute meniscal tear of knee RIGHT KNEE  . Anxiety   . Arthritis RIGHT KNEE  . Bulging lumbar disc L5 - S1  . Depression   . Hemochromatosis   . Hyperlipidemia   . Hypertension     SURGICAL HISTORY: Past Surgical History:  Procedure Laterality Date  . KNEE ARTHROSCOPY  AGE 36 (APPROX)   RIGHT KNEE  . KNEE ARTHROSCOPY  07/13/2012   Procedure: ARTHROSCOPY KNEE;  Surgeon: Magnus Sinning, MD;  Location: [place];  Service: Orthopedics;  Laterality: Right;  WITH PARTIAL MEDIAL MENISECTOMY AND REMOVAL OF LOOSE BODIES  . torn rotator cuff      SOCIAL HISTORY: Social History   Social History  . Marital status: Single    Spouse name: N/A  . Number of children: N/A  . Years of education: N/A   Occupational History  . Not on file.   Social History Main Topics  . Smoking status: Never Smoker  . Smokeless tobacco: Never Used  . Alcohol use 1.2 - 2.4 oz/week    2 - 4 Cans of beer per week     Comment: quit drinking a beer x 1 month  . Drug use: No  . Sexual activity: Yes   Other Topics Concern  . Not on file   Social History Narrative  . No narrative on file    FAMILY HISTORY: Family History  Problem Relation Age of Onset  . Cancer Mother   . Diabetes Father   . Heart disease Father   . Hyperlipidemia Father   . Hypertension Father   .  Kidney disease Father   . Colon cancer Neg Hx     Maternal grandmother had breast cancer at age 87 years Mother had lung cancer she was a smoker Maternal grandfather lung cancer Maternal aunt throat cancer Reports that he does not know much regarding his father's side of the family.   ALLERGIES:  is allergic to ace inhibitors.  MEDICATIONS:  Current Outpatient Prescriptions  Medication Sig Dispense Refill  . amLODipine (NORVASC) 10 MG tablet Take 10 mg by mouth daily.    Marland Kitchen buPROPion (WELLBUTRIN XL) 300 MG 24 hr tablet Take 300 mg by  mouth daily.    . Multiple Vitamins-Minerals (MULTIVITAMIN & MINERAL PO) Take by mouth. Reported on 01/18/2016    . ondansetron (ZOFRAN) 8 MG tablet Take 1 tablet (8 mg total) by mouth every 8 (eight) hours as needed for nausea or vomiting. 60 tablet 1  . rosuvastatin (CRESTOR) 20 MG tablet Take 1 tablet (20 mg total) by mouth daily. 30 tablet 6  . tadalafil (CIALIS) 10 MG tablet Take 0.5 tablets (5 mg total) by mouth daily as needed for erectile dysfunction. 10 tablet 9  . valsartan (DIOVAN) 160 MG tablet Take 1 tablet (160 mg total) by mouth daily. 30 tablet 1   No current facility-administered medications for this visit.     REVIEW OF SYSTEMS:    10 Point review of Systems was done is negative except as noted above.  PHYSICAL EXAMINATION: ECOG PERFORMANCE STATUS: 1 - Symptomatic but completely ambulatory  . Vitals:   08/01/16 1416  BP: 135/88  Pulse: 93  Resp: 18  Temp: 98 F (36.7 C)   Filed Weights   08/01/16 1416  Weight: 227 lb 11.2 oz (103.3 kg)   .Body mass index is 31.76 kg/m.  GENERAL:alert, in no acute distress and comfortable SKIN: skin color, texture, turgor are normal, no rashes or significant lesions EYES: normal, conjunctiva are pink and non-injected, sclera clear OROPHARYNX:no exudate, no erythema and lips, buccal mucosa, and tongue normal  NECK: supple, no JVD, thyroid normal size, non-tender, without nodularity LYMPH:  no palpable lymphadenopathy in the cervical, axillary or inguinal LUNGS: clear to auscultation with normal respiratory effort HEART: regular rate & rhythm,  no murmurs and no lower extremity edema ABDOMEN: abdomen soft, non-tender, normoactive bowel sounds  Musculoskeletal: no cyanosis of digits and no clubbing  PSYCH: alert & oriented x 3 with fluent speech NEURO: no focal motor/sensory deficits  LABORATORY DATA:  I have reviewed the data as listed  . CBC Latest Ref Rng & Units 08/01/2016 07/11/2016 03/30/2016  WBC 4.0 - 10.3 10e3/uL  7.3 7.9 9.9  Hemoglobin 13.0 - 17.1 g/dL 14.4 14.8 14.4  Hematocrit 38.4 - 49.9 % 41.3 42.0(A) 40.4(A)  Platelets 140 - 400 10e3/uL 261 - -    . CMP Latest Ref Rng & Units 09/03/2016 08/01/2016 07/11/2016  Glucose 65 - 99 mg/dL 104(H) 103 121(H)  BUN 7 - 25 mg/dL 15 16.1 14  Creatinine 0.70 - 1.33 mg/dL 1.16 1.1 1.25  Sodium 135 - 146 mmol/L 136 139 139  Potassium 3.5 - 5.3 mmol/L 5.0 4.6 4.4  Chloride 98 - 110 mmol/L 103 - 102  CO2 20 - 31 mmol/L 21 21(L) 24  Calcium 8.6 - 10.3 mg/dL 9.6 9.8 9.5  Total Protein 6.1 - 8.1 g/dL 8.0 8.2 7.7  Total Bilirubin 0.2 - 1.2 mg/dL 0.8 0.61 0.8  Alkaline Phos 40 - 115 U/L 48 53 53  AST 10 - 35 U/L 16 20 21  ALT 9 - 46 U/L 17 26 22    Component     Latest Ref Rng & Units 08/01/2016  Iron     42 - 163 ug/dL 116  TIBC     202 - 409 ug/dL 343  UIBC     117 - 376 ug/dL 226  %SAT     20 - 55 % 34  Ferritin     22 - 316 ng/ml 628 (H)  Transferrin     200 - 370 mg/dL 280    RADIOGRAPHIC STUDIES: I have personally reviewed the radiological images as listed and agreed with the findings in the report. No results found.  ASSESSMENT & PLAN:   50 year old Caucasian male with  1) Elevated Ferritin levels. Patient reports some unexplained fatigue and joint pains. His brother needed a pacemaker at age 66yr no clear etiology. He has had some episodes of dizziness that have been worked up by his cardiologist Dr. Einar Gip and and he has had a negative cardiac workup thus far . No known history of liver disease or elevated liver function tests at this time . No family history of liver cirrhosis , cardiac death or hemochromatosis . Plan -Patient's ferritin levels have been persistently elevated since at least January 2017 in the absence of any overt iron ingestion .patient reports no issues with alcohol excess . -Transaminases are within normal limits and do not suggest liver inflammation with release of ferritin from iron stores . -We shall send out a  hemochromatosis HFE genetic testing panel . -His ferritin levels currently are in the 600s and iron saturation is only 34% . This would overall not increase his risk of acute liver cardiac injury significantly . -He was recommended to avoid alcohol ingestion . -He was recommended to avoid any oral ingestion of iron pills or other supplements containing large amounts of iron .  Return to care with Dr. Irene Limbo in 3 weeks with results of HFE gene mutation.  All of the patients questions were answered with apparent satisfaction. The patient knows to call the clinic with any problems, questions or concerns.  I spent 40 minutes counseling the patient face to face. The total time spent in the appointment was 50 minutes and more than 50% was on counseling and direct patient cares.    Sullivan Lone MD Basile AAHIVMS [facility] Hematology/Oncology Physician [facility]  (Office):       [phone] (Work cell):  [phone] (Fax):           [phone]  08/01/2016 2:55 PM

## 2016-08-02 LAB — IRON AND TIBC
%SAT: 34 % (ref 20–55)
Iron: 116 ug/dL (ref 42–163)
TIBC: 343 ug/dL (ref 202–409)
UIBC: 226 ug/dL (ref 117–376)

## 2016-08-02 LAB — FERRITIN: FERRITIN: 628 ng/mL — AB (ref 22–316)

## 2016-08-02 LAB — TRANSFERRIN: Transferrin: 280 mg/dL (ref 200–370)

## 2016-08-04 LAB — HEMOCHROMATOSIS DNA-PCR(C282Y,H63D)

## 2016-08-08 MED FILL — BUPROPION HCL XL 300 MG TAB: 300 | 30 days supply | Qty: 30 | Fill #0

## 2016-08-11 MED FILL — AMLODIPINE BESYLATE 10 MG T: 10 | 90 days supply | Qty: 90 | Fill #0

## 2016-08-13 IMAGING — RF DG ESOPHAGUS
10 of 17 series · 14 of 24 positions shown · non-contrast
Comparison: None.

CLINICAL DATA: New diagnosis of hemochromatosis with dizziness and
nausea. Metallic taste in mouth.

EXAM:
ESOPHOGRAM / BARIUM SWALLOW / BARIUM TABLET STUDY
TECHNIQUE: Combined double contrast and single contrast examination performed
using effervescent crystals, thick barium liquid, and thin barium
liquid. The patient was observed with fluoroscopy swallowing a 13 mm
barium sulphate tablet.
FLUOROSCOPY TIME:  Radiation Exposure Index (as provided by the
fluoroscopic device): 14.8 mGy
If the device does not provide the exposure index:
Fluoroscopy Time:  0 minutes 35 seconds.
Number of Acquired Images:  11

[Series 1: run · 3 of 6 slices shown (1 of 10)]
[im 1/6]
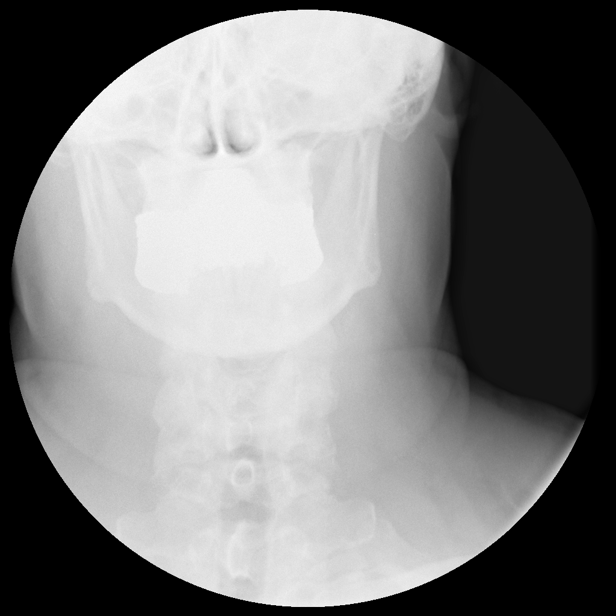
[im 3/6]
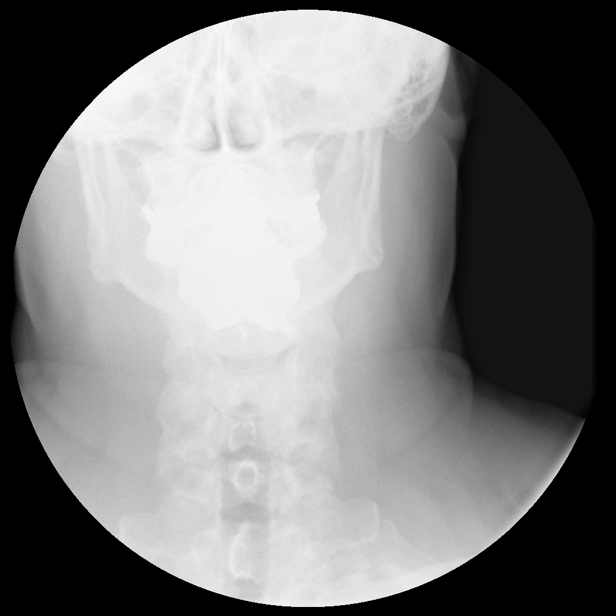
[im 6/6]
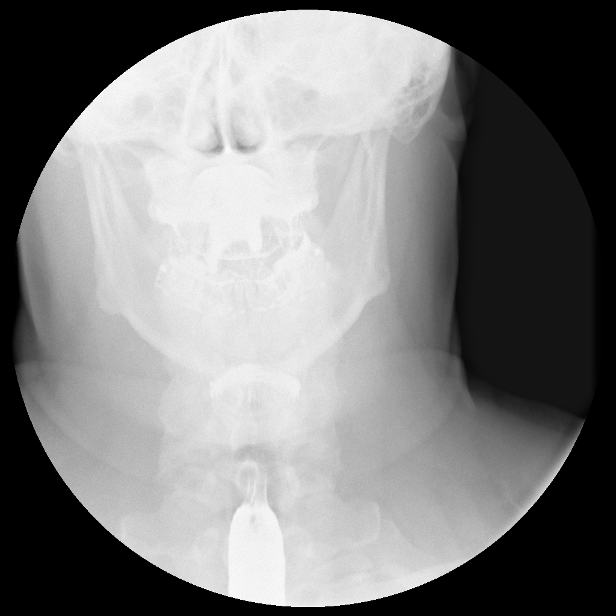

[Series 3: run · 3 of 5 slices shown (2 of 10)]
[im 1/5]
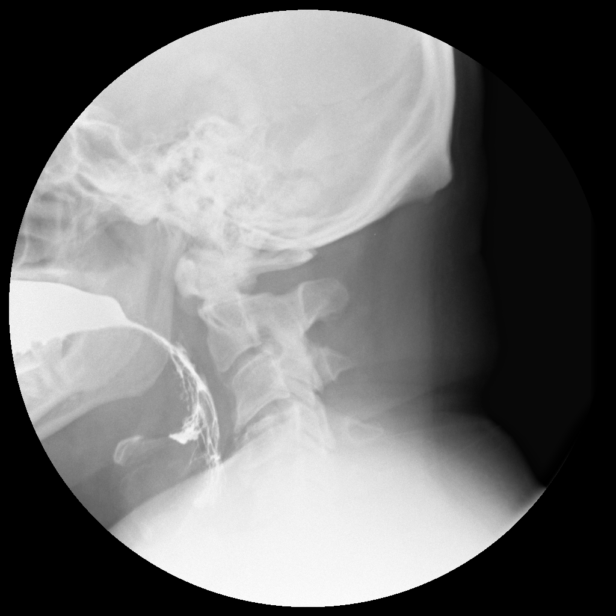
[im 2/5]
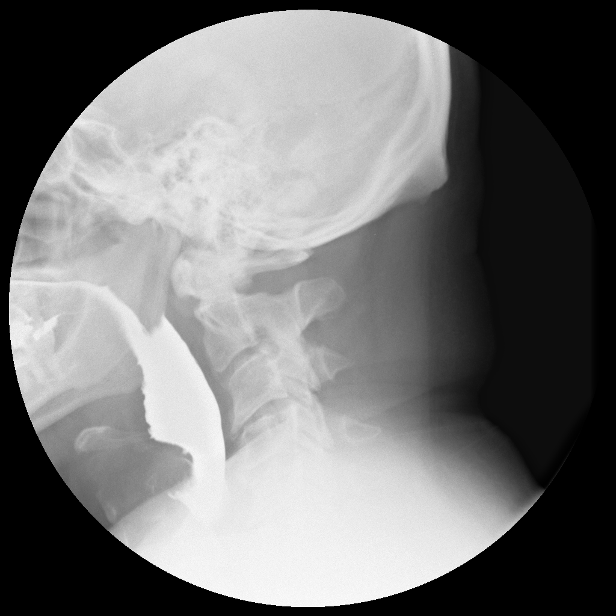
[im 5/5]
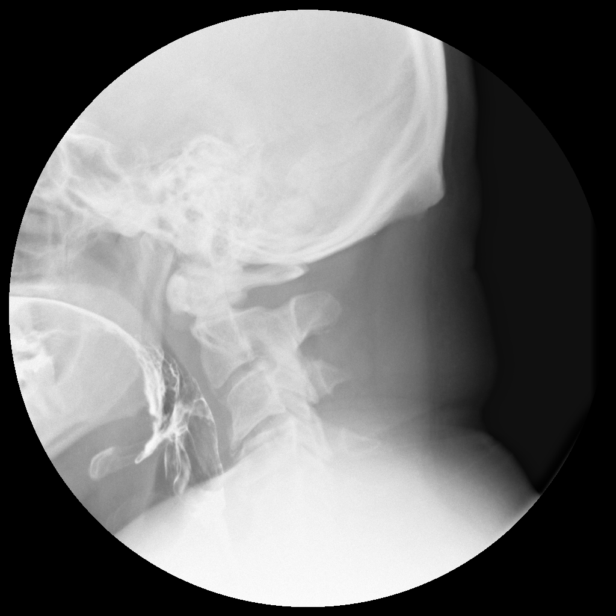

[Series 5: run · 1 of 1 slices shown (3 of 10)]
[im 1/1]
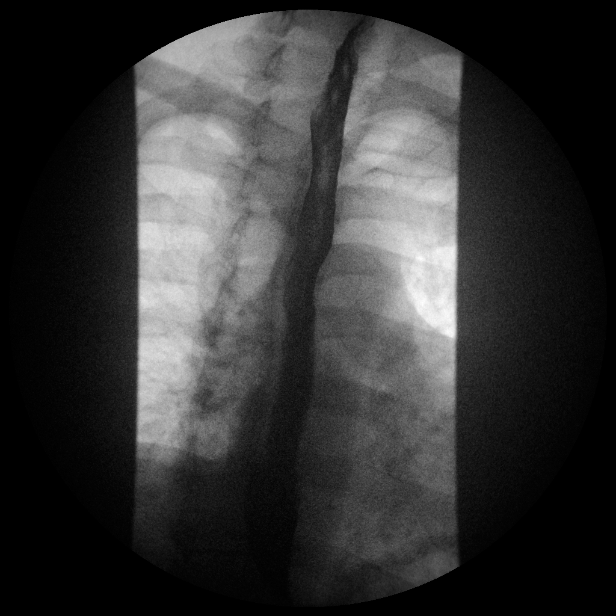

[Series 6: run · 1 of 1 slices shown (4 of 10)]
[im 1/1]
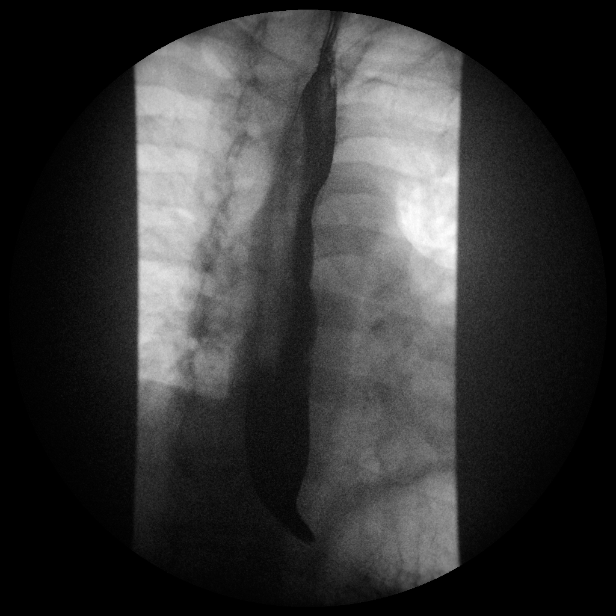

[Series 8: run · 1 of 1 slices shown (5 of 10)]
[im 1/1]
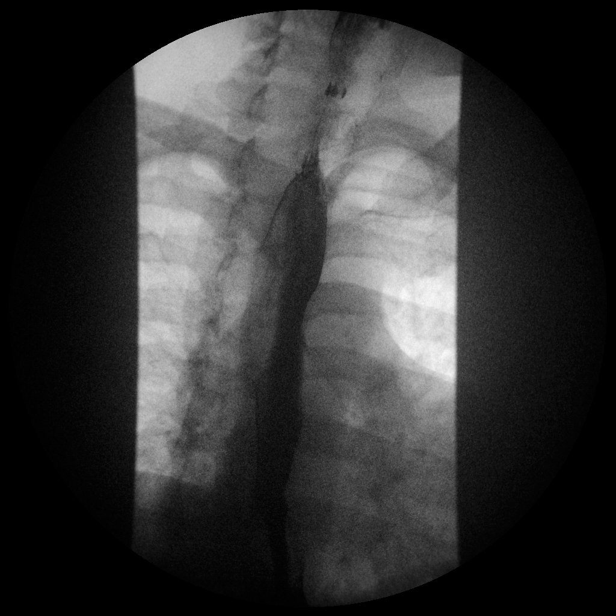

[Series 10: run · 1 of 1 slices shown (6 of 10)]
[im 1/1]
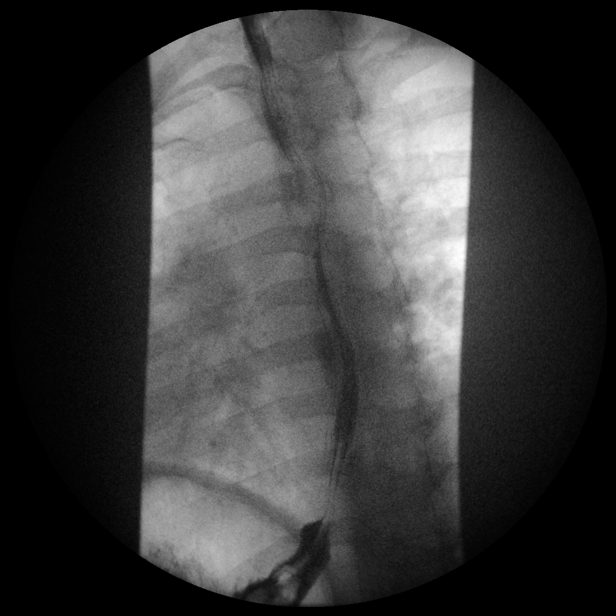

[Series 12: run · 1 of 1 slices shown (7 of 10)]
[im 1/1]
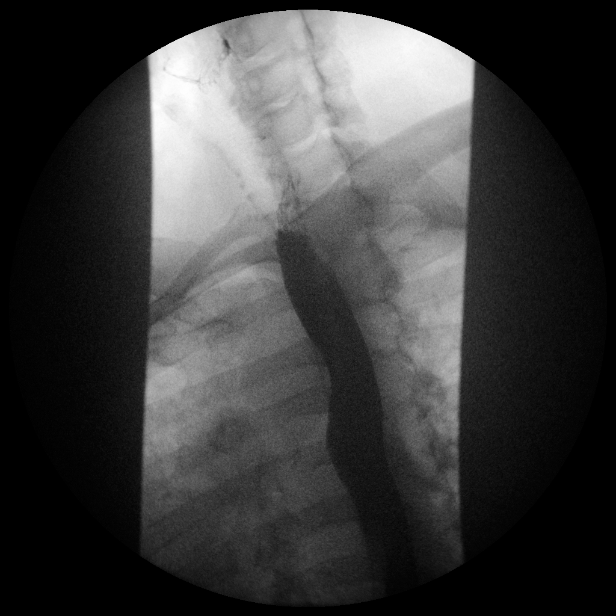

[Series 13: run · 1 of 1 slices shown (8 of 10)]
[im 1/1]
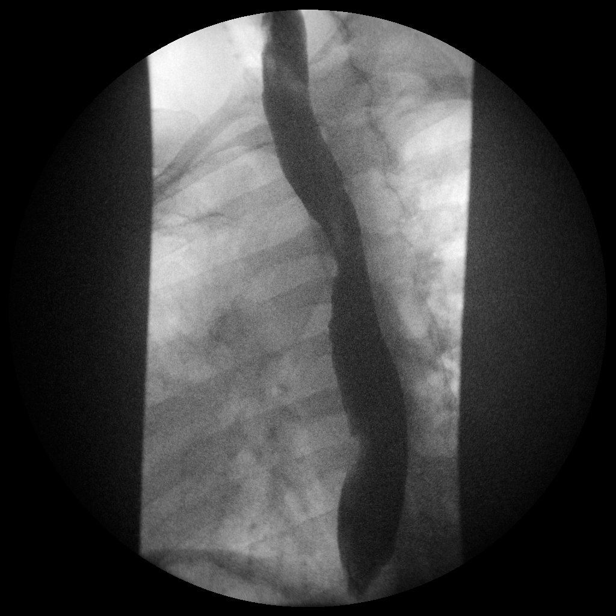

[Series 15: run · 1 of 1 slices shown (9 of 10)]
[im 1/1]
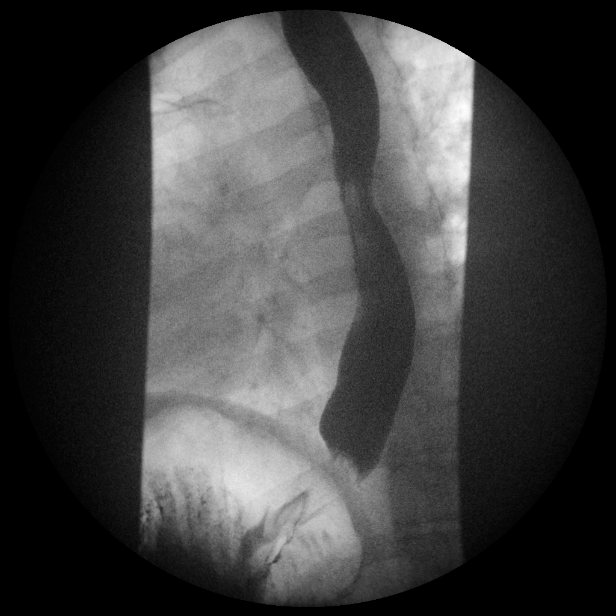

[Series 17: run · 1 of 1 slices shown (10 of 10)]
[im 1/1]
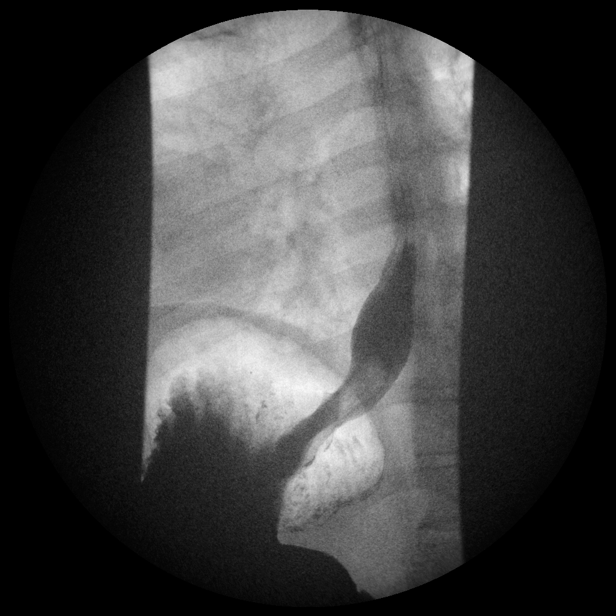

[14 of 24 positions shown; findings below may reference images not displayed]

FINDINGS: Swallowing mechanism is unremarkable. Esophageal motility is normal.
No esophageal fold thickening, stricture or obstruction. A 13 mm
barium tablet passed into the stomach without difficulty.
IMPRESSION: Normal exam.

## 2016-08-15 MED FILL — VALSARTAN 160 MG TABLET: 160 | 30 days supply | Qty: 30 | Fill #0

## 2016-08-22 ENCOUNTER — Encounter: Payer: Self-pay | Admitting: Hematology

## 2016-08-22 ENCOUNTER — Ambulatory Visit (HOSPITAL_BASED_OUTPATIENT_CLINIC_OR_DEPARTMENT_OTHER): Payer: 59 | Admitting: Hematology

## 2016-08-22 DIAGNOSIS — R5383 Other fatigue: Secondary | ICD-10-CM | POA: Diagnosis not present

## 2016-08-22 DIAGNOSIS — I10 Essential (primary) hypertension: Secondary | ICD-10-CM | POA: Diagnosis not present

## 2016-08-22 DIAGNOSIS — R7989 Other specified abnormal findings of blood chemistry: Secondary | ICD-10-CM | POA: Diagnosis not present

## 2016-09-03 ENCOUNTER — Ambulatory Visit (INDEPENDENT_AMBULATORY_CARE_PROVIDER_SITE_OTHER): Payer: 59 | Admitting: Family Medicine

## 2016-09-03 VITALS — BP 124/80 | HR 85 | Temp 98.0°F | Resp 16 | Ht 71.5 in | Wt 226.0 lb

## 2016-09-03 DIAGNOSIS — E785 Hyperlipidemia, unspecified: Secondary | ICD-10-CM | POA: Diagnosis not present

## 2016-09-03 DIAGNOSIS — R41840 Attention and concentration deficit: Secondary | ICD-10-CM | POA: Diagnosis not present

## 2016-09-03 LAB — THYROID PANEL WITH TSH
Free Thyroxine Index: 2 (ref 1.4–3.8)
T3 Uptake: 34 % (ref 22–35)
T4, Total: 5.8 ug/dL (ref 4.5–12.0)
TSH: 1.91 mIU/L (ref 0.40–4.50)

## 2016-09-03 LAB — COMPREHENSIVE METABOLIC PANEL
ALT: 17 U/L (ref 9–46)
AST: 16 U/L (ref 10–35)
Albumin: 4.8 g/dL (ref 3.6–5.1)
Alkaline Phosphatase: 48 U/L (ref 40–115)
BUN: 15 mg/dL (ref 7–25)
CO2: 21 mmol/L (ref 20–31)
Calcium: 9.6 mg/dL (ref 8.6–10.3)
Chloride: 103 mmol/L (ref 98–110)
Creat: 1.16 mg/dL (ref 0.70–1.33)
Glucose, Bld: 104 mg/dL — ABNORMAL HIGH (ref 65–99)
Potassium: 5 mmol/L (ref 3.5–5.3)
Sodium: 136 mmol/L (ref 135–146)
Total Bilirubin: 0.8 mg/dL (ref 0.2–1.2)
Total Protein: 8 g/dL (ref 6.1–8.1)

## 2016-09-03 LAB — VITAMIN B12: Vitamin B-12: 539 pg/mL (ref 200–1100)

## 2016-09-03 MED ORDER — ROSUVASTATIN CALCIUM 20 MG PO TABS: 20.0000 mg | ORAL_TABLET | Freq: Every day | ORAL | 6 refills | Status: DC

## 2016-09-03 NOTE — Patient Instructions (Addendum)
We are proceeding with lab tests to see if there is an underlying chemical disturbance that is responsible for the disturbed concentration. I'm also setting up a neurology appointment so that if the labs are all normal, the neurologist can do further evaluation.  As we discussed, times in life that are very stressful can interfere with one's ability to remember or concentrate.    IF you received an x-ray today, you will receive an invoice from [facility]. Please contact [facility] at [phone] with questions or concerns regarding your invoice.   IF you received labwork today, you will receive an invoice from [employer]. Please contact [facility] at [phone] with questions or concerns regarding your invoice.   Our billing staff will not be able to assist you with questions regarding bills from these companies.  You will be contacted with the lab results as soon as they are available. The fastest way to get your results is to activate your My Chart account. Instructions are located on the last page of this paperwork. If you have not heard from [place] regarding the results in 2 weeks, please contact this office.

## 2016-09-03 NOTE — Progress Notes (Signed)
This is a 50 year old gentleman who comes in with problems concentrating for 3 months.  This is a new problem.  Patient is being treated for hemochromatosis. He's scheduled to start phlebotomizing next week.  Patient works as a Warehouse manager. He just started a new job and is supposed to start working nights next week.  Patient recently separated from his wife and got divorced.  Patient describes difficulty remembering things such as why he went into her room, a friend's name.  He's sleeping well and only takes antihypertensive medicine. He's had no double vision, change in hearing, trouble exercise or movement. He is concerned that his activities and youth playing football and cover contact sports may have caused a postconcussion type syndrome.  Patient also asks that I refill his cholesterol medication. Setting no muscle aches. He's had no prominent this liver related to the cholesterol medication. He says that his cholesterol generally is slightly elevated with this but it's within target range.  Objective:BP 124/80 (BP Location: Right Arm, Patient Position: Sitting, Cuff Size: Normal)   Pulse 85   Temp 98 F (36.7 C) (Oral)   Resp 16   Ht 5' 11.5" (1.816 m)   Wt 226 lb (102.5 kg)   SpO2 98%   BMI 31.08 kg/m  HEENT: Unremarkable with normal fundi, normal TMs, normal oropharynx Neck: Supple no adenopathy or thyromegaly Chest: Clear Heart: Regular no murmur  Assessment: Patient is going through a stressful period of time. This can have effects on one's ability to concentrate. I explained this to him and we've decided that we would do some screening for some underlying problems that may contribute to [facility] loss or difficulty concentrating. I will also be setting up a neurology appointment in case these lab tests are negative.  Plan: Disturbed concentration - Plan: Vitamin B12, Thyroid Panel With TSH, Comprehensive metabolic panel, Ambulatory referral to  Neurology  Hyperlipidemia - Plan: rosuvastatin (CRESTOR) 20 MG tablet  Robyn Haber, MD

## 2016-09-05 ENCOUNTER — Other Ambulatory Visit: Payer: 59

## 2016-09-05 MED FILL — ROSUVASTATIN CALCIUM 20 MG: 20 | 30 days supply | Qty: 30 | Fill #0

## 2016-09-06 ENCOUNTER — Telehealth: Payer: Self-pay | Admitting: Hematology

## 2016-09-06 NOTE — Telephone Encounter (Signed)
called patient to confirm appointment per 08/22/16 los. L/M on V/M. Appointment ltr and schedule mailed. 09/06/16

## 2016-09-12 MED FILL — BUPROPION HCL XL 300 MG TAB: 300 | 30 days supply | Qty: 30 | Fill #1

## 2016-09-19 ENCOUNTER — Other Ambulatory Visit: Payer: 59

## 2016-09-19 ENCOUNTER — Ambulatory Visit: Payer: 59 | Admitting: Hematology

## 2016-09-25 NOTE — Progress Notes (Signed)
Marland Kitchen    HEMATOLOGY/ONCOLOGY CLINIC NOTE  Date of Service: .08/22/2016  Patient Care Team: Wendie Agreste, MD as PCP - General (Family Medicine)  CHIEF COMPLAINTS/PURPOSE OF CONSULTATION:  Elevated ferritin and fatigue  HISTORY OF PRESENTING ILLNESS:   Blake Roberts is a wonderful 50 y.o. male who has been referred to [place] by Dr Wendie Agreste, MD /Dr Adrian Prows for evaluation and management of elevated ferritin.  Patient has a history of hypertension, dyslipidemia, anxiety, arthritis, depression who has been following with Dr.Ganji at [facility] for evaluation of dizziness and near-syncope as well as nausea vomiting and epigastric pain over the last 6 months. He has also apparently had significant dyspnea on exertion and was concerned since his brother had a pacemaker placed at age 47 years. He was noted to have uncontrolled hypertension and in addition to amlodipine that he was on was also started on valsartan/hydrochlorothiazide. He also had an echo and an event monitor evaluation which did not show any cardiomyopathy or arrhythmias. He subsequently had a treadmill stress test on 02/17/2016 that showed normal exercise tolerance and no evidence of overt ischemia. No arrhythmias.  During the course of his workup for fatigue and dyspnea on exertion he was noted to have an elevated ferritin level of 769 on 12/30/2015. This has remained elevated in the 500-700 range and he was referred to [place] for evaluation and management of possible hemochromatosis.  Patient notes family history of unexplained heart disease in his brother at age 85. No family history of liver cirrhosis no known family history of iron overload or hemochromatosis.  He notes the fatigue has been more bothersome since early part of this year and he is had some joint pains as well. Denies excessive alcohol use. Denies taking iron supplementation over-the-counter or using cast iron skin metastases for  cooking.  INTERVAL HISTORY  Blake Roberts is here for follow-up to discuss the results of his lab tests and to determine management of his elevated ferritin levels. We discussed that he did not have one of the common HFE gene mutations that are known to cause hemochromatosis. It is possible he might have one of the rare and other mutations that are not picked up on this that could cause hemochromatosis syndrome. We discussed that at this point since he still has symptoms of unexplained fatigue that one could do a trial of therapeutic phlebotomies to determine how resistant his ferritin levels are to therapeutic phlebotomies since that would be a diagnostic and a therapeutic challenge. He was given an option of just watching this with his primary care physician and possibly having an MRI of his heart and liver with iron quantification which could be done at [facility].  MEDICAL HISTORY:  Past Medical History:  Diagnosis Date  . Acute meniscal tear of knee RIGHT KNEE  . Anxiety   . Arthritis RIGHT KNEE  . Bulging lumbar disc L5 - S1  . Depression   . Hemochromatosis   . Hyperlipidemia   . Hypertension     SURGICAL HISTORY: Past Surgical History:  Procedure Laterality Date  . KNEE ARTHROSCOPY  AGE 75 (APPROX)   RIGHT KNEE  . KNEE ARTHROSCOPY  07/13/2012   Procedure: ARTHROSCOPY KNEE;  Surgeon: Magnus Sinning, MD;  Location: [place];  Service: Orthopedics;  Laterality: Right;  WITH PARTIAL MEDIAL MENISECTOMY AND REMOVAL OF LOOSE BODIES  . torn rotator cuff      SOCIAL HISTORY: Social History   Social History  .  Marital status: Single    Spouse name: N/A  . Number of children: N/A  . Years of education: N/A   Occupational History  . Not on file.   Social History Main Topics  . Smoking status: Never Smoker  . Smokeless tobacco: Never Used  . Alcohol use 1.2 - 2.4 oz/week    2 - 4 Cans of beer per week     Comment: quit drinking a beer x 1 month  . Drug use: No  .  Sexual activity: Yes   Other Topics Concern  . Not on file   Social History Narrative  . No narrative on file    FAMILY HISTORY: Family History  Problem Relation Age of Onset  . Cancer Mother   . Diabetes Father   . Heart disease Father   . Hyperlipidemia Father   . Hypertension Father   . Kidney disease Father   . Colon cancer Neg Hx     Maternal grandmother had breast cancer at age 34 years Mother had lung cancer she was a smoker Maternal grandfather lung cancer Maternal aunt throat cancer Reports that he does not know much regarding his father's side of the family.   ALLERGIES:  is allergic to ace inhibitors.  MEDICATIONS:  Current Outpatient Prescriptions  Medication Sig Dispense Refill  . amLODipine (NORVASC) 10 MG tablet Take 10 mg by mouth daily.    Marland Kitchen buPROPion (WELLBUTRIN XL) 300 MG 24 hr tablet Take 300 mg by mouth daily.    . Multiple Vitamins-Minerals (MULTIVITAMIN & MINERAL PO) Take by mouth. Reported on 01/18/2016    . ondansetron (ZOFRAN) 8 MG tablet Take 1 tablet (8 mg total) by mouth every 8 (eight) hours as needed for nausea or vomiting. 60 tablet 1  . rosuvastatin (CRESTOR) 20 MG tablet Take 1 tablet (20 mg total) by mouth daily. 30 tablet 6  . tadalafil (CIALIS) 10 MG tablet Take 0.5 tablets (5 mg total) by mouth daily as needed for erectile dysfunction. 10 tablet 9  . valsartan (DIOVAN) 160 MG tablet Take 1 tablet (160 mg total) by mouth daily. 30 tablet 1   No current facility-administered medications for this visit.     REVIEW OF SYSTEMS:    10 Point review of Systems was done is negative except as noted above.  PHYSICAL EXAMINATION: ECOG PERFORMANCE STATUS: 1 - Symptomatic but completely ambulatory  . Vitals:   08/22/16 1406  BP: 138/85  Pulse: 94  Resp: 18  Temp: 99 F (37.2 C)   Filed Weights   08/22/16 1406  Weight: 226 lb 8 oz (102.7 kg)   .Body mass index is 31.59 kg/m.  GENERAL:alert, in no acute distress and  comfortable SKIN: skin color, texture, turgor are normal, no rashes or significant lesions EYES: normal, conjunctiva are pink and non-injected, sclera clear OROPHARYNX:no exudate, no erythema and lips, buccal mucosa, and tongue normal  NECK: supple, no JVD, thyroid normal size, non-tender, without nodularity LYMPH:  no palpable lymphadenopathy in the cervical, axillary or inguinal LUNGS: clear to auscultation with normal respiratory effort HEART: regular rate & rhythm,  no murmurs and no lower extremity edema ABDOMEN: abdomen soft, non-tender, normoactive bowel sounds  Musculoskeletal: no cyanosis of digits and no clubbing  PSYCH: alert & oriented x 3 with fluent speech NEURO: no focal motor/sensory deficits  LABORATORY DATA:  I have reviewed the data as listed  . CBC Latest Ref Rng & Units 08/01/2016 07/11/2016 03/30/2016  WBC 4.0 - 10.3 10e3/uL 7.3 7.9 9.9  Hemoglobin 13.0 - 17.1 g/dL 14.4 14.8 14.4  Hematocrit 38.4 - 49.9 % 41.3 42.0(A) 40.4(A)  Platelets 140 - 400 10e3/uL 261 - -    . CMP Latest Ref Rng & Units 09/03/2016 08/01/2016 07/11/2016  Glucose 65 - 99 mg/dL 104(H) 103 121(H)  BUN 7 - 25 mg/dL 15 16.1 14  Creatinine 0.70 - 1.33 mg/dL 1.16 1.1 1.25  Sodium 135 - 146 mmol/L 136 139 139  Potassium 3.5 - 5.3 mmol/L 5.0 4.6 4.4  Chloride 98 - 110 mmol/L 103 - 102  CO2 20 - 31 mmol/L 21 21(L) 24  Calcium 8.6 - 10.3 mg/dL 9.6 9.8 9.5  Total Protein 6.1 - 8.1 g/dL 8.0 8.2 7.7  Total Bilirubin 0.2 - 1.2 mg/dL 0.8 0.61 0.8  Alkaline Phos 40 - 115 U/L 48 53 53  AST 10 - 35 U/L 16 20 21   ALT 9 - 46 U/L 17 26 22    Component     Latest Ref Rng & Units 08/01/2016  Iron     42 - 163 ug/dL 116  TIBC     202 - 409 ug/dL 343  UIBC     117 - 376 ug/dL 226  %SAT     20 - 55 % 34  Ferritin     22 - 316 ng/ml 628 (H)  Transferrin     200 - 370 mg/dL 280   Hemochromatosis DNA, PCR  Order: [id number]  Status:  Final result Visible to patient:  No (Not Released) Next appt:  09/26/2016  at 09:30 AM in Neurology Lenor Coffin, MD) Dx:  Elevated ferritin   89mo ago  Hemochromatosis Gene Comment   Comments: NO MUTATION IDENTIFIED  Interpretation:  This patient's sample was analyzed for the hereditary  hemochromatosis (HH) mutations C282Y, H63D, S65C. No  mutation was identified. The mutations analyzed by [facility]  are most common in the Caucasian population, and up to 90%  of affected Caucasians will have a positive test result.  Because this panel does not identify rare HH mutations or  HH mutations found in other ethnic groups, there are a  small number of people who may have a negative test but may  actually be affected. The diagnosis of HH should include  clinical findings and other test results such as  transferrin-iron saturation and/or serum ferritin studies  and/or liver biopsy. If this patient has a history of HH,  in many cases a specific carrier risk can be determined  based on this negative result.  Methodology:  DNA Analysis of the HFE gene was performed by PCR  amplification followed by restriction enzyme digestion  analyses.         RADIOGRAPHIC STUDIES: I have personally reviewed the radiological images as listed and agreed with the findings in the report. No results found.  ASSESSMENT & PLAN:   50 year old Caucasian male with  1) Elevated Ferritin levels. Patient reports some unexplained fatigue and joint pains. His brother needed a pacemaker at age 17yr no clear etiology. He has had some episodes of dizziness that have been worked up by his cardiologist Dr. Einar Gip and and he has had a negative cardiac workup thus far . No known history of liver disease or elevated liver function tests at this time . No family history of liver cirrhosis , overt h/o sudden cardiac death or hemochromatosis . HFE genetic panel negative. Plan -We discussed that he did not have one of the common HFE gene mutations that are known to cause hemochromatosis. -  It  is possible he might have one of the rare and other mutations that are not picked up on this that could cause hemochromatosis syndrome. - We discussed that at this point since he still has symptoms of unexplained fatigue that one could do a trial of therapeutic phlebotomies to determine how resistant his ferritin levels are to therapeutic phlebotomies since that would be a diagnostic and a therapeutic challenge.  -He was alternatively given an option of just watching this with his primary care physician and possibly having an MRI of his heart and liver with iron quantification which could be done at [facility]. -Typically the risk of end organ injury tends to be lower with ferritin levels less than 1000. -Patient chose to proceed with therapeutic phlebotomies and was setup for therapeutic phlebotomy every 2 weeks with a follow-up with me in 4 weeks after 2 phlebotomies to determine tolerance and changes in ferritin level and effects on his symptoms of fatigue.  Return to care with Dr. Irene Limbo in 4 weeks with cbc, cmp , ferritin, iron profile  All of the patients questions were answered with apparent satisfaction. The patient knows to call the clinic with any problems, questions or concerns.  I spent 20 minutes counseling the patient face to face. The total time spent in the appointment was 25 minutes and more than 50% was on counseling and direct patient cares.    Sullivan Lone MD [place] AAHIVMS [name] Hematology/Oncology Physician [facility]  (Office):       [phone] (Work cell):  [phone] (Fax):           [phone]

## 2016-09-26 ENCOUNTER — Encounter: Payer: Self-pay | Admitting: *Deleted

## 2016-09-26 ENCOUNTER — Ambulatory Visit (INDEPENDENT_AMBULATORY_CARE_PROVIDER_SITE_OTHER): Payer: 59 | Admitting: Neurology

## 2016-09-26 ENCOUNTER — Encounter: Payer: Self-pay | Admitting: Neurology

## 2016-09-26 VITALS — BP 160/97 | HR 93 | Ht 71.0 in | Wt 229.0 lb

## 2016-09-26 DIAGNOSIS — R413 Other amnesia: Secondary | ICD-10-CM | POA: Insufficient documentation

## 2016-09-26 NOTE — Progress Notes (Signed)
RN called patient per MD [facility] staff message. Patient recent change of work scheduled caused him to miss phlebotomy/MD appointments. Patient states that she would like to receive phlebotomies. Staff message sent to schedulers for reschedule.

## 2016-09-26 NOTE — Progress Notes (Signed)
Reason for visit: Memory disturbance  Referring physician: Dr. Claris Gladden is a 50 y.o. male  History of present illness:  Blake Roberts is a 50 year old right-handed white male with a history of difficulty with memory and concentration dating back about 2 years. The patient has had a 4 year history of problems with depression and being moody. He has had difficulty maintaining gainful employment, he has gone through 3 jobs and had a separation from his wife in the last 2 years. The patient has reported some difficulty with short-term memory, he has difficulty remembering names for people, recent events. He may misplace things about the house, and go into rooms and cannot remember why he went in to the room. The patient has difficulty focusing while he is trying to read. He has chronic insomnia issues, currently he works 12 hour shifts at nighttime. The patient is not sure whether he snores at night. He reports fatigue, no excessive daytime drowsiness, he is able to stay awake during the day. He denies any numbness or weakness of the extremities, he denies any balance issues or difficulty controlling the bowels or the bladder. He does report frequent headaches that are described as a bandlike sensation around the head that began about 3-4 months ago. He is being worked up and evaluated for possible hemochromatosis. Recent blood work showed good hepatic function. The patient is followed through psychiatry for his depression, he has not required hospitalization for depression in the past. He is on Crestor, he believes he started this medication 1-1/2 years ago, he is not sure that this correlates with the onset of the memory problems.  Past Medical History:  Diagnosis Date  . Acute meniscal tear of knee RIGHT KNEE  . Anxiety   . Arthritis RIGHT KNEE  . Bulging lumbar disc L5 - S1  . Depression   . Hemochromatosis   . Hyperlipidemia   . Hypertension     Past Surgical History:    Procedure Laterality Date  . KNEE ARTHROSCOPY  AGE 19 (APPROX)   RIGHT KNEE  . KNEE ARTHROSCOPY  07/13/2012   Procedure: ARTHROSCOPY KNEE;  Surgeon: Magnus Sinning, MD;  Location: [place];  Service: Orthopedics;  Laterality: Right;  WITH PARTIAL MEDIAL MENISECTOMY AND REMOVAL OF LOOSE BODIES  . torn rotator cuff      Family History  Problem Relation Age of Onset  . Cancer Mother   . Diabetes Father   . Heart disease Father   . Hyperlipidemia Father   . Hypertension Father   . Kidney disease Father   . Colon cancer Neg Hx     Social history:  reports that he has never smoked. He has never used smokeless tobacco. He reports that he drinks alcohol. He reports that he does not use drugs.  Medications:  Prior to Admission medications   Medication Sig Start Date End Date Taking? Authorizing Provider  amLODipine (NORVASC) 10 MG tablet Take 10 mg by mouth daily.   Yes Historical Provider, MD  buPROPion (WELLBUTRIN XL) 300 MG 24 hr tablet Take 300 mg by mouth daily.   Yes Historical Provider, MD  Multiple Vitamins-Minerals (MULTIVITAMIN & MINERAL PO) Take by mouth. Reported on 01/18/2016   Yes Historical Provider, MD  ondansetron (ZOFRAN) 8 MG tablet Take 1 tablet (8 mg total) by mouth every 8 (eight) hours as needed for nausea or vomiting. 01/07/16  Yes Lori P Hvozdovic, PA-C  rosuvastatin (CRESTOR) 20 MG tablet Take 1  tablet (20 mg total) by mouth daily. 09/03/16  Yes Robyn Haber, MD  tadalafil (CIALIS) 10 MG tablet Take 0.5 tablets (5 mg total) by mouth daily as needed for erectile dysfunction. 11/28/15  Yes Gay Filler Copland, MD  valsartan (DIOVAN) 160 MG tablet Take 1 tablet (160 mg total) by mouth daily. 07/11/16  Yes Sedalia Muta, FNP      Allergies  Allergen Reactions  . Ace Inhibitors Other (See Comments)    ANGIOEDEMA OF JOINTS- SEVERE    ROS:  Out of a complete 14 system review of symptoms, the patient complains only of the following  symptoms, and all other reviewed systems are negative:   Memory loss, confusion Depression, anxiety, not enough sleep, decreased energy, disinterest in activities, racing thoughts Insomnia     Blood pressure (!) 160/97, pulse 93, height 5\' 11"  (1.803 m), weight 229 lb (103.9 kg).Memory loss, confusion  Physical Exam  General: The patient is alert and cooperative at the time of the examination.  Eyes: Pupils are equal, round, and reactive to light. Discs are flat bilaterally.  Neck: The neck is supple, no carotid bruits are noted.  Respiratory: The respiratory examination is clear.  Cardiovascular: The cardiovascular examination reveals a regular rate and rhythm, no obvious murmurs or rubs are noted.  Skin: Extremities are without significant edema.  Neurologic Exam  Mental status: The patient is alert and oriented x 3 at the time of the examination. The patient has apparent normal recent and remote memory, with an apparently normal attention span and concentration ability. Mini-Mental Status Examination done today shows a total score of 28/30.  Cranial nerves: Facial symmetry is present. There is good sensation of the face to pinprick and soft touch bilaterally. The strength of the facial muscles and the muscles to head turning and shoulder shrug are normal bilaterally. Speech is well enunciated, no aphasia or dysarthria is noted. Extraocular movements are full. Visual fields are full. The tongue is midline, and the patient has symmetric elevation of the soft palate. No obvious hearing deficits are noted.  Motor: The motor testing reveals 5 over 5 strength of all 4 extremities. Good symmetric motor tone is noted throughout.  Sensory: Sensory testing is intact to pinprick, soft touch, vibration sensation, and position sense on all 4 extremities. No evidence of extinction is noted.  Coordination: Cerebellar testing reveals good finger-nose-finger and heel-to-shin  bilaterally.  Gait and station: Gait is normal. Tandem gait is normal. Romberg is negative. No drift is seen.  Reflexes: Deep tendon reflexes are symmetric and normal bilaterally. Toes are downgoing bilaterally.   Assessment/Plan:  1. Anxiety and depression  2. Reports of memory disturbance  3. Possible hemachromatosis  The patient will be set up for blood work today. Hemochromatosis can result in a hepatic encephalopathy, but there is no evidence of significant hepatic dysfunction on blood work. The patient is on Crestor which can on rare occasions cause an encephalopathy, the patient believes that there is a time correlation with the onset of use of Crestor and the memory problems, cessation of the medication for 3 or 4 months may be warranted. The patient will undergo MRI of the brain and he will be set up for neuropsychological evaluation. He will follow-up in 6 months. The memory issues will be followed over time. The patient is under stress at this time and depression may be adversely affecting his cognitive function.   Jill Alexanders MD 09/26/2016 9:32 AM  [facility] [address]  [place], [place] [address]  Phone [phone] Fax [phone]

## 2016-09-27 LAB — AMMONIA: AMMONIA: 61 ug/dL (ref 27–102)

## 2016-09-27 LAB — SEDIMENTATION RATE: SED RATE: 15 mm/h (ref 0–30)

## 2016-09-27 LAB — HIV ANTIBODY (ROUTINE TESTING W REFLEX): HIV Screen 4th Generation wRfx: NONREACTIVE

## 2016-09-27 LAB — RPR: RPR: NONREACTIVE

## 2016-09-27 LAB — TSH: TSH: 3.38 u[IU]/mL (ref 0.450–4.500)

## 2016-09-28 ENCOUNTER — Telehealth: Payer: Self-pay

## 2016-09-28 NOTE — Telephone Encounter (Signed)
Called pt w/ unremarkable lab results. Verbalized understanding and appreciation for call. 

## 2016-09-28 NOTE — Telephone Encounter (Signed)
-----   Message from Kathrynn Ducking, MD sent at 09/27/2016  5:16 PM EDT -----  The blood work results are unremarkable. Please call the patient.  ----- Message ----- From: Lavone Neri Lab Results In Sent: 09/27/2016   7:41 AM To: Kathrynn Ducking, MD

## 2016-10-05 ENCOUNTER — Other Ambulatory Visit: Payer: Self-pay | Admitting: Emergency Medicine

## 2016-10-05 MED ORDER — VALSARTAN 160 MG PO TABS: 160.0000 mg | ORAL_TABLET | Freq: Every day | ORAL | 3 refills | Status: DC

## 2016-10-05 MED ORDER — VALSARTAN 160 MG PO TABS: 160.0000 mg | ORAL_TABLET | Freq: Every day | ORAL | 1 refills | Status: DC

## 2016-10-05 MED FILL — VALSARTAN 160 MG TABLET: 160 | 90 days supply | Qty: 90 | Fill #0

## 2016-10-07 ENCOUNTER — Encounter: Payer: Self-pay | Admitting: Hematology

## 2016-10-07 ENCOUNTER — Telehealth: Payer: Self-pay | Admitting: Hematology

## 2016-10-07 ENCOUNTER — Ambulatory Visit (HOSPITAL_BASED_OUTPATIENT_CLINIC_OR_DEPARTMENT_OTHER): Payer: 59 | Admitting: Hematology

## 2016-10-07 ENCOUNTER — Ambulatory Visit (HOSPITAL_BASED_OUTPATIENT_CLINIC_OR_DEPARTMENT_OTHER): Payer: 59

## 2016-10-07 ENCOUNTER — Other Ambulatory Visit (HOSPITAL_BASED_OUTPATIENT_CLINIC_OR_DEPARTMENT_OTHER): Payer: 59

## 2016-10-07 DIAGNOSIS — R42 Dizziness and giddiness: Secondary | ICD-10-CM | POA: Diagnosis not present

## 2016-10-07 DIAGNOSIS — R7989 Other specified abnormal findings of blood chemistry: Secondary | ICD-10-CM

## 2016-10-07 LAB — COMPREHENSIVE METABOLIC PANEL
ALT: 34 U/L (ref 0–55)
AST: 25 U/L (ref 5–34)
Albumin: 4 g/dL (ref 3.5–5.0)
Alkaline Phosphatase: 53 U/L (ref 40–150)
Anion Gap: 12 mEq/L — ABNORMAL HIGH (ref 3–11)
BUN: 20.2 mg/dL (ref 7.0–26.0)
CHLORIDE: 104 meq/L (ref 98–109)
CO2: 23 meq/L (ref 22–29)
Calcium: 9.5 mg/dL (ref 8.4–10.4)
Creatinine: 1.2 mg/dL (ref 0.7–1.3)
EGFR: 71 mL/min/{1.73_m2} — ABNORMAL LOW (ref >90)
GLUCOSE: 108 mg/dL (ref 70–140)
POTASSIUM: 4.2 meq/L (ref 3.5–5.1)
SODIUM: 139 meq/L (ref 136–145)
Total Bilirubin: 0.38 mg/dL (ref 0.20–1.20)
Total Protein: 7.4 g/dL (ref 6.4–8.3)

## 2016-10-07 LAB — CBC & DIFF AND RETIC
BASO%: 0.3 % (ref 0.0–2.0)
Basophils Absolute: 0 10*3/uL (ref 0.0–0.1)
EOS ABS: 0.1 10*3/uL (ref 0.0–0.5)
EOS%: 1.3 % (ref 0.0–7.0)
HCT: 38.6 % (ref 38.4–49.9)
HEMOGLOBIN: 13.2 g/dL (ref 13.0–17.1)
Immature Retic Fract: 8.3 % (ref 3.00–10.60)
LYMPH%: 27.3 % (ref 14.0–49.0)
MCH: 31.9 pg (ref 27.2–33.4)
MCHC: 34.2 g/dL (ref 32.0–36.0)
MCV: 93.2 fL (ref 79.3–98.0)
MONO#: 0.6 10*3/uL (ref 0.1–0.9)
MONO%: 9.1 % (ref 0.0–14.0)
NEUT%: 62 % (ref 39.0–75.0)
NEUTROS ABS: 4.3 10*3/uL (ref 1.5–6.5)
Platelets: 253 10*3/uL (ref 140–400)
RBC: 4.14 10*6/uL — ABNORMAL LOW (ref 4.20–5.82)
RDW: 13 % (ref 11.0–14.6)
RETIC %: 1.47 % (ref 0.80–1.80)
Retic Ct Abs: 60.86 10*3/uL (ref 34.80–93.90)
WBC: 7 10*3/uL (ref 4.0–10.3)
lymph#: 1.9 10*3/uL (ref 0.9–3.3)

## 2016-10-07 LAB — FERRITIN: Ferritin: 497 ng/ml — ABNORMAL HIGH (ref 22–316)

## 2016-10-07 NOTE — Patient Instructions (Signed)

## 2016-10-07 NOTE — Telephone Encounter (Signed)
GAVE PATIENT AVS REPORT AND APPOINTMENTS FOR October THRU December  °

## 2016-10-12 ENCOUNTER — Other Ambulatory Visit: Payer: 59

## 2016-10-17 ENCOUNTER — Ambulatory Visit (INDEPENDENT_AMBULATORY_CARE_PROVIDER_SITE_OTHER): Payer: 59 | Admitting: Family Medicine

## 2016-10-17 ENCOUNTER — Ambulatory Visit (INDEPENDENT_AMBULATORY_CARE_PROVIDER_SITE_OTHER): Payer: 59

## 2016-10-17 VITALS — BP 134/78 | HR 107 | Temp 98.6°F | Resp 18 | Ht 71.0 in | Wt 224.0 lb

## 2016-10-17 DIAGNOSIS — M5442 Lumbago with sciatica, left side: Secondary | ICD-10-CM | POA: Diagnosis not present

## 2016-10-17 DIAGNOSIS — M47816 Spondylosis without myelopathy or radiculopathy, lumbar region: Secondary | ICD-10-CM | POA: Diagnosis not present

## 2016-10-17 MED ORDER — PREDNISONE 20 MG PO TABS: ORAL_TABLET | ORAL | 0 refills | Status: DC

## 2016-10-17 MED ORDER — CYCLOBENZAPRINE HCL 5 MG PO TABS: ORAL_TABLET | ORAL | 0 refills | Status: DC

## 2016-10-17 MED ORDER — HYDROCODONE-ACETAMINOPHEN 5-325 MG PO TABS: 1.0000 | ORAL_TABLET | Freq: Four times a day (QID) | ORAL | 0 refills | Status: DC | PRN

## 2016-10-17 MED FILL — predniSONE 20 MG TABS: 20 | 9 days supply | Qty: 16 | Fill #0

## 2016-10-17 MED FILL — CYCLOBENZAPRINE 5 MG TABLET: 5 | 5 days supply | Qty: 15 | Fill #0

## 2016-10-17 NOTE — Progress Notes (Addendum)
Subjective:  By signing my name below, I, Moises Blood, attest that this documentation has been prepared under the direction and in the presence of Merri Ray, MD. Electronically Signed: Moises Blood, [facility]. 10/17/2016 , 3:45 PM .  Patient was seen in Room 3 .   Patient ID: Blake Roberts, male    DOB: 11/02/1966, 50 y.o.   MRN: [id number] Chief Complaint  Patient presents with  . Back Pain    bend over to tie shoe and when standing back up pain occurred  . bulging disc L5-S1    hx of back pain    HPI Blake Roberts is a 50 y.o. male Here for back pain. H/o low back issues, with reported L5-S1 bulging disc (year 1999). He had a couple injections for relief with Dr. Eulas Post.   Patient states he bent over to tie his shoe yesterday, and when he stood up, he felt a sharp pain in his back. He notes pain across his back, like "someone sticking a knife into his hips, L>R". He did feel a giving sensation when he stood up. He's been limping with ambulation. He does have a history of occasional back pain for a few days, and takes muscle relaxants for a few days for relief. He's been limping with ambulation. He denies bowel or urinary incontinence, saddle anesthesia, or weakness into his lower extremities. He denies any cough. He denies having any problems taking hydrocodone or coming off of it. He denies taking NSAID's, as it causes upset stomach. He denies any issues taking prednisone.   Outside records reviewed: X-ray results reviewed from lumbar spine x-ray at [facility] on 07/15/2015. Moderate multilevel DDD of the entire lumbar spine.   Patient Active Problem List   Diagnosis Date Noted  . Memory difficulty 09/26/2016  . Hemochromatosis 07/11/2016  . Elevated blood pressure 05/24/2016  . Episodic lightheadedness 12/25/2015  . Belly pain 12/25/2015  . Anemia, unspecified 12/25/2015   Past Medical History:  Diagnosis Date  . Acute meniscal tear of knee RIGHT KNEE  .  Anxiety   . Arthritis RIGHT KNEE  . Bulging lumbar disc L5 - S1  . Depression   . Hemochromatosis   . Hyperlipidemia   . Hypertension    Past Surgical History:  Procedure Laterality Date  . KNEE ARTHROSCOPY  AGE 22 (APPROX)   RIGHT KNEE  . KNEE ARTHROSCOPY  07/13/2012   Procedure: ARTHROSCOPY KNEE;  Surgeon: Magnus Sinning, MD;  Location: [place];  Service: Orthopedics;  Laterality: Right;  WITH PARTIAL MEDIAL MENISECTOMY AND REMOVAL OF LOOSE BODIES  . torn rotator cuff     Allergies  Allergen Reactions  . Ace Inhibitors Other (See Comments)    ANGIOEDEMA OF JOINTS- SEVERE   Prior to Admission medications   Medication Sig Start Date End Date Taking? Authorizing Provider  amLODipine (NORVASC) 10 MG tablet Take 10 mg by mouth daily.   Yes Historical Provider, MD  buPROPion (WELLBUTRIN XL) 300 MG 24 hr tablet Take 300 mg by mouth daily.   Yes Historical Provider, MD  ondansetron (ZOFRAN) 8 MG tablet Take 1 tablet (8 mg total) by mouth every 8 (eight) hours as needed for nausea or vomiting. 01/07/16  Yes Lori P Hvozdovic, PA-C  rosuvastatin (CRESTOR) 20 MG tablet Take 1 tablet (20 mg total) by mouth daily. 09/03/16  Yes Robyn Haber, MD  tadalafil (CIALIS) 10 MG tablet Take 0.5 tablets (5 mg total) by mouth daily as needed for erectile dysfunction.  11/28/15  Yes Gay Filler Copland, MD  valsartan (DIOVAN) 160 MG tablet Take 1 tablet (160 mg total) by mouth daily. 10/05/16  Yes Sedalia Muta, FNP   Social History   Social History  . Marital status: Legally Separated    Spouse name: N/A  . Number of children: N/A  . Years of education: BA   Occupational History  . [employer]    Social History Main Topics  . Smoking status: Never Smoker  . Smokeless tobacco: Never Used  . Alcohol use Yes     Comment: Occasional beer  . Drug use: No  . Sexual activity: Yes   Other Topics Concern  . Not on file   Social History Narrative   Lives at home  alone   Separated, 2 step-daughters   Right-handed   Caffeine: 2 diet Cokes per day   Review of Systems  Constitutional: Negative for chills, fatigue and fever.  Musculoskeletal: Positive for arthralgias, back pain, gait problem and myalgias. Negative for neck pain and neck stiffness.  Neurological: Negative for weakness and numbness.       Objective:   Physical Exam  Constitutional: He is oriented to person, place, and time. He appears well-developed and well-nourished. No distress.  HENT:  Head: Normocephalic and atraumatic.  Eyes: EOM are normal. Pupils are equal, round, and reactive to light.  Neck: Neck supple.  Cardiovascular: Normal rate.   Pulmonary/Chest: Effort normal. No respiratory distress.  Musculoskeletal: Normal range of motion.  Tender along L3-5 region of his lumbar spine, as well as to the left sciatic notch; minimal paraspinal tenderness primarily left over right; flexion to approximately 80 degrees, limited extension, minimal left lateral flexion, intact right lateral flexion; guarded right rotation, left rotation intact; positive seated straight leg raise on the left with pain to thigh, positive seated straight leg raise on the right with pain to the back only  Neurological: He is alert and oriented to person, place, and time. He displays no Babinski's sign on the right side. He displays no Babinski's sign on the left side.  Reflex Scores:      Patellar reflexes are 2+ on the right side and 2+ on the left side.      Achilles reflexes are 2+ on the right side and 2+ on the left side. Able to heel and toe walk without weakness  Skin: Skin is warm and dry.  Psychiatric: He has a normal mood and affect. His behavior is normal.  Nursing note and vitals reviewed.   Vitals:   10/17/16 1506  BP: 134/78  Pulse: (!) 107  Resp: 18  Temp: 98.6 F (37 C)  TempSrc: Oral  SpO2: 95%  Weight: 224 lb (101.6 kg)  Height: 5\' 11"  (1.803 m)   Dg Lumbar Spine 2-3  Views  Result Date: 10/17/2016 CLINICAL DATA:  Acute left lower back pain.  Left side sciatica. EXAM: LUMBAR SPINE - 2-3 VIEW COMPARISON:  07/23/2010 FINDINGS: Mild rightward scoliosis in the mid lumbar spine. Associated degenerative disc and facet disease throughout the lumbar spine. No fracture. SI joints are symmetric and unremarkable. IMPRESSION: Rightward scoliosis with degenerative changes.  No acute findings. Electronically Signed   By: Rolm Baptise M.D.   On: 10/17/2016 16:00      Assessment & Plan:    Blake Roberts is a 50 y.o. male Acute left-sided low back pain with left-sided sciatica - Plan: DG Lumbar Spine 2-3 Views  History of degenerative disc disease, with symptoms since  yesterday morning suspicious for herniated versus bulging disc. Neurovascular intact distally, no cauda equina symptoms. Intolerant to NSAIDs due to GI upset, but has tolerated prednisone.  -Start prednisone taper, side effects discussed.  -Flexeril every 8 hours as needed, side effects discussed.  -Hydrocodone provided if needed for breakthrough pain, side effects and additive sedation precautions given with other medications.  -if not improving within the next 1 week, or worsening sooner, return for recheck.  Meds ordered this encounter  Medications  . cyclobenzaprine (FLEXERIL) 5 MG tablet    Sig: 1 pill by mouth up to every 8 hours as needed. Start with one pill by mouth each bedtime as needed due to sedation    Dispense:  15 tablet    Refill:  0  . HYDROcodone-acetaminophen (NORCO/VICODIN) 5-325 MG tablet    Sig: Take 1 tablet by mouth every 6 (six) hours as needed for moderate pain.    Dispense:  15 tablet    Refill:  0  . predniSONE (DELTASONE) 20 MG tablet    Sig: 3 by mouth for 3 days, then 2 by mouth for 2 days, then 1 by mouth for 2 days, then 1/2 by mouth for 2 days.    Dispense:  16 tablet    Refill:  0   Patient Instructions    Based on your symptoms, it is possible that you have  another herniated disc or bulging disc. As you have not tolerated NSAIDs, we can try prednisone initially 3 pills per day, then taper. Flexeril as needed up to every 8 hours, but be careful as this can cause sedation. If you do have breakthrough pain that is not relieved with these medications, I did write a short course of hydrocodone, but again be careful combining this with other sedating medicines like the Flexeril. If you are feeling better into tomorrow and feel like you can work, that is fine, otherwise you have a letter to be out the next 2 nights if needed. If you're not improving later this week or into next week, or any worsening sooner, return for recheck.   Back Pain, Adult Back pain is very common in adults.The cause of back pain is rarely dangerous and the pain often gets better over time.The cause of your back pain may not be known. Some common causes of back pain include:  Strain of the muscles or ligaments supporting the spine.  Wear and tear (degeneration) of the spinal disks.  Arthritis.  Direct injury to the back. For many people, back pain may return. Since back pain is rarely dangerous, most people can learn to manage this condition on their own. HOME CARE INSTRUCTIONS Watch your back pain for any changes. The following actions may help to lessen any discomfort you are feeling:  Remain active. It is stressful on your back to sit or stand in one place for long periods of time. Do not sit, drive, or stand in one place for more than 30 minutes at a time. Take short walks on even surfaces as soon as you are able.Try to increase the length of time you walk each day.  Exercise regularly as directed by your health care provider. Exercise helps your back heal faster. It also helps avoid future injury by keeping your muscles strong and flexible.  Do not stay in bed.Resting more than 1-2 days can delay your recovery.  Pay attention to your body when you bend and lift. The most  comfortable positions are those that put less stress  on your recovering back. Always use proper lifting techniques, including:  Bending your knees.  Keeping the load close to your body.  Avoiding twisting.  Find a comfortable position to sleep. Use a firm mattress and lie on your side with your knees slightly bent. If you lie on your back, put a pillow under your knees.  Avoid feeling anxious or stressed.Stress increases muscle tension and can worsen back pain.It is important to recognize when you are anxious or stressed and learn ways to manage it, such as with exercise.  Take medicines only as directed by your health care provider. Over-the-counter medicines to reduce pain and inflammation are often the most helpful.Your health care provider may prescribe muscle relaxant drugs.These medicines help dull your pain so you can more quickly return to your normal activities and healthy exercise.  Apply ice to the injured area:  Put ice in a plastic bag.  Place a towel between your skin and the bag.  Leave the ice on for 20 minutes, 2-3 times a day for the first 2-3 days. After that, ice and heat may be alternated to reduce pain and spasms.  Maintain a healthy weight. Excess weight puts extra stress on your back and makes it difficult to maintain good posture. SEEK MEDICAL CARE IF:  You have pain that is not relieved with rest or medicine.  You have increasing pain going down into the legs or buttocks.  You have pain that does not improve in one week.  You have night pain.  You lose weight.  You have a fever or chills. SEEK IMMEDIATE MEDICAL CARE IF:   You develop new bowel or bladder control problems.  You have unusual weakness or numbness in your arms or legs.  You develop nausea or vomiting.  You develop abdominal pain.  You feel faint.   This information is not intended to replace advice given to you by your health care provider. Make sure you discuss any questions  you have with your health care provider.   Document Released: 12/12/2005 Document Revised: 01/02/2015 Document Reviewed: 04/15/2014 Elsevier Interactive Patient Education 2016 [employer].   IF you received an x-ray today, you will receive an invoice from [facility]. Please contact [facility] at [phone] with questions or concerns regarding your invoice.   IF you received labwork today, you will receive an invoice from [employer]. Please contact [facility] at [phone] with questions or concerns regarding your invoice.   Our billing staff will not be able to assist you with questions regarding bills from these companies.  You will be contacted with the lab results as soon as they are available. The fastest way to get your results is to activate your My Chart account. Instructions are located on the last page of this paperwork. If you have not heard from [place] regarding the results in 2 weeks, please contact this office.        I personally performed the services described in this documentation, which was scribed in my presence. The recorded information has been reviewed and considered, and addended by me as needed.   Signed,   Merri Ray, MD [facility].  10/17/16 4:16 PM

## 2016-10-17 NOTE — Patient Instructions (Addendum)
Based on your symptoms, it is possible that you have another herniated disc or bulging disc. As you have not tolerated NSAIDs, we can try prednisone initially 3 pills per day, then taper. Flexeril as needed up to every 8 hours, but be careful as this can cause sedation. If you do have breakthrough pain that is not relieved with these medications, I did write a short course of hydrocodone, but again be careful combining this with other sedating medicines like the Flexeril. If you are feeling better into tomorrow and feel like you can work, that is fine, otherwise you have a letter to be out the next 2 nights if needed. If you're not improving later this week or into next week, or any worsening sooner, return for recheck.   Back Pain, Adult Back pain is very common in adults.The cause of back pain is rarely dangerous and the pain often gets better over time.The cause of your back pain may not be known. Some common causes of back pain include:  Strain of the muscles or ligaments supporting the spine.  Wear and tear (degeneration) of the spinal disks.  Arthritis.  Direct injury to the back. For many people, back pain may return. Since back pain is rarely dangerous, most people can learn to manage this condition on their own. HOME CARE INSTRUCTIONS Watch your back pain for any changes. The following actions may help to lessen any discomfort you are feeling:  Remain active. It is stressful on your back to sit or stand in one place for long periods of time. Do not sit, drive, or stand in one place for more than 30 minutes at a time. Take short walks on even surfaces as soon as you are able.Try to increase the length of time you walk each day.  Exercise regularly as directed by your health care provider. Exercise helps your back heal faster. It also helps avoid future injury by keeping your muscles strong and flexible.  Do not stay in bed.Resting more than 1-2 days can delay your recovery.  Pay  attention to your body when you bend and lift. The most comfortable positions are those that put less stress on your recovering back. Always use proper lifting techniques, including:  Bending your knees.  Keeping the load close to your body.  Avoiding twisting.  Find a comfortable position to sleep. Use a firm mattress and lie on your side with your knees slightly bent. If you lie on your back, put a pillow under your knees.  Avoid feeling anxious or stressed.Stress increases muscle tension and can worsen back pain.It is important to recognize when you are anxious or stressed and learn ways to manage it, such as with exercise.  Take medicines only as directed by your health care provider. Over-the-counter medicines to reduce pain and inflammation are often the most helpful.Your health care provider may prescribe muscle relaxant drugs.These medicines help dull your pain so you can more quickly return to your normal activities and healthy exercise.  Apply ice to the injured area:  Put ice in a plastic bag.  Place a towel between your skin and the bag.  Leave the ice on for 20 minutes, 2-3 times a day for the first 2-3 days. After that, ice and heat may be alternated to reduce pain and spasms.  Maintain a healthy weight. Excess weight puts extra stress on your back and makes it difficult to maintain good posture. SEEK MEDICAL CARE IF:  You have pain that is not relieved  with rest or medicine.  You have increasing pain going down into the legs or buttocks.  You have pain that does not improve in one week.  You have night pain.  You lose weight.  You have a fever or chills. SEEK IMMEDIATE MEDICAL CARE IF:   You develop new bowel or bladder control problems.  You have unusual weakness or numbness in your arms or legs.  You develop nausea or vomiting.  You develop abdominal pain.  You feel faint.   This information is not intended to replace advice given to you by your  health care provider. Make sure you discuss any questions you have with your health care provider.   Document Released: 12/12/2005 Document Revised: 01/02/2015 Document Reviewed: 04/15/2014 Elsevier Interactive Patient Education 2016 [employer].   IF you received an x-ray today, you will receive an invoice from [facility]. Please contact [facility] at [phone] with questions or concerns regarding your invoice.   IF you received labwork today, you will receive an invoice from [employer]. Please contact [facility] at [phone] with questions or concerns regarding your invoice.   Our billing staff will not be able to assist you with questions regarding bills from these companies.  You will be contacted with the lab results as soon as they are available. The fastest way to get your results is to activate your My Chart account. Instructions are located on the last page of this paperwork. If you have not heard from [place] regarding the results in 2 weeks, please contact this office.

## 2016-10-18 MED FILL — HYDROCODON-APAP 5-325: 5-325 | 4 days supply | Qty: 15 | Fill #0

## 2016-10-18 MED FILL — ROSUVASTATIN CALCIUM 20 MG: 20 | 30 days supply | Qty: 30 | Fill #1

## 2016-10-18 MED FILL — BUPROPION HCL XL 300 MG TAB: 300 | 30 days supply | Qty: 30 | Fill #2

## 2016-10-20 ENCOUNTER — Ambulatory Visit (INDEPENDENT_AMBULATORY_CARE_PROVIDER_SITE_OTHER): Payer: 59 | Admitting: Family Medicine

## 2016-10-20 VITALS — BP 118/70 | HR 98 | Temp 97.9°F | Resp 18 | Wt 222.0 lb

## 2016-10-20 DIAGNOSIS — M5442 Lumbago with sciatica, left side: Secondary | ICD-10-CM

## 2016-10-20 NOTE — Progress Notes (Signed)
Subjective:    Patient ID: Blake Roberts, male    DOB: 1966-10-02, 50 y.o.   MRN: [id number]  10/20/2016  Back Pain (f/u work status  no new concerns.)   HPI This 50 y.o. male presents for follow-up of acute lower back pain with L sciatica.  Dr. Carlota Raspberry wrote a work letter excusing patient from work 10/23-10/25/17; pt now needing a return to work note. Feels much better if not moving much. At rest 1-2/10.  With movement, 6-7/10.  Gets a sharp pain intermittently. Taking Prednisone as prescribed; taking Flexeril bid; taking Hydrocodone qhs.  Current work schedule strictly nights 7p-7a.  Next schedule 3 day run starts Sunday.  Spoke with boss; either 100% cleared or not.  No light duty.  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  History of intermittent lower back pain; first episode of lower back pain in 1999.  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  Has been working on ambulance for 19 years now.    Review of Systems  Constitutional: Negative for chills, diaphoresis, fatigue and fever.  Genitourinary: Negative for decreased urine volume and difficulty urinating.  Musculoskeletal: Positive for back pain, gait problem and myalgias.  Neurological: Negative for weakness and numbness.    Past Medical History:  Diagnosis Date  . Acute meniscal tear of knee RIGHT KNEE  . Anxiety   . Arthritis RIGHT KNEE  . Bulging lumbar disc L5 - S1  . Depression   . Hemochromatosis   . Hyperlipidemia   . Hypertension    Past Surgical History:  Procedure Laterality Date  . KNEE ARTHROSCOPY  AGE 20 (APPROX)   RIGHT KNEE  . KNEE ARTHROSCOPY  07/13/2012   Procedure: ARTHROSCOPY KNEE;  Surgeon: Magnus Sinning, MD;  Location: [place];  Service: Orthopedics;  Laterality: Right;  WITH PARTIAL MEDIAL MENISECTOMY AND REMOVAL OF LOOSE BODIES  . torn rotator cuff     Allergies  Allergen Reactions  . Ace Inhibitors Other  (See Comments)    ANGIOEDEMA OF JOINTS- SEVERE   Current Outpatient Prescriptions  Medication Sig Dispense Refill  . amLODipine (NORVASC) 10 MG tablet Take 10 mg by mouth daily.    Marland Kitchen buPROPion (WELLBUTRIN XL) 300 MG 24 hr tablet Take 300 mg by mouth daily.    . cyclobenzaprine (FLEXERIL) 5 MG tablet 1 pill by mouth up to every 8 hours as needed. Start with one pill by mouth each bedtime as needed due to sedation 15 tablet 0  . HYDROcodone-acetaminophen (NORCO/VICODIN) 5-325 MG tablet Take 1 tablet by mouth every 6 (six) hours as needed for moderate pain. 15 tablet 0  . ondansetron (ZOFRAN) 8 MG tablet Take 1 tablet (8 mg total) by mouth every 8 (eight) hours as needed for nausea or vomiting. 60 tablet 1  . predniSONE (DELTASONE) 20 MG tablet 3 by mouth for 3 days, then 2 by mouth for 2 days, then 1 by mouth for 2 days, then 1/2 by mouth for 2 days. 16 tablet 0  . rosuvastatin (CRESTOR) 20 MG tablet Take 1 tablet (20 mg total) by mouth daily. 30 tablet 6  . tadalafil (CIALIS) 10 MG tablet Take 0.5 tablets (5 mg total) by mouth daily as needed for erectile dysfunction. 10 tablet 9  . valsartan (DIOVAN) 160 MG tablet Take 1 tablet (160 mg total) by mouth daily. 30 tablet 1   No current facility-administered medications for this visit.  Social History   Social History  . Marital status: Legally Separated    Spouse name: N/A  . Number of children: N/A  . Years of education: BA   Occupational History  . [employer]    Social History Main Topics  . Smoking status: Never Smoker  . Smokeless tobacco: Never Used  . Alcohol use Yes     Comment: Occasional beer  . Drug use: No  . Sexual activity: Yes   Other Topics Concern  . Not on file   Social History Narrative   Lives at home alone   Separated, 2 step-daughters   Right-handed   Caffeine: 2 diet Cokes per day   Family History  Problem Relation Age of Onset  . Cancer Mother   . Diabetes Father   . Heart disease Father   .  Hyperlipidemia Father   . Hypertension Father   . Kidney disease Father   . Colon cancer Neg Hx        Objective:    BP 118/70 (BP Location: Left Arm, Cuff Size: Large)   Pulse 98   Temp 97.9 F (36.6 C) (Other (Comment))   Resp 18   Wt 222 lb (100.7 kg)   SpO2 99%   BMI 30.96 kg/m  Physical Exam  Constitutional: He is oriented to person, place, and time. He appears well-developed and well-nourished. No distress.  HENT:  Head: Normocephalic and atraumatic.  Eyes: Conjunctivae and EOM are normal. Pupils are equal, round, and reactive to light.  Neck: Normal range of motion. Neck supple. Carotid bruit is not present. No thyromegaly present.  Cardiovascular: Normal rate, regular rhythm, normal heart sounds and intact distal pulses.  Exam reveals no gallop and no friction rub.   No murmur heard. Pulmonary/Chest: Effort normal and breath sounds normal. He has no wheezes. He has no rales.  Musculoskeletal:       Lumbar back: He exhibits decreased range of motion, tenderness and pain. He exhibits no bony tenderness and no spasm.  Lumbar spine:  Non-tender midline; +tender paraspinal regions L.  Straight leg raises positive LLE with pain radiating into L hip and L quadricep; toe and heel walking intact; marching intact; motor 5/5 BLE.  Decreased ROM lumbar spine with extension, lateral bending, rotation side to side.     Lymphadenopathy:    He has no cervical adenopathy.  Neurological: He is alert and oriented to person, place, and time. No cranial nerve deficit.  Skin: Skin is warm and dry. No rash noted. He is not diaphoretic.  Psychiatric: He has a normal mood and affect. His behavior is normal.  Nursing note and vitals reviewed.       Assessment & Plan:   1. 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 -continue Prednisone, Flexeril, hydrocodone. -add ice to regimen. -ambulate frequently every  hour. -OOW until Saturday; RTC in three days for reevaluation and possible release to work.   No orders of the defined types were placed in this encounter.  No orders of the defined types were placed in this encounter.   No Follow-up on file.   Blake Roberts, M.D. [facility] [address] [place], [place]  [address] [phone] phone [phone] fax

## 2016-10-20 NOTE — Patient Instructions (Addendum)
   IF you received an x-ray today, you will receive an invoice from [facility]. Please contact [facility] at [phone] with questions or concerns regarding your invoice.   IF you received labwork today, you will receive an invoice from [facility]. Please contact [facility] at [phone] with questions or concerns regarding your invoice.   Our billing staff will not be able to assist you with questions regarding bills from these companies.  You will be contacted with the lab results as soon as they are available. The fastest way to get your results is to activate your My Chart account. Instructions are located on the last page of this paperwork. If you have not heard from us regarding the results in 2 weeks, please contact this office.     Low Back Sprain With Rehab A sprain is an injury in which a ligament is torn. The ligaments of the lower back are vulnerable to sprains. However, they are strong and require great force to be injured. These ligaments are important for stabilizing the spinal column. Sprains are classified into three categories. Grade 1 sprains cause pain, but the tendon is not lengthened. Grade 2 sprains include a lengthened ligament, due to the ligament being stretched or partially ruptured. With grade 2 sprains there is still function, although the function may be decreased. Grade 3 sprains involve a complete tear of the tendon or muscle, and function is usually impaired. SYMPTOMS   Severe pain in the lower back.  Sometimes, a feeling of a "pop," "snap," or tear, at the time of injury.  Tenderness and sometimes swelling at the injury site.  Uncommonly, bruising (contusion) within 48 hours of injury.  Muscle spasms in the back. CAUSES  Low back sprains occur when a force is placed on the ligaments that is greater than they can handle. Common causes of injury include:  Performing a stressful act while  off-balance.  Repetitive stressful activities that involve movement of the lower back.  Direct hit (trauma) to the lower back. RISK INCREASES WITH:  Contact sports (football, wrestling).  Collisions (major skiing accidents).  Sports that require throwing or lifting (baseball, weightlifting).  Sports involving twisting of the spine (gymnastics, diving, tennis, golf).  Poor strength and flexibility.  Inadequate protection.  Previous back injury or surgery (especially fusion). PREVENTION  Wear properly fitted and padded protective equipment.  Warm up and stretch properly before activity.  Allow for adequate recovery between workouts.  Maintain physical fitness:  Strength, flexibility, and endurance.  Cardiovascular fitness.  Maintain a healthy body weight. PROGNOSIS  If treated properly, low back sprains usually heal with non-surgical treatment. The length of time for healing depends on the severity of the injury.  RELATED COMPLICATIONS   Recurring symptoms, resulting in a chronic problem.  Chronic inflammation and pain in the low back.  Delayed healing or resolution of symptoms, especially if activity is resumed too soon.  Prolonged impairment.  Unstable or arthritic joints of the low back. TREATMENT  Treatment first involves the use of ice and medicine, to reduce pain and inflammation. The use of strengthening and stretching exercises may help reduce pain with activity. These exercises may be performed at home or with a therapist. Severe injuries may require referral to a therapist for further evaluation and treatment, such as ultrasound. Your caregiver may advise that you wear a back brace or corset, to help reduce pain and discomfort. Often, prolonged bed rest results in greater harm then benefit. Corticosteroid injections may   be recommended. However, these should be reserved for the most serious cases. It is important to avoid using your back when lifting objects.  At night, sleep on your back on a firm mattress, with a pillow placed under your knees. If non-surgical treatment is unsuccessful, surgery may be needed.  MEDICATION   If pain medicine is needed, nonsteroidal anti-inflammatory medicines (aspirin and ibuprofen), or other minor pain relievers (acetaminophen), are often advised.  Do not take pain medicine for 7 days before surgery.  Prescription pain relievers may be given, if your caregiver thinks they are needed. Use only as directed and only as much as you need.  Ointments applied to the skin may be helpful.  Corticosteroid injections may be given by your caregiver. These injections should be reserved for the most serious cases, because they may only be given a certain number of times. HEAT AND COLD  Cold treatment (icing) should be applied for 10 to 15 minutes every 2 to 3 hours for inflammation and pain, and immediately after activity that aggravates your symptoms. Use ice packs or an ice massage.  Heat treatment may be used before performing stretching and strengthening activities prescribed by your caregiver, physical therapist, or athletic trainer. Use a heat pack or a warm water soak. SEEK MEDICAL CARE IF:   Symptoms get worse or do not improve in 2 to 4 weeks, despite treatment.  You develop numbness or weakness in either leg.  You lose bowel or bladder function.  Any of the following occur after surgery: fever, increased pain, swelling, redness, drainage of fluids, or bleeding in the affected area.  New, unexplained symptoms develop. (Drugs used in treatment may produce side effects.) EXERCISES  RANGE OF MOTION (ROM) AND STRETCHING EXERCISES - Low Back Sprain Most people with lower back pain will find that their symptoms get worse with excessive bending forward (flexion) or arching at the lower back (extension). The exercises that will help resolve your symptoms will focus on the opposite motion.  Your physician, physical  therapist or athletic trainer will help you determine which exercises will be most helpful to resolve your lower back pain. Do not complete any exercises without first consulting with your caregiver. Discontinue any exercises which make your symptoms worse, until you speak to your caregiver. If you have pain, numbness or tingling which travels down into your buttocks, leg or foot, the goal of the therapy is for these symptoms to move closer to your back and eventually resolve. Sometimes, these leg symptoms will get better, but your lower back pain may worsen. This is often an indication of progress in your rehabilitation. Be very alert to any changes in your symptoms and the activities in which you participated in the 24 hours prior to the change. Sharing this information with your caregiver will allow him or her to most efficiently treat your condition. These exercises may help you when beginning to rehabilitate your injury. Your symptoms may resolve with or without further involvement from your physician, physical therapist or athletic trainer. While completing these exercises, remember:   Restoring tissue flexibility helps normal motion to return to the joints. This allows healthier, less painful movement and activity.  An effective stretch should be held for at least 30 seconds.  A stretch should never be painful. You should only feel a gentle lengthening or release in the stretched tissue. FLEXION RANGE OF MOTION AND STRETCHING EXERCISES: STRETCH - Flexion, Single Knee to Chest   Lie on a firm bed or floor with   both legs extended in front of you.  Keeping one leg in contact with the floor, bring your opposite knee to your chest. Hold your leg in place by either grabbing behind your thigh or at your knee.  Pull until you feel a gentle stretch in your low back. Hold __________ seconds.  Slowly release your grasp and repeat the exercise with the opposite side. Repeat __________ times. Complete  this exercise __________ times per day.  STRETCH - Flexion, Double Knee to Chest  Lie on a firm bed or floor with both legs extended in front of you.  Keeping one leg in contact with the floor, bring your opposite knee to your chest.  Tense your stomach muscles to support your back and then lift your other knee to your chest. Hold your legs in place by either grabbing behind your thighs or at your knees.  Pull both knees toward your chest until you feel a gentle stretch in your low back. Hold __________ seconds.  Tense your stomach muscles and slowly return one leg at a time to the floor. Repeat __________ times. Complete this exercise __________ times per day.  STRETCH - Low Trunk Rotation  Lie on a firm bed or floor. Keeping your legs in front of you, bend your knees so they are both pointed toward the ceiling and your feet are flat on the floor.  Extend your arms out to the side. This will stabilize your upper body by keeping your shoulders in contact with the floor.  Gently and slowly drop both knees together to one side until you feel a gentle stretch in your low back. Hold for __________ seconds.  Tense your stomach muscles to support your lower back as you bring your knees back to the starting position. Repeat the exercise to the other side. Repeat __________ times. Complete this exercise __________ times per day  EXTENSION RANGE OF MOTION AND FLEXIBILITY EXERCISES: STRETCH - Extension, Prone on Elbows   Lie on your stomach on the floor, a bed will be too soft. Place your palms about shoulder width apart and at the height of your head.  Place your elbows under your shoulders. If this is too painful, stack pillows under your chest.  Allow your body to relax so that your hips drop lower and make contact more completely with the floor.  Hold this position for __________ seconds.  Slowly return to lying flat on the floor. Repeat __________ times. Complete this exercise  __________ times per day.  RANGE OF MOTION - Extension, Prone Press Ups  Lie on your stomach on the floor, a bed will be too soft. Place your palms about shoulder width apart and at the height of your head.  Keeping your back as relaxed as possible, slowly straighten your elbows while keeping your hips on the floor. You may adjust the placement of your hands to maximize your comfort. As you gain motion, your hands will come more underneath your shoulders.  Hold this position __________ seconds.  Slowly return to lying flat on the floor. Repeat __________ times. Complete this exercise __________ times per day.  RANGE OF MOTION- Quadruped, Neutral Spine   Assume a hands and knees position on a firm surface. Keep your hands under your shoulders and your knees under your hips. You may place padding under your knees for comfort.  Drop your head and point your tailbone toward the ground below you. This will round out your lower back like an angry cat. Hold this position   for __________ seconds.  Slowly lift your head and release your tail bone so that your back sags into a large arch, like an old horse.  Hold this position for __________ seconds.  Repeat this until you feel limber in your low back.  Now, find your "sweet spot." This will be the most comfortable position somewhere between the two previous positions. This is your neutral spine. Once you have found this position, tense your stomach muscles to support your low back.  Hold this position for __________ seconds. Repeat __________ times. Complete this exercise __________ times per day.  STRENGTHENING EXERCISES - Low Back Sprain These exercises may help you when beginning to rehabilitate your injury. These exercises should be done near your "sweet spot." This is the neutral, low-back arch, somewhere between fully rounded and fully arched, that is your least painful position. When performed in this safe range of motion, these exercises  can be used for people who have either a flexion or extension based injury. These exercises may resolve your symptoms with or without further involvement from your physician, physical therapist or athletic trainer. While completing these exercises, remember:   Muscles can gain both the endurance and the strength needed for everyday activities through controlled exercises.  Complete these exercises as instructed by your physician, physical therapist or athletic trainer. Increase the resistance and repetitions only as guided.  You may experience muscle soreness or fatigue, but the pain or discomfort you are trying to eliminate should never worsen during these exercises. If this pain does worsen, stop and make certain you are following the directions exactly. If the pain is still present after adjustments, discontinue the exercise until you can discuss the trouble with your caregiver. STRENGTHENING - Deep Abdominals, Pelvic Tilt   Lie on a firm bed or floor. Keeping your legs in front of you, bend your knees so they are both pointed toward the ceiling and your feet are flat on the floor.  Tense your lower abdominal muscles to press your low back into the floor. This motion will rotate your pelvis so that your tail bone is scooping upwards rather than pointing at your feet or into the floor. With a gentle tension and even breathing, hold this position for __________ seconds. Repeat __________ times. Complete this exercise __________ times per day.  STRENGTHENING - Abdominals, Crunches   Lie on a firm bed or floor. Keeping your legs in front of you, bend your knees so they are both pointed toward the ceiling and your feet are flat on the floor. Cross your arms over your chest.  Slightly tip your chin down without bending your neck.  Tense your abdominals and slowly lift your trunk high enough to just clear your shoulder blades. Lifting higher can put excessive stress on the lower back and does not  further strengthen your abdominal muscles.  Control your return to the starting position. Repeat __________ times. Complete this exercise __________ times per day.  STRENGTHENING - Quadruped, Opposite UE/LE Lift   Assume a hands and knees position on a firm surface. Keep your hands under your shoulders and your knees under your hips. You may place padding under your knees for comfort.  Find your neutral spine and gently tense your abdominal muscles so that you can maintain this position. Your shoulders and hips should form a rectangle that is parallel with the floor and is not twisted.  Keeping your trunk steady, lift your right hand no higher than your shoulder and then your left   leg no higher than your hip. Make sure you are not holding your breath. Hold this position for __________ seconds.  Continuing to keep your abdominal muscles tense and your back steady, slowly return to your starting position. Repeat with the opposite arm and leg. Repeat __________ times. Complete this exercise __________ times per day.  STRENGTHENING - Abdominals and Quadriceps, Straight Leg Raise   Lie on a firm bed or floor with both legs extended in front of you.  Keeping one leg in contact with the floor, bend the other knee so that your foot can rest flat on the floor.  Find your neutral spine, and tense your abdominal muscles to maintain your spinal position throughout the exercise.  Slowly lift your straight leg off the floor about 6 inches for a count of 15, making sure to not hold your breath.  Still keeping your neutral spine, slowly lower your leg all the way to the floor. Repeat this exercise with each leg __________ times. Complete this exercise __________ times per day. POSTURE AND BODY MECHANICS CONSIDERATIONS - Low Back Sprain Keeping correct posture when sitting, standing or completing your activities will reduce the stress put on different body tissues, allowing injured tissues a chance to heal  and limiting painful experiences. The following are general guidelines for improved posture. Your physician or physical therapist will provide you with any instructions specific to your needs. While reading these guidelines, remember:  The exercises prescribed by your provider will help you have the flexibility and strength to maintain correct postures.  The correct posture provides the best environment for your joints to work. All of your joints have less wear and tear when properly supported by a spine with good posture. This means you will experience a healthier, less painful body.  Correct posture must be practiced with all of your activities, especially prolonged sitting and standing. Correct posture is as important when doing repetitive low-stress activities (typing) as it is when doing a single heavy-load activity (lifting). RESTING POSITIONS Consider which positions are most painful for you when choosing a resting position. If you have pain with flexion-based activities (sitting, bending, stooping, squatting), choose a position that allows you to rest in a less flexed posture. You would want to avoid curling into a fetal position on your side. If your pain worsens with extension-based activities (prolonged standing, working overhead), avoid resting in an extended position such as sleeping on your stomach. Most people will find more comfort when they rest with their spine in a more neutral position, neither too rounded nor too arched. Lying on a non-sagging bed on your side with a pillow between your knees, or on your back with a pillow under your knees will often provide some relief. Keep in mind, being in any one position for a prolonged period of time, no matter how correct your posture, can still lead to stiffness. PROPER SITTING POSTURE In order to minimize stress and discomfort on your spine, you must sit with correct posture. Sitting with good posture should be effortless for a healthy body.  Returning to good posture is a gradual process. Many people can work toward this most comfortably by using various supports until they have the flexibility and strength to maintain this posture on their own. When sitting with proper posture, your ears will fall over your shoulders and your shoulders will fall over your hips. You should use the back of the chair to support your upper back. Your lower back will be in a neutral   position, just slightly arched. You may place a small pillow or folded towel at the base of your lower back for  support.  When working at a desk, create an environment that supports good, upright posture. Without extra support, muscles tire, which leads to excessive strain on joints and other tissues. Keep these recommendations in mind: CHAIR:  A chair should be able to slide under your desk when your back makes contact with the back of the chair. This allows you to work closely.  The chair's height should allow your eyes to be level with the upper part of your monitor and your hands to be slightly lower than your elbows. BODY POSITION  Your feet should make contact with the floor. If this is not possible, use a foot rest.  Keep your ears over your shoulders. This will reduce stress on your neck and low back. INCORRECT SITTING POSTURES  If you are feeling tired and unable to assume a healthy sitting posture, do not slouch or slump. This puts excessive strain on your back tissues, causing more damage and pain. Healthier options include:  Using more support, like a lumbar pillow.  Switching tasks to something that requires you to be upright or walking.  Talking a brief walk.  Lying down to rest in a neutral-spine position. PROLONGED STANDING WHILE SLIGHTLY LEANING FORWARD  When completing a task that requires you to lean forward while standing in one place for a long time, place either foot up on a stationary 2-4 inch high object to help maintain the best posture. When  both feet are on the ground, the lower back tends to lose its slight inward curve. If this curve flattens (or becomes too large), then the back and your other joints will experience too much stress, tire more quickly, and can cause pain. CORRECT STANDING POSTURES Proper standing posture should be assumed with all daily activities, even if they only take a few moments, like when brushing your teeth. As in sitting, your ears should fall over your shoulders and your shoulders should fall over your hips. You should keep a slight tension in your abdominal muscles to brace your spine. Your tailbone should point down to the ground, not behind your body, resulting in an over-extended swayback posture.  INCORRECT STANDING POSTURES  Common incorrect standing postures include a forward head, locked knees and/or an excessive swayback. WALKING Walk with an upright posture. Your ears, shoulders and hips should all line-up. PROLONGED ACTIVITY IN A FLEXED POSITION When completing a task that requires you to bend forward at your waist or lean over a low surface, try to find a way to stabilize 3 out of 4 of your limbs. You can place a hand or elbow on your thigh or rest a knee on the surface you are reaching across. This will provide you more stability, so that your muscles do not tire as quickly. By keeping your knees relaxed, or slightly bent, you will also reduce stress across your lower back. CORRECT LIFTING TECHNIQUES DO :  Assume a wide stance. This will provide you more stability and the opportunity to get as close as possible to the object which you are lifting.  Tense your abdominals to brace your spine. Bend at the knees and hips. Keeping your back locked in a neutral-spine position, lift using your leg muscles. Lift with your legs, keeping your back straight.  Test the weight of unknown objects before attempting to lift them.  Try to keep your elbows locked down   at your sides in order get the best  strength from your shoulders when carrying an object.  Always ask for help when lifting heavy or awkward objects. INCORRECT LIFTING TECHNIQUES DO NOT:   Lock your knees when lifting, even if it is a small object.  Bend and twist. Pivot at your feet or move your feet when needing to change directions.  Assume that you can safely pick up even a paperclip without proper posture.   This information is not intended to replace advice given to you by your health care provider. Make sure you discuss any questions you have with your health care provider.   Document Released: 12/12/2005 Document Revised: 01/02/2015 Document Reviewed: 03/26/2009 [employer] Interactive Patient Education 2016 [employer]  

## 2016-10-21 ENCOUNTER — Ambulatory Visit (HOSPITAL_BASED_OUTPATIENT_CLINIC_OR_DEPARTMENT_OTHER): Payer: 59

## 2016-10-21 ENCOUNTER — Other Ambulatory Visit (HOSPITAL_BASED_OUTPATIENT_CLINIC_OR_DEPARTMENT_OTHER): Payer: 59

## 2016-10-21 DIAGNOSIS — R7989 Other specified abnormal findings of blood chemistry: Secondary | ICD-10-CM | POA: Diagnosis not present

## 2016-10-21 LAB — CBC & DIFF AND RETIC
BASO%: 0.2 % (ref 0.0–2.0)
Basophils Absolute: 0 10*3/uL (ref 0.0–0.1)
EOS%: 0.3 % (ref 0.0–7.0)
Eosinophils Absolute: 0 10*3/uL (ref 0.0–0.5)
HCT: 37.4 % — ABNORMAL LOW (ref 38.4–49.9)
HGB: 12.7 g/dL — ABNORMAL LOW (ref 13.0–17.1)
Immature Retic Fract: 9.8 % (ref 3.00–10.60)
LYMPH%: 39.3 % (ref 14.0–49.0)
MCH: 31.7 pg (ref 27.2–33.4)
MCHC: 34 g/dL (ref 32.0–36.0)
MCV: 93.3 fL (ref 79.3–98.0)
MONO#: 0.5 10*3/uL (ref 0.1–0.9)
MONO%: 7.2 % (ref 0.0–14.0)
NEUT%: 53 % (ref 39.0–75.0)
NEUTROS ABS: 3.4 10*3/uL (ref 1.5–6.5)
NRBC: 0 % (ref 0–0)
Platelets: 244 10*3/uL (ref 140–400)
RBC: 4.01 10*6/uL — AB (ref 4.20–5.82)
RDW: 13.3 % (ref 11.0–14.6)
Retic %: 2.29 % — ABNORMAL HIGH (ref 0.80–1.80)
Retic Ct Abs: 91.83 10*3/uL (ref 34.80–93.90)
WBC: 6.4 10*3/uL (ref 4.0–10.3)
lymph#: 2.5 10*3/uL (ref 0.9–3.3)

## 2016-10-21 LAB — COMPREHENSIVE METABOLIC PANEL
ALT: 39 U/L (ref 0–55)
ANION GAP: 11 meq/L (ref 3–11)
AST: 34 U/L (ref 5–34)
Albumin: 3.9 g/dL (ref 3.5–5.0)
Alkaline Phosphatase: 50 U/L (ref 40–150)
BILIRUBIN TOTAL: 0.27 mg/dL (ref 0.20–1.20)
BUN: 11.6 mg/dL (ref 7.0–26.0)
CALCIUM: 8.9 mg/dL (ref 8.4–10.4)
CHLORIDE: 103 meq/L (ref 98–109)
CO2: 20 meq/L — AB (ref 22–29)
Creatinine: 1.1 mg/dL (ref 0.7–1.3)
EGFR: 82 mL/min/{1.73_m2} — AB (ref >90)
Glucose: 117 mg/dl (ref 70–140)
Potassium: 3.9 mEq/L (ref 3.5–5.1)
Sodium: 134 mEq/L — ABNORMAL LOW (ref 136–145)
Total Protein: 7.4 g/dL (ref 6.4–8.3)

## 2016-10-21 LAB — FERRITIN: Ferritin: 523 ng/ml — ABNORMAL HIGH (ref 22–316)

## 2016-10-21 NOTE — Progress Notes (Signed)
0900-0910- Performed therapeutic phlebotomy per Dr. Kale's parameters. This is only 2nd/4 phlebotomy treatment. Pt does state of feeling improvement with symptoms. Used 16g phlebotomy kit on LAC for 10 minutes and was able to drain 100cc of blood. Unable to continue on LAC.   0910-0915- Pt had another successful phlebotomy attempt on RAC over 5 minutes. Drained a 425cc of blood output. Pt was asymptomatic. Provided pt with hydration during post observation of 30 minutes. VSS upon discharge.  

## 2016-10-21 NOTE — Patient Instructions (Signed)

## 2016-10-22 ENCOUNTER — Ambulatory Visit: Payer: 59

## 2016-10-24 ENCOUNTER — Telehealth: Payer: Self-pay | Admitting: Neurology

## 2016-10-24 ENCOUNTER — Encounter: Payer: Self-pay | Admitting: Neurology

## 2016-10-24 ENCOUNTER — Ambulatory Visit (INDEPENDENT_AMBULATORY_CARE_PROVIDER_SITE_OTHER): Payer: 59 | Admitting: Psychology

## 2016-10-24 ENCOUNTER — Encounter: Payer: Self-pay | Admitting: Psychology

## 2016-10-24 DIAGNOSIS — IMO0002 Reserved for concepts with insufficient information to code with codable children: Secondary | ICD-10-CM

## 2016-10-24 DIAGNOSIS — R413 Other amnesia: Secondary | ICD-10-CM

## 2016-10-24 DIAGNOSIS — F1099 Alcohol use, unspecified with unspecified alcohol-induced disorder: Secondary | ICD-10-CM

## 2016-10-24 DIAGNOSIS — F339 Major depressive disorder, recurrent, unspecified: Secondary | ICD-10-CM
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
## 2016-10-24 NOTE — Telephone Encounter (Signed)
Pt called to advise he saw Dr [facility]/[place]-Neuropsychologist today and he was advised he should take a leave of absence due to the memory issues he is having. 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

## 2016-10-24 NOTE — Telephone Encounter (Signed)
I will write a letter regarding this patient, asking him to refrain from working until November 15. MRI the brain and neuropsychological evaluation are pending.

## 2016-10-25 NOTE — Telephone Encounter (Signed)
Letter printed, signed, up front for pick-up.

## 2016-10-26 ENCOUNTER — Ambulatory Visit (INDEPENDENT_AMBULATORY_CARE_PROVIDER_SITE_OTHER): Payer: 59

## 2016-10-26 DIAGNOSIS — R413 Other amnesia: Secondary | ICD-10-CM | POA: Diagnosis not present

## 2016-10-27 ENCOUNTER — Ambulatory Visit (INDEPENDENT_AMBULATORY_CARE_PROVIDER_SITE_OTHER): Payer: 59 | Admitting: Psychology

## 2016-10-27 DIAGNOSIS — R413 Other amnesia: Secondary | ICD-10-CM

## 2016-10-27 MED ORDER — GADOPENTETATE DIMEGLUMINE 469.01 MG/ML IV SOLN
20.0000 mL | Freq: Once | INTRAVENOUS | Status: DC | PRN
Start: 2016-10-27 — End: 2017-05-10

## 2016-10-27 NOTE — Progress Notes (Signed)
   Neuropsychology Note  Blake Roberts returned today for 3 hours of neuropsychological testing with technician, Milana Kidney, BS, under the supervision of Dr. Macarthur Critchley. The patient did not appear overtly distressed by the testing session, per behavioral observation or via self-report to the technician. Rest breaks were offered. Blake Roberts will return within 2 weeks for a feedback session with Dr. Si Raider at which time his test performances, clinical impressions and treatment recommendations will be reviewed in detail. The patient understands he can contact our office should he require our assistance before this time.  Full report to follow.

## 2016-10-28 ENCOUNTER — Telehealth: Payer: Self-pay | Admitting: Neurology

## 2016-10-28 NOTE — Telephone Encounter (Signed)
I called patient. MRI the brain shows iron deposition mainly in the cerebellar area. The patient does not have a definite diagnosis of hemochromatosis, the genetic testing was negative. The patient is running high ferritin levels, he donates blood every 2 weeks to get the levels down.  The patient does give a history of multiple concussions, episodes of loss of consciousness with head trauma associated with playing football or with boxing.  Formal neuropsychological testing is pending.   MRI brain 10/27/16:  IMPRESSION:  Abnormal MRI brain (with and without) demonstrating: 1. 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 This can be seen in association with patient's known diagnosis of hemochromatosis. Other etiologies of chronic subarachnoid hemorrhage may be considered as well, such as vascular malformations (aneurysm or AVM), spinal dural defects or cerebral amyloid angiopathy.  2. No acute findings.

## 2016-11-03 ENCOUNTER — Telehealth: Payer: Self-pay | Admitting: Hematology

## 2016-11-03 NOTE — Telephone Encounter (Signed)
11/10 Appointments canceled per patient request. Patient has to check with work schedule, plans to call back to reschedule.

## 2016-11-04 ENCOUNTER — Other Ambulatory Visit: Payer: 59

## 2016-11-15 ENCOUNTER — Encounter: Payer: 59 | Admitting: Psychology

## 2016-11-16 MED FILL — BUPROPION HCL XL 300 MG TAB: 300 | 30 days supply | Qty: 30 | Fill #3

## 2016-11-16 MED FILL — ROSUVASTATIN CALCIUM 20 MG: 20 | 90 days supply | Qty: 90 | Fill #2

## 2016-11-16 MED FILL — AMLODIPINE BESYLATE 10 MG T: 10 | 90 days supply | Qty: 90 | Fill #1

## 2016-11-18 ENCOUNTER — Other Ambulatory Visit: Payer: 59

## 2016-11-23 NOTE — Progress Notes (Signed)
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 He should be reminded that alcohol use and certainly binge drinking while on Wellbutrin poses a risk of seizures. 2. Strategies to enhance executive functioning were reviewed with the patient, and written information on these strategies was provided.  3. Neuropsychological re-evaluation: If the patient continues to report cognitive difficulties after sustained mental health treatment and sobriety, he may be referred for re-evaluation to further assist with differential diagnosis and treatment planning.   Feedback to Patient: Blake Roberts and his wife returned for a feedback appointment on 11/24/2016 to review the results of his neuropsychological evaluation with this provider. 30 minutes face-to-face time was spent reviewing his test results, my impressions and my recommendations as detailed above.    Total time spent on this patient's case: [id number] unit for interview with psychologist; [phone] units of testing by psychometrician under psychologist's supervision; [phone] units for medical record review, scoring of neuropsychological tests, interpretation of test results, preparation of this report, and review of results to the patient by psychologist.      Thank you for your referral of Blake Roberts. Please feel free to contact me if  you have any questions or concerns regarding this report.

## 2016-11-24 ENCOUNTER — Encounter: Payer: Self-pay | Admitting: Psychology

## 2016-11-24 ENCOUNTER — Telehealth: Payer: Self-pay | Admitting: Neurology

## 2016-11-24 ENCOUNTER — Ambulatory Visit (INDEPENDENT_AMBULATORY_CARE_PROVIDER_SITE_OTHER): Payer: 59 | Admitting: Psychology

## 2016-11-24 DIAGNOSIS — F1099 Alcohol use, unspecified with unspecified alcohol-induced disorder: Secondary | ICD-10-CM

## 2016-11-24 DIAGNOSIS — F339 Major depressive disorder, recurrent, unspecified: Secondary | ICD-10-CM

## 2016-11-24 DIAGNOSIS — R413 Other amnesia: Secondary | ICD-10-CM | POA: Diagnosis not present

## 2016-11-24 DIAGNOSIS — IMO0002 Reserved for concepts with insufficient information to code with codable children: Secondary | ICD-10-CM

## 2016-11-24 NOTE — Telephone Encounter (Signed)
Neuropsychological evaluation shows no definite dementia, the patient may have some executive function issues. Follow-up through psychiatry was recommended.   Neuropsychological evaluation 11/24/16:  Clinical Impressions: Rule out mild cognitive impairment (executive dysfunction). Major depressive disorder, severe. Alcohol use disorder.  Interpretation of neurocognitive testing results is complicated by the patient's poor performance on a stand-alone measure of memory malingering. Nonetheless, he did perform well across most measures of cognitive function, and I am not concerned about dementia at this time. There is the possibility of executive dysfunction, and if this is present, etiology remains unclear. I doubt it has to do with his remote history of concussions. He does have hemochromatosis, but there is limited research showing any association among this and cognitive dysfunction. Superficial siderosis has been related to cognitive decline in a few cases in the literature. Hepatic functioning is reportedly normal, ruling out hepatic encephalopathy.  The patient has significant indicators of emotional distress and he meets criteria for major depressive disorder, current episode severe. His depression is likely contributing to his reported functional decline. He also is abusing alcohol at the present time, and has a history of recurrent alcohol abuse. More aggressive treatment of depression and alcohol abuse is indicated.   Recommendations/Plan: Based on the findings of the present evaluation, the following recommendations are offered:  1. Mental health treatment: The patient is followed by a psychiatrist for medication management. He also reported reduced stress along with improved functioning since starting a new job that is less demanding. I recommended that the patient consider individual psychotherapy as well, and he is amenable to this. He was encouraged to reduce alcohol intake; he agrees  that he was self medicating. He should be reminded that alcohol use and certainly binge drinking while on Wellbutrin poses a risk of seizures. 2. Strategies to enhance executive functioning were reviewed with the patient, and written information on these strategies was provided.  3. Neuropsychological re-evaluation: If the patient continues to report cognitive difficulties after sustained mental health treatment and sobriety, he may be referred for re-evaluation to further assist with differential diagnosis and treatment planning.

## 2016-11-27 ENCOUNTER — Telehealth: Payer: Self-pay | Admitting: Hematology

## 2016-11-27 NOTE — Telephone Encounter (Signed)
S/w pt, advised 12/8 appt moved due to md CME. Pt says due to work he can only come in on Wed or Thurs. Gave pt appt for 12/7 @ 2.30pm.

## 2016-12-01 ENCOUNTER — Other Ambulatory Visit: Payer: 59

## 2016-12-01 ENCOUNTER — Ambulatory Visit: Payer: 59 | Admitting: Hematology

## 2016-12-01 NOTE — Progress Notes (Signed)
[facility]    HEMATOLOGY/ONCOLOGY CLINIC NOTE  Date of Service: .10/07/2016  Patient Care Team: Wendie Agreste, MD as PCP - General (Family Medicine) Robyn Haber, MD as Consulting Physician (Family Medicine)  CHIEF COMPLAINTS/PURPOSE OF CONSULTATION:  Elevated ferritin and fatigue  HISTORY OF PRESENTING ILLNESS:   Blake Roberts is a wonderful 50 y.o. male who has been referred to [place] by Dr Wendie Agreste, MD /Dr Adrian Prows for evaluation and management of elevated ferritin.  Patient has a history of hypertension, dyslipidemia, anxiety, arthritis, depression who has been following with Dr.Ganji at [facility] for evaluation of dizziness and near-syncope as well as nausea vomiting and epigastric pain over the last 6 months. He has also apparently had significant dyspnea on exertion and was concerned since his brother had a pacemaker placed at age 75 years. He was noted to have uncontrolled hypertension and in addition to amlodipine that he was on was also started on valsartan/hydrochlorothiazide. He also had an echo and an event monitor evaluation which did not show any cardiomyopathy or arrhythmias. He subsequently had a treadmill stress test on 02/17/2016 that showed normal exercise tolerance and no evidence of overt ischemia. No arrhythmias.  During the course of his workup for fatigue and dyspnea on exertion he was noted to have an elevated ferritin level of 769 on 12/30/2015. This has remained elevated in the 500-700 range and he was referred to [place] for evaluation and management of possible hemochromatosis.  Patient notes family history of unexplained heart disease in his brother at age 8. No family history of liver cirrhosis no known family history of iron overload or hemochromatosis.  He notes the fatigue has been more bothersome since early part of this year and he is had some joint pains as well. Denies excessive alcohol use. Denies taking iron supplementation  over-the-counter or using cast iron skin metastases for cooking.  INTERVAL HISTORY  Blake Roberts is here for follow-up to determine tolerance of therapeutic phlebotomies. He notes that he did not really start therapeutic phlebotomies yet and will be starting today. No other acute new symptoms.  MEDICAL HISTORY:  Past Medical History:  Diagnosis Date  . Acute meniscal tear of knee RIGHT KNEE  . Anxiety   . Arthritis RIGHT KNEE  . Bulging lumbar disc L5 - S1  . Depression   . Hemochromatosis   . Hyperlipidemia   . Hypertension     SURGICAL HISTORY: Past Surgical History:  Procedure Laterality Date  . KNEE ARTHROSCOPY  AGE 61 (APPROX)   RIGHT KNEE  . KNEE ARTHROSCOPY  07/13/2012   Procedure: ARTHROSCOPY KNEE;  Surgeon: Magnus Sinning, MD;  Location: [facility];  Service: Orthopedics;  Laterality: Right;  WITH PARTIAL MEDIAL MENISECTOMY AND REMOVAL OF LOOSE BODIES  . torn rotator cuff      SOCIAL HISTORY: Social History   Social History  . Marital status: Legally Separated    Spouse name: N/A  . Number of children: N/A  . Years of education: BA   Occupational History  . [employer]    Social History Main Topics  . Smoking status: Never Smoker  . Smokeless tobacco: Never Used  . Alcohol use Yes     Comment: currently drinking 4 beers a day (has a history of recurrent binge drinking)  . Drug use: No  . Sexual activity: Yes   Other Topics Concern  . Not on file   Social History Narrative   Lives at home alone  Separated, 2 step-daughters   Right-handed   Caffeine: 2 diet Cokes per day    FAMILY HISTORY: Family History  Problem Relation Age of Onset  . Cancer Mother   . Diabetes Father   . Heart disease Father   . Hyperlipidemia Father   . Hypertension Father   . Kidney disease Father   . Colon cancer Neg Hx     Maternal grandmother had breast cancer at age 29 years Mother had lung cancer she was a smoker Maternal grandfather lung  cancer Maternal aunt throat cancer Reports that he does not know much regarding his father's side of the family.   ALLERGIES:  is allergic to ace inhibitors.  MEDICATIONS:  Current Outpatient Prescriptions  Medication Sig Dispense Refill  . amLODipine (NORVASC) 10 MG tablet Take 10 mg by mouth daily.    Marland Kitchen buPROPion (WELLBUTRIN XL) 300 MG 24 hr tablet Take 300 mg by mouth daily.    . ondansetron (ZOFRAN) 8 MG tablet Take 1 tablet (8 mg total) by mouth every 8 (eight) hours as needed for nausea or vomiting. 60 tablet 1  . rosuvastatin (CRESTOR) 20 MG tablet Take 1 tablet (20 mg total) by mouth daily. 30 tablet 6  . tadalafil (CIALIS) 10 MG tablet Take 0.5 tablets (5 mg total) by mouth daily as needed for erectile dysfunction. 10 tablet 9  . valsartan (DIOVAN) 160 MG tablet Take 1 tablet (160 mg total) by mouth daily. 30 tablet 1  . cyclobenzaprine (FLEXERIL) 5 MG tablet 1 pill by mouth up to every 8 hours as needed. Start with one pill by mouth each bedtime as needed due to sedation 15 tablet 0  . HYDROcodone-acetaminophen (NORCO/VICODIN) 5-325 MG tablet Take 1 tablet by mouth every 6 (six) hours as needed for moderate pain. 15 tablet 0  . predniSONE (DELTASONE) 20 MG tablet 3 by mouth for 3 days, then 2 by mouth for 2 days, then 1 by mouth for 2 days, then 1/2 by mouth for 2 days. 16 tablet 0   No current facility-administered medications for this visit.    Facility-Administered Medications Ordered in Other Visits  Medication Dose Route Frequency Provider Last Rate Last Dose  . gadopentetate dimeglumine (MAGNEVIST) injection 20 mL  20 mL Intravenous Once PRN Kathrynn Ducking, MD        REVIEW OF SYSTEMS:    10 Point review of Systems was done is negative except as noted above.  PHYSICAL EXAMINATION: ECOG PERFORMANCE STATUS: 1 - Symptomatic but completely ambulatory  . Vitals:   10/07/16 0915  BP: 129/86  Pulse: 68  Resp: 18  Temp: 97.8 F (36.6 C)   Filed Weights   10/07/16  0915  Weight: 226 lb 8 oz (102.7 kg)   .Body mass index is 31.59 kg/m.  GENERAL:alert, in no acute distress and comfortable SKIN: skin color, texture, turgor are normal, no rashes or significant lesions EYES: normal, conjunctiva are pink and non-injected, sclera clear OROPHARYNX:no exudate, no erythema and lips, buccal mucosa, and tongue normal  NECK: supple, no JVD, thyroid normal size, non-tender, without nodularity LYMPH:  no palpable lymphadenopathy in the cervical, axillary or inguinal LUNGS: clear to auscultation with normal respiratory effort HEART: regular rate & rhythm,  no murmurs and no lower extremity edema ABDOMEN: abdomen soft, non-tender, normoactive bowel sounds  Musculoskeletal: no cyanosis of digits and no clubbing  PSYCH: alert & oriented x 3 with fluent speech NEURO: no focal motor/sensory deficits  LABORATORY DATA:  I have reviewed the  data as listed Component     Latest Ref Rng & Units 10/07/2016  WBC     4.0 - 10.3 10e3/uL 7.0  NEUT#     1.5 - 6.5 10e3/uL 4.3  Hemoglobin     13.0 - 17.1 g/dL 13.2  HCT     38.4 - 49.9 % 38.6  Platelets     140 - 400 10e3/uL 253  MCV     79.3 - 98.0 fL 93.2  MCH     27.2 - 33.4 pg 31.9  MCHC     32.0 - 36.0 g/dL 34.2  RBC     4.20 - 5.82 10e6/uL 4.14 (L)  RDW     11.0 - 14.6 % 13.0  lymph#     0.9 - 3.3 10e3/uL 1.9  MONO#     0.1 - 0.9 10e3/uL 0.6  Eosinophils Absolute     0.0 - 0.5 10e3/uL 0.1  Basophils Absolute     0.0 - 0.1 10e3/uL 0.0  NEUT%     39.0 - 75.0 % 62.0  LYMPH%     14.0 - 49.0 % 27.3  MONO%     0.0 - 14.0 % 9.1  EOS%     0.0 - 7.0 % 1.3  BASO%     0.0 - 2.0 % 0.3  Retic %     0.80 - 1.80 % 1.47  Retic Ct Abs     34.80 - 93.90 10e3/uL 60.86  Immature Retic Fract     3.00 - 10.60 % 8.30  Sodium     136 - 145 mEq/L 139  Potassium     3.5 - 5.1 mEq/L 4.2  Chloride     98 - 109 mEq/L 104  CO2     22 - 29 mEq/L 23  Glucose     70 - 140 mg/dl 108  BUN     7.0 - 26.0 mg/dL 20.2   Creatinine     0.7 - 1.3 mg/dL 1.2  Total Bilirubin     0.20 - 1.20 mg/dL 0.38  Alkaline Phosphatase     40 - 150 U/L 53  AST     5 - 34 U/L 25  ALT     0 - 55 U/L 34  Total Protein     6.4 - 8.3 g/dL 7.4  Albumin     3.5 - 5.0 g/dL 4.0  Calcium     8.4 - 10.4 mg/dL 9.5  Anion gap     3 - 11 mEq/L 12 (H)  EGFR     >90 ml/min/1.73 m2 71 (L)  Ferritin     22 - 316 ng/ml 497 (H)    Component     Latest Ref Rng & Units 08/01/2016  Iron     42 - 163 ug/dL 116  TIBC     202 - 409 ug/dL 343  UIBC     117 - 376 ug/dL 226  %SAT     20 - 55 % 34  Ferritin     22 - 316 ng/ml 628 (H)  Transferrin     200 - 370 mg/dL 280   Hemochromatosis DNA, PCR  Order: [id number]  Status:  Final result Visible to patient:  No (Not Released) Next appt:  09/26/2016 at 09:30 AM in Neurology Lenor Coffin, MD) Dx:  Elevated ferritin   84moago  Hemochromatosis Gene Comment   Comments: NO MUTATION IDENTIFIED  Interpretation:  This patient's sample was analyzed for the hereditary  hemochromatosis (HH) mutations C282Y, H63D, S65C. No  mutation was identified. The mutations analyzed by [facility]  are most common in the Caucasian population, and up to 90%  of affected Caucasians will have a positive test result.  Because this panel does not identify rare HH mutations or  HH mutations found in other ethnic groups, there are a  small number of people who may have a negative test but may  actually be affected. The diagnosis of HH should include  clinical findings and other test results such as  transferrin-iron saturation and/or serum ferritin studies  and/or liver biopsy. If this patient has a history of HH,  in many cases a specific carrier risk can be determined  based on this negative result.  Methodology:  DNA Analysis of the HFE gene was performed by PCR  amplification followed by restriction enzyme digestion  analyses.         RADIOGRAPHIC STUDIES: I have personally  reviewed the radiological images as listed and agreed with the findings in the report. No results found.  ASSESSMENT & PLAN:   50 year old Caucasian male with  1) Elevated Ferritin levels. Patient reports some unexplained fatigue and joint pains. His brother needed a pacemaker at age 44yrno clear etiology. He has had some episodes of dizziness that have been worked up by his cardiologist Dr. GEinar Gipand and he has had a negative cardiac workup thus far . No known history of liver disease or elevated liver function tests at this time . No family history of liver cirrhosis , overt h/o sudden cardiac death or hemochromatosis . HFE genetic panel negative. PLAN -He postponed his therapeutic phlebotomies and hasn't really started these yet. He notes that he does want to proceed with these. - We discussed that at this point since he still has symptoms of unexplained fatigue that one could do a trial of therapeutic phlebotomies to determine how resistant his ferritin levels are to therapeutic phlebotomies since that would be a diagnostic and a therapeutic challenge.   Therapeutic phlebotomy q2weeks x 4 (including today) Labs q2weeks prior to phlebotomy RTC with Dr KIrene Limboin 2 months with labs  All of the patients questions were answered with apparent satisfaction. The patient knows to call the clinic with any problems, questions or concerns.   GSullivan LoneMD MJacksonvilleAAHIVMS SDigestive Diseases Center Of Hatties[facility] LLCCFulton County Health CenterHematology/Oncology Physician CKettering [facility] (Office):       [phone](Work cell):  [phone](Fax):           [phone]

## 2016-12-02 ENCOUNTER — Other Ambulatory Visit: Payer: 59

## 2016-12-02 ENCOUNTER — Ambulatory Visit: Payer: 59 | Admitting: Hematology

## 2017-02-17 ENCOUNTER — Telehealth: Payer: Self-pay | Admitting: Hematology

## 2017-02-17 NOTE — Telephone Encounter (Signed)
Faxed office notes to [facility] [phone]

## 2017-03-27 ENCOUNTER — Ambulatory Visit: Payer: 59 | Admitting: Neurology

## 2017-03-30 ENCOUNTER — Telehealth: Payer: Self-pay | Admitting: Family Medicine

## 2017-03-30 MED ORDER — AMLODIPINE BESYLATE 10 MG PO TABS: 10.0000 mg | ORAL_TABLET | Freq: Every day | ORAL | 0 refills | Status: DC

## 2017-03-30 NOTE — Telephone Encounter (Signed)
Pt calling needing a 1 month refill on his Norvasc until his insurance start on May 1st he has started a new job and insurance want be good until the 1st [facility]please [facility] [address]

## 2017-03-31 MED FILL — AMLODIPINE BESYLATE 10 MG T: 10 | 30 days supply | Qty: 30 | Fill #0

## 2017-05-09 ENCOUNTER — Emergency Department (HOSPITAL_BASED_OUTPATIENT_CLINIC_OR_DEPARTMENT_OTHER): Payer: 59

## 2017-05-09 ENCOUNTER — Other Ambulatory Visit: Payer: Self-pay

## 2017-05-09 ENCOUNTER — Encounter (HOSPITAL_BASED_OUTPATIENT_CLINIC_OR_DEPARTMENT_OTHER): Payer: Self-pay | Admitting: Emergency Medicine

## 2017-05-09 ENCOUNTER — Emergency Department (HOSPITAL_BASED_OUTPATIENT_CLINIC_OR_DEPARTMENT_OTHER)
Admission: EM | Admit: 2017-05-09 | Discharge: 2017-05-09 | Disposition: A | Discharge status: Home / Self Care | Payer: 59 | Attending: Emergency Medicine | Admitting: Emergency Medicine

## 2017-05-09 DIAGNOSIS — I10 Essential (primary) hypertension: Secondary | ICD-10-CM | POA: Diagnosis not present

## 2017-05-09 DIAGNOSIS — Z0271 Encounter for disability determination: Secondary | ICD-10-CM

## 2017-05-09 DIAGNOSIS — Z79899 Other long term (current) drug therapy: Secondary | ICD-10-CM | POA: Insufficient documentation

## 2017-05-09 DIAGNOSIS — R51 Headache: Secondary | ICD-10-CM

## 2017-05-09 DIAGNOSIS — R519 Headache, unspecified: Secondary | ICD-10-CM

## 2017-05-09 LAB — URINALYSIS, ROUTINE W REFLEX MICROSCOPIC
Bilirubin Urine: NEGATIVE
GLUCOSE, UA: NEGATIVE mg/dL
HGB URINE DIPSTICK: NEGATIVE
Ketones, ur: NEGATIVE mg/dL
Leukocytes, UA: NEGATIVE
Nitrite: NEGATIVE
Protein, ur: NEGATIVE mg/dL
SPECIFIC GRAVITY, URINE: 1.01 (ref 1.005–1.030)
pH: 6.5 (ref 5.0–8.0)

## 2017-05-09 LAB — COMPREHENSIVE METABOLIC PANEL
ALBUMIN: 4.4 g/dL (ref 3.5–5.0)
ALT: 24 U/L (ref 17–63)
AST: 33 U/L (ref 15–41)
Alkaline Phosphatase: 49 U/L (ref 38–126)
Anion gap: 12 (ref 5–15)
BUN: 16 mg/dL (ref 6–20)
CHLORIDE: 100 mmol/L — AB (ref 101–111)
CO2: 24 mmol/L (ref 22–32)
Calcium: 9 mg/dL (ref 8.9–10.3)
Creatinine, Ser: 1.21 mg/dL (ref 0.61–1.24)
GFR calc Af Amer: >60 mL/min (ref >60)
GFR calc non Af Amer: >60 mL/min (ref >60)
GLUCOSE: 122 mg/dL — AB (ref 65–99)
POTASSIUM: 3.6 mmol/L (ref 3.5–5.1)
Sodium: 136 mmol/L (ref 135–145)
Total Bilirubin: 0.3 mg/dL (ref 0.3–1.2)
Total Protein: 7.6 g/dL (ref 6.5–8.1)

## 2017-05-09 LAB — CBC
HEMATOCRIT: 39.7 % (ref 39.0–52.0)
Hemoglobin: 13.9 g/dL (ref 13.0–17.0)
MCH: 32.5 pg (ref 26.0–34.0)
MCHC: 35 g/dL (ref 30.0–36.0)
MCV: 92.8 fL (ref 78.0–100.0)
PLATELETS: 269 10*3/uL (ref 150–400)
RBC: 4.28 MIL/uL (ref 4.22–5.81)
RDW: 12.7 % (ref 11.5–15.5)
WBC: 7 10*3/uL (ref 4.0–10.5)

## 2017-05-09 LAB — TROPONIN I

## 2017-05-09 MED ORDER — VALSARTAN 160 MG PO TABS: 160.0000 mg | ORAL_TABLET | Freq: Every day | ORAL | 0 refills | Status: DC

## 2017-05-09 MED ORDER — SODIUM CHLORIDE 0.9 % IV SOLN
INTRAVENOUS | Status: DC
  Administered 2017-05-09: 15:00:00 via INTRAVENOUS

## 2017-05-09 MED ORDER — HYDROMORPHONE HCL 1 MG/ML IJ SOLN
0.5000 mg | Freq: Once | INTRAMUSCULAR | Status: AC
  Administered 2017-05-09: 0.5 mg via INTRAVENOUS
  Filled 2017-05-09: qty 1

## 2017-05-09 MED ORDER — IRBESARTAN 150 MG PO TABS
150.0000 mg | ORAL_TABLET | Freq: Every day | ORAL | Status: DC
  Filled 2017-05-09: qty 1

## 2017-05-09 MED ORDER — ONDANSETRON HCL 4 MG/2ML IJ SOLN
4.0000 mg | Freq: Once | INTRAMUSCULAR | Status: AC
  Administered 2017-05-09: 4 mg via INTRAVENOUS
  Filled 2017-05-09: qty 2

## 2017-05-09 MED ORDER — LORAZEPAM 2 MG/ML IJ SOLN
1.0000 mg | Freq: Once | INTRAMUSCULAR | Status: AC
  Administered 2017-05-09: 1 mg via INTRAVENOUS
  Filled 2017-05-09: qty 1

## 2017-05-09 MED FILL — VALSARTAN 160 MG TABLET: 160 | 30 days supply | Qty: 30 | Fill #0

## 2017-05-09 NOTE — ED Notes (Addendum)
MD notified Avapro not available.

## 2017-05-09 NOTE — ED Triage Notes (Signed)
Patient reports history of hypertension. States that he recently ran out of Diovan-last dose 2 days ago.  States he began having a severe headache at 1200 today with nausea, left sided shoulder pain, multiple episodes of vomiting, dizziness.  Patient alert and oriented and present.

## 2017-05-09 NOTE — ED Provider Notes (Addendum)
[facility] Provider Note   CSN: [id number] Arrival date & time: 05/09/17  1349     History   Chief Complaint Chief Complaint  Patient presents with  . Hypertension    HPI Blake Roberts is a 51 y.o. male.  Patient states concerned about high blood pressure, had run out of meds 2-3 days ago. States also onset frontal headache today. Was gradual in onset, milder at onset, but slowly worse and now severe. Denies hx migraines or chronic headaches. No eye pain or change in vision. No neck pain or stiffness. No sinus drainage or pressure. Denies cough or uri c/o. No fever or chills. No associated numbness/weakness or change in normal functional ability. Pt with nausea/dry heaves. No vomiting. No diarrhea. No abd pain. Appears anxious.    The history is provided by the patient.  Hypertension  Associated symptoms include headaches. Pertinent negatives include no chest pain, no abdominal pain and no shortness of breath.    Past Medical History:  Diagnosis Date  . Acute meniscal tear of knee RIGHT KNEE  . Anxiety   . Arthritis RIGHT KNEE  . Bulging lumbar disc L5 - S1  . Depression   . Hemochromatosis   . Hyperlipidemia   . Hypertension     Patient Active Problem List   Diagnosis Date Noted  . Memory difficulty 09/26/2016  . Hemochromatosis 07/11/2016  . Elevated blood pressure 05/24/2016  . Episodic lightheadedness 12/25/2015  . Belly pain 12/25/2015  . Anemia, unspecified 12/25/2015    Past Surgical History:  Procedure Laterality Date  . KNEE ARTHROSCOPY  AGE 67 (APPROX)   RIGHT KNEE  . KNEE ARTHROSCOPY  07/13/2012   Procedure: ARTHROSCOPY KNEE;  Surgeon: Magnus Sinning, MD;  Location: [facility];  Service: Orthopedics;  Laterality: Right;  WITH PARTIAL MEDIAL MENISECTOMY AND REMOVAL OF LOOSE BODIES  . torn rotator cuff         Home Medications    Prior to Admission medications   Medication Sig Start Date End Date Taking?  Authorizing Provider  amLODipine (NORVASC) 10 MG tablet Take 1 tablet (10 mg total) by mouth daily. 03/30/17   Wendie Agreste, MD  buPROPion (WELLBUTRIN XL) 300 MG 24 hr tablet Take 300 mg by mouth daily.    [provider]  cyclobenzaprine (FLEXERIL) 5 MG tablet 1 pill by mouth up to every 8 hours as needed. Start with one pill by mouth each bedtime as needed due to sedation 10/17/16   Wendie Agreste, MD  HYDROcodone-acetaminophen (NORCO/VICODIN) 5-325 MG tablet Take 1 tablet by mouth every 6 (six) hours as needed for moderate pain. 10/17/16   Wendie Agreste, MD  ondansetron (ZOFRAN) 8 MG tablet Take 1 tablet (8 mg total) by mouth every 8 (eight) hours as needed for nausea or vomiting. 01/07/16   Hvozdovic, Cecille Rubin P, PA-C  predniSONE (DELTASONE) 20 MG tablet 3 by mouth for 3 days, then 2 by mouth for 2 days, then 1 by mouth for 2 days, then 1/2 by mouth for 2 days. 10/17/16   Wendie Agreste, MD  rosuvastatin (CRESTOR) 20 MG tablet Take 1 tablet (20 mg total) by mouth daily. 09/03/16   Robyn Haber, MD  tadalafil (CIALIS) 10 MG tablet Take 0.5 tablets (5 mg total) by mouth daily as needed for erectile dysfunction. 11/28/15   Copland, Gay Filler, MD  valsartan (DIOVAN) 160 MG tablet Take 1 tablet (160 mg total) by mouth daily. 10/05/16   Molli Barrows  S, FNP    Family History Family History  Problem Relation Age of Onset  . Cancer Mother   . Diabetes Father   . Heart disease Father   . Hyperlipidemia Father   . Hypertension Father   . Kidney disease Father   . Colon cancer Neg Hx     Social History Social History  Substance Use Topics  . Smoking status: Never Smoker  . Smokeless tobacco: Never Used  . Alcohol use Yes     Comment: 4 beers a month     Allergies   Ace inhibitors   Review of Systems Review of Systems  Constitutional: Negative for fever.  HENT: Negative for sinus pressure and sore throat.   Eyes: Negative for pain, redness and visual  disturbance.  Respiratory: Negative for cough and shortness of breath.   Cardiovascular: Negative for chest pain.  Gastrointestinal: Positive for nausea. Negative for abdominal pain and vomiting.  Genitourinary: Negative for flank pain and hematuria.  Musculoskeletal: Negative for back pain, neck pain and neck stiffness.  Skin: Negative for rash.  Neurological: Positive for headaches. Negative for speech difficulty, weakness and numbness.  Hematological: Does not bruise/bleed easily.  Psychiatric/Behavioral: Negative for confusion.     Physical Exam Updated Vital Signs BP (!) 169/92   Pulse 79   Temp 100.1 F (37.8 C) (Rectal)   Resp (!) 23   Ht 5\' 11"  (1.803 m)   Wt 102.1 kg   SpO2 98%   BMI 31.38 kg/m   Physical Exam  Constitutional: He is oriented to person, place, and time. He appears well-developed and well-nourished. No distress.  HENT:  Head: Atraumatic.  Mouth/Throat: Oropharynx is clear and moist.  No sinus or temporal tenderness  Eyes: Conjunctivae and EOM are normal. Pupils are equal, round, and reactive to light.  Neck: Neck supple. No tracheal deviation present. No thyromegaly present.  No stiffness or rigidity  Cardiovascular: Normal rate, regular rhythm, normal heart sounds and intact distal pulses.  Exam reveals no gallop and no friction rub.   No murmur heard. Pulmonary/Chest: Effort normal and breath sounds normal. No accessory muscle usage. No respiratory distress.  Abdominal: Soft. Bowel sounds are normal. He exhibits no distension. There is no tenderness.  Musculoskeletal: He exhibits no edema.  Neurological: He is alert and oriented to person, place, and time. No cranial nerve deficit.  Speech clear/fluent. Motor intact bil. stre 5/5. sens grossly intact.   Skin: Skin is warm and dry. No rash noted. He is not diaphoretic.  Psychiatric:  Anxious appearing.   Nursing note and vitals reviewed.    ED Treatments / Results  Labs (all labs ordered are  listed, but only abnormal results are displayed) Results for orders placed or performed during the hospital encounter of 05/09/17  CBC  Result Value Ref Range   WBC 7.0 4.0 - 10.5 K/uL   RBC 4.28 4.22 - 5.81 MIL/uL   Hemoglobin 13.9 13.0 - 17.0 g/dL   HCT 39.7 39.0 - 52.0 %   MCV 92.8 78.0 - 100.0 fL   MCH 32.5 26.0 - 34.0 pg   MCHC 35.0 30.0 - 36.0 g/dL   RDW 12.7 11.5 - 15.5 %   Platelets 269 150 - 400 K/uL  Comprehensive metabolic panel  Result Value Ref Range   Sodium 136 135 - 145 mmol/L   Potassium 3.6 3.5 - 5.1 mmol/L   Chloride 100 (L) 101 - 111 mmol/L   CO2 24 22 - 32 mmol/L   Glucose,  Bld 122 (H) 65 - 99 mg/dL   BUN 16 6 - 20 mg/dL   Creatinine, Ser 1.21 0.61 - 1.24 mg/dL   Calcium 9.0 8.9 - 10.3 mg/dL   Total Protein 7.6 6.5 - 8.1 g/dL   Albumin 4.4 3.5 - 5.0 g/dL   AST 33 15 - 41 U/L   ALT 24 17 - 63 U/L   Alkaline Phosphatase 49 38 - 126 U/L   Total Bilirubin 0.3 0.3 - 1.2 mg/dL   GFR calc non Af Amer >60 >60 mL/min   GFR calc Af Amer >60 >60 mL/min   Anion gap 12 5 - 15  Urinalysis, Routine w reflex microscopic  Result Value Ref Range   Color, Urine YELLOW YELLOW   APPearance CLEAR CLEAR   Specific Gravity, Urine 1.010 1.005 - 1.030   pH 6.5 5.0 - 8.0   Glucose, UA NEGATIVE NEGATIVE mg/dL   Hgb urine dipstick NEGATIVE NEGATIVE   Bilirubin Urine NEGATIVE NEGATIVE   Ketones, ur NEGATIVE NEGATIVE mg/dL   Protein, ur NEGATIVE NEGATIVE mg/dL   Nitrite NEGATIVE NEGATIVE   Leukocytes, UA NEGATIVE NEGATIVE  Troponin I  Result Value Ref Range   Troponin I <0.03 <0.03 ng/mL   Ct Head Wo Contrast  Result Date: 05/09/2017 CLINICAL DATA:  Headache and dizziness EXAM: CT HEAD WITHOUT CONTRAST TECHNIQUE: Contiguous axial images were obtained from the base of the skull through the vertex without intravenous contrast. COMPARISON:  10/26/2016 FINDINGS: Brain: No evidence of acute infarction, hemorrhage, hydrocephalus, extra-axial collection or mass lesion/mass effect.  Vascular: No hyperdense vessel or unexpected calcification. Skull: Normal. Negative for fracture or focal lesion. Sinuses/Orbits: No acute finding. Other: None. IMPRESSION: Normal head CT. Electronically Signed   By: Kerby Moors M.D.   On: 05/09/2017 14:54    EKG  EKG Interpretation None       Radiology Dg Chest 2 View  Result Date: 05/09/2017 CLINICAL DATA:  Hypertension, headache and fever today. History of hypertension, hyperlipidemia. EXAM: CHEST  2 VIEW COMPARISON:  Chest radiograph March 30, 2016 FINDINGS: Cardiomediastinal silhouette is normal. No pleural effusions or focal consolidations. Trachea projects midline and there is no pneumothorax. Soft tissue planes and included osseous structures are non-suspicious. Mild degenerative change of the thoracic spine. IMPRESSION: Stable examination:  No acute cardiopulmonary process. Electronically Signed   By: Elon Alas M.D.   On: 05/09/2017 15:50   Ct Head Wo Contrast  Result Date: 05/09/2017 CLINICAL DATA:  Headache and dizziness EXAM: CT HEAD WITHOUT CONTRAST TECHNIQUE: Contiguous axial images were obtained from the base of the skull through the vertex without intravenous contrast. COMPARISON:  10/26/2016 FINDINGS: Brain: No evidence of acute infarction, hemorrhage, hydrocephalus, extra-axial collection or mass lesion/mass effect. Vascular: No hyperdense vessel or unexpected calcification. Skull: Normal. Negative for fracture or focal lesion. Sinuses/Orbits: No acute finding. Other: None. IMPRESSION: Normal head CT. Electronically Signed   By: Kerby Moors M.D.   On: 05/09/2017 14:54    Procedures Procedures (including critical care time)  Medications Ordered in ED Medications  0.9 %  sodium chloride infusion ( Intravenous New Bag/Given 05/09/17 1451)  HYDROmorphone (DILAUDID) injection 0.5 mg (not administered)  LORazepam (ATIVAN) injection 1 mg (not administered)  ondansetron (ZOFRAN) injection 4 mg (4 mg Intravenous Given  05/09/17 1449)     Initial Impression / Assessment and Plan / ED Course  I have reviewed the triage vital signs and the nursing notes.  Pertinent labs & imaging results that were available during my care of  the patient were reviewed by me and considered in my medical decision making (see chart for details).  Labs. Imaging study.   Dilaudid .5 mg iv for pain. zofran for nausea.  Pt anxious. Ativan 1 mg iv.   Reviewed nursing notes and prior charts for additional history.   Head ct neg.  Labs unremarkable. bp improved.  Pt feels much improved.  Pt currently appears stable for d/c.   Patient indicates he has his amlodipine, but is out of diovan.   Final Clinical Impressions(s) / ED Diagnoses   Final diagnoses:  None    New Prescriptions New Prescriptions   No medications on file         Lajean Saver, MD 05/09/17 [phone]

## 2017-05-09 NOTE — ED Notes (Signed)
Patient transported to CT 

## 2017-05-09 NOTE — ED Notes (Signed)
ED Provider at bedside. 

## 2017-05-09 NOTE — Discharge Instructions (Signed)
It was our pleasure to provide your ER care today - we hope that you feel better.  Rest. Drink plenty of fluids.  Take acetaminophen and/or ibuprofen as need.   Take your blood pressure medication as prescribed. Limit salt intake. Follow up with primary care doctor for recheck/recheck of blood pressure in the next 2-3 days.  Return to ER right away if worse, new symptoms, high fevers, worsening or severe head pain, persistent vomiting, other concern.

## 2017-05-10 ENCOUNTER — Encounter: Payer: Self-pay | Admitting: Physician Assistant

## 2017-05-10 ENCOUNTER — Ambulatory Visit (INDEPENDENT_AMBULATORY_CARE_PROVIDER_SITE_OTHER): Payer: 59 | Admitting: Physician Assistant

## 2017-05-10 VITALS — BP 166/108 | HR 78 | Temp 98.6°F | Resp 18 | Ht 70.5 in | Wt 228.0 lb

## 2017-05-10 DIAGNOSIS — I10 Essential (primary) hypertension: Secondary | ICD-10-CM

## 2017-05-10 DIAGNOSIS — N529 Male erectile dysfunction, unspecified: Secondary | ICD-10-CM | POA: Diagnosis not present

## 2017-05-10 DIAGNOSIS — E785 Hyperlipidemia, unspecified: Secondary | ICD-10-CM

## 2017-05-10 DIAGNOSIS — R739 Hyperglycemia, unspecified: Secondary | ICD-10-CM | POA: Diagnosis not present

## 2017-05-10 DIAGNOSIS — R110 Nausea: Secondary | ICD-10-CM

## 2017-05-10 LAB — GLUCOSE, POCT (MANUAL RESULT ENTRY): POC Glucose: 98 mg/dl (ref 70–99)

## 2017-05-10 MED ORDER — AMLODIPINE BESYLATE 10 MG PO TABS: 10.0000 mg | ORAL_TABLET | Freq: Every day | ORAL | 3 refills | Status: DC

## 2017-05-10 MED ORDER — VALSARTAN 160 MG PO TABS: 160.0000 mg | ORAL_TABLET | Freq: Every day | ORAL | 3 refills | Status: DC

## 2017-05-10 MED ORDER — ROSUVASTATIN CALCIUM 20 MG PO TABS: 20.0000 mg | ORAL_TABLET | Freq: Every day | ORAL | 3 refills | Status: DC

## 2017-05-10 MED ORDER — TADALAFIL 10 MG PO TABS: 5.0000 mg | ORAL_TABLET | Freq: Every day | ORAL | 5 refills | Status: DC | PRN

## 2017-05-10 MED ORDER — ONDANSETRON HCL 8 MG PO TABS: 8.0000 mg | ORAL_TABLET | Freq: Three times a day (TID) | ORAL | 0 refills | Status: DC | PRN

## 2017-05-10 NOTE — Progress Notes (Signed)
Patient ID: Blake Roberts, male    DOB: 1966-02-03, 51 y.o.   MRN: [id number]  PCP: Wendie Agreste, MD  Chief Complaint  Patient presents with  . Hypertension    f/u  . Medication Refill    Norvasc, Crestor, Cilalos and Diovan    Subjective:   Presents for evaluation of HTN.  He was last seen here last fall with a shoulder injury.  Due to lob loss, he lost his insurance and ran out of amlodipine about 3-4 days ago. He also ran out of rosuvastatin, Wellbutrin and Cialis. Now that he is employed and insured, he is re-establishing with his providers and getting back on his medications.  He was seen in the ED yesterday concerned about his blood pressure due to gradually worsening frontal headache and nausea. He reports that he had been at work (seated, reviewing charts), when he developed a sudden headache that worsened such that he was unable to tolerate it. A colleague checked his BP, which was 182/112. He relates that he called here, but due to nausea and headache, was advised to go to the nearest ED.  He went to [facility]. At the time, he thought that he was taking the amlodipine and was out of valsartan, but it was actually the opposite. He was very pleased with the care he received and very complimentary of Dr. Ashok Cordia. He denied CP, abdominal pain and SOB. BP in the ED was 169/92. Examination was normal, though he was anxious. CBC was normal. CMET revealed glucose 122. UA was normal. Troponin was negative. CXR was normal. Head CT was negative. BP improved with pain relief (Dilaudid) and anxiolytic (Ativan). Nausea treated with ondansetron.  Today he reports that he has no recurrent headache, though nausea persists now mild). Still no CP< SOB. No dizziness. No diarrhea. No unexplained joint or muscle pain. No visual disturbance. No weakness.    Review of Systems As above.    Patient Active Problem List   Diagnosis Date Noted  . Memory  difficulty 09/26/2016  . Hemochromatosis 07/11/2016  . Episodic lightheadedness 12/25/2015  . Anemia, unspecified 12/25/2015  . Chronic left-sided low back pain with sciatica 07/15/2015  . Essential hypertension 02/15/2015  . Rotator cuff tear 06/09/2014  . GERD (gastroesophageal reflux disease) 05/13/2014     Prior to Admission medications   Medication Sig Start Date End Date Taking? Authorizing Provider  amLODipine (NORVASC) 10 MG tablet Take 1 tablet (10 mg total) by mouth daily. 03/30/17   Wendie Agreste, MD  ondansetron (ZOFRAN) 8 MG tablet Take 1 tablet (8 mg total) by mouth every 8 (eight) hours as needed for nausea or vomiting. 01/07/16   Hvozdovic, Cecille Rubin P, PA-C  rosuvastatin (CRESTOR) 20 MG tablet Take 1 tablet (20 mg total) by mouth daily. 09/03/16   Robyn Haber, MD  tadalafil (CIALIS) 10 MG tablet Take 0.5 tablets (5 mg total) by mouth daily as needed for erectile dysfunction. 11/28/15   Copland, Gay Filler, MD  valsartan (DIOVAN) 160 MG tablet Take 1 tablet (160 mg total) by mouth daily. 10/05/16   Scot Jun, FNP  valsartan (DIOVAN) 160 MG tablet Take 1 tablet (160 mg total) by mouth daily. 05/09/17  Yes Lajean Saver, MD  buPROPion (WELLBUTRIN XL) 300 MG 24 hr tablet Take 300 mg by mouth daily.    [provider]     Allergies  Allergen Reactions  . Ace Inhibitors Other (See Comments)    ANGIOEDEMA OF  JOINTS- SEVERE       Objective:  Physical Exam  Constitutional: He is oriented to person, place, and time. He appears well-developed and well-nourished. He is active and cooperative. No distress.  BP (!) 166/108 (BP Location: Left Arm, Patient Position: Sitting, Cuff Size: Large)   Pulse 78   Temp 98.6 F (37 C) (Oral)   Resp 18   Ht 5' 10.5" (1.791 m)   Wt 228 lb (103.4 kg)   SpO2 97%   BMI 32.25 kg/m   HENT:  Head: Normocephalic and atraumatic.  Right Ear: Hearing normal.  Left Ear: Hearing normal.  Eyes: Conjunctivae are normal. No scleral  icterus.  Neck: Normal range of motion. Neck supple. No thyromegaly present.  Cardiovascular: Normal rate, regular rhythm and normal heart sounds.   Pulses:      Radial pulses are 2+ on the right side, and 2+ on the left side.  Pulmonary/Chest: Effort normal and breath sounds normal.  Lymphadenopathy:       Head (right side): No tonsillar, no preauricular, no posterior auricular and no occipital adenopathy present.       Head (left side): No tonsillar, no preauricular, no posterior auricular and no occipital adenopathy present.    He has no cervical adenopathy.       Right: No supraclavicular adenopathy present.       Left: No supraclavicular adenopathy present.  Neurological: He is alert and oriented to person, place, and time. No sensory deficit.  Skin: Skin is warm, dry and intact. No rash noted. No cyanosis or erythema. Nails show no clubbing.  Psychiatric: He has a normal mood and affect. His speech is normal and behavior is normal.       Results for orders placed or performed in visit on 05/10/17  POCT glucose (manual entry)  Result Value Ref Range   POC Glucose 98 70 - 99 mg/dl       Assessment & Plan:   Problem List Items Addressed This Visit    Essential hypertension - Primary    Continue valsartan 160 mg and resume amlodipine 10 mg. Recheck in 7-14 days. Plan to increase valsartan to 320 mg, then resume HCTZ 12.5 mg if BP not to goal.      Relevant Medications   amLODipine (NORVASC) 10 MG tablet   rosuvastatin (CRESTOR) 20 MG tablet   tadalafil (CIALIS) 10 MG tablet   valsartan (DIOVAN) 160 MG tablet   Hyperlipidemia    Resume rosuvastatin 5 mg daily. Update lipids today, as he is fasting.      Relevant Medications   amLODipine (NORVASC) 10 MG tablet   rosuvastatin (CRESTOR) 20 MG tablet   tadalafil (CIALIS) 10 MG tablet   valsartan (DIOVAN) 160 MG tablet   Other Relevant Orders   Lipid panel   Erectile dysfunction    Resume Cialis, PRN      Relevant  Medications   tadalafil (CIALIS) 10 MG tablet    Other Visit Diagnoses    Nausea without vomiting       previously on this from psychiatry. May continue PRN.   Relevant Medications   ondansetron (ZOFRAN) 8 MG tablet   Hyperglycemia       normal fasting glucose today. Await A1C.   Relevant Orders   Hemoglobin A1c   POCT glucose (manual entry) (Completed)       Return for re-evaluation of blood pressure in 1-2 weeks, with Dr. Carlota Raspberry or Harrison Mons, PA-C.   Fara Chute, PA-C [facility]

## 2017-05-10 NOTE — Patient Instructions (Addendum)
Proceed with the DASH diet. Take the Diovan 160 mg and amlodipine 10 mg every day. Take the other medications as prescribed.     IF you received an x-ray today, you will receive an invoice from [facility]. Please contact [facility] at [phone] with questions or concerns regarding your invoice.   IF you received labwork today, you will receive an invoice from [facility]. Please contact [facility] at [phone] with questions or concerns regarding your invoice.   Our billing staff will not be able to assist you with questions regarding bills from these companies.  You will be contacted with the lab results as soon as they are available. The fastest way to get your results is to activate your My Chart account. Instructions are located on the last page of this paperwork. If you have not heard from [place] regarding the results in 2 weeks, please contact this office.

## 2017-05-10 NOTE — Assessment & Plan Note (Signed)
Continue valsartan 160 mg and resume amlodipine 10 mg. Recheck in 7-14 days. Plan to increase valsartan to 320 mg, then resume HCTZ 12.5 mg if BP not to goal.

## 2017-05-10 NOTE — Assessment & Plan Note (Signed)
Resume Cialis, PRN

## 2017-05-10 NOTE — Assessment & Plan Note (Signed)
Resume rosuvastatin 5 mg daily. Update lipids today, as he is fasting.

## 2017-05-11 LAB — LIPID PANEL
CHOLESTEROL TOTAL: 253 mg/dL — AB (ref 100–199)
Chol/HDL Ratio: 5.1 ratio — ABNORMAL HIGH (ref 0.0–5.0)
HDL: 50 mg/dL (ref >39)
Triglycerides: 475 mg/dL — ABNORMAL HIGH (ref 0–149)

## 2017-05-11 LAB — HEMOGLOBIN A1C
ESTIMATED AVERAGE GLUCOSE: 120 mg/dL
Hgb A1c MFr Bld: 5.8 % — ABNORMAL HIGH (ref 4.8–5.6)

## 2017-05-17 ENCOUNTER — Encounter: Payer: Self-pay | Admitting: Physician Assistant

## 2017-05-29 ENCOUNTER — Encounter: Payer: Self-pay | Admitting: Family Medicine

## 2017-05-29 ENCOUNTER — Ambulatory Visit (INDEPENDENT_AMBULATORY_CARE_PROVIDER_SITE_OTHER): Payer: 59 | Admitting: Family Medicine

## 2017-05-29 VITALS — BP 146/80 | HR 82 | Temp 98.0°F | Resp 16 | Ht 70.5 in | Wt 232.4 lb

## 2017-05-29 DIAGNOSIS — I10 Essential (primary) hypertension: Secondary | ICD-10-CM

## 2017-05-29 DIAGNOSIS — R7303 Prediabetes: Secondary | ICD-10-CM | POA: Diagnosis not present

## 2017-05-29 DIAGNOSIS — R51 Headache: Secondary | ICD-10-CM

## 2017-05-29 DIAGNOSIS — R519 Headache, unspecified: Secondary | ICD-10-CM

## 2017-05-29 DIAGNOSIS — E785 Hyperlipidemia, unspecified: Secondary | ICD-10-CM

## 2017-05-29 MED ORDER — VALSARTAN-HYDROCHLOROTHIAZIDE 160-12.5 MG PO TABS: 1.0000 | ORAL_TABLET | Freq: Every day | ORAL | 1 refills | Status: DC

## 2017-05-29 NOTE — Patient Instructions (Addendum)
Call psychiatrist for appointment and to discuss plan on restarting Wellbutrin.  May need to start at lower dose and titrate up.   Change valsartan to combo with HCTZ. Continue same dose amlodipine.  If any worsening of headache, blurry vision. Weakness or continued vomiting - call 911 or go to ER.   Plan on recheck of cholesterol and blood pressure in next 3-4 weeks - fasting for 8 hours for bloodwork or have it drawn that morning.   Follow up with ortho to discuss issues if similar issues. If new symptoms, new areas, or acute worsening -  follow up to discuss and possible check Xrays.   Walking for exercise may be best for now.   Return to the clinic or go to the nearest emergency room if any of your symptoms worsen or new symptoms occur.   Prediabetes Eating Plan Prediabetes-also called impaired glucose tolerance or impaired fasting glucose-is a condition that causes blood sugar (blood glucose) levels to be higher than normal. Following a healthy diet can help to keep prediabetes under control. It can also help to lower the risk of type 2 diabetes and heart disease, which are increased in people who have prediabetes. Along with regular exercise, a healthy diet:  Promotes weight loss.  Helps to control blood sugar levels.  Helps to improve the way that the body uses insulin.  What do I need to know about this eating plan?  Use the glycemic index (GI) to plan your meals. The index tells you how quickly a food will raise your blood sugar. Choose low-GI foods. These foods take a longer time to raise blood sugar.  Pay close attention to the amount of carbohydrates in the food that you eat. Carbohydrates increase blood sugar levels.  Keep track of how many calories you take in. Eating the right amount of calories will help you to achieve a healthy weight. Losing about 7 percent of your starting weight can help to prevent type 2 diabetes.  You may want to follow a Mediterranean diet. This  diet includes a lot of vegetables, lean meats or fish, whole grains, fruits, and healthy oils and fats. What foods can I eat? Grains Whole grains, such as whole-wheat or whole-grain breads, crackers, cereals, and pasta. Unsweetened oatmeal. Bulgur. Barley. Quinoa. Brown rice. Corn or whole-wheat flour tortillas or taco shells. Vegetables Lettuce. Spinach. Peas. Beets. Cauliflower. Cabbage. Broccoli. Carrots. Tomatoes. Squash. Eggplant. Herbs. Peppers. Onions. Cucumbers. Brussels sprouts. Fruits Berries. Bananas. Apples. Oranges. Grapes. Papaya. Mango. Pomegranate. Kiwi. Grapefruit. Cherries. Meats and Other Protein Sources Seafood. Lean meats, such as chicken and [place] or lean cuts of pork and beef. Tofu. Eggs. Nuts. Beans. Dairy Low-fat or fat-free dairy products, such as yogurt, cottage cheese, and cheese. Beverages Water. Tea. Coffee. Sugar-free or diet soda. Seltzer water. Milk. Milk alternatives, such as soy or almond milk. Condiments Mustard. Relish. Low-fat, low-sugar ketchup. Low-fat, low-sugar barbecue sauce. Low-fat or fat-free mayonnaise. Sweets and Desserts Sugar-free or low-fat pudding. Sugar-free or low-fat ice cream and other frozen treats. Fats and Oils Avocado. Walnuts. Olive oil. The items listed above may not be a complete list of recommended foods or beverages. Contact your dietitian for more options. What foods are not recommended? Grains Refined white flour and flour products, such as bread, pasta, snack foods, and cereals. Beverages Sweetened drinks, such as sweet iced tea and soda. Sweets and Desserts Baked goods, such as cake, cupcakes, pastries, cookies, and cheesecake. The items listed above may not be a complete list of  foods and beverages to avoid. Contact your dietitian for more information. This information is not intended to replace advice given to you by your health care provider. Make sure you discuss any questions you have with your health care  provider. Document Released: 04/28/2015 Document Revised: 05/19/2016 Document Reviewed: 01/07/2015 Elsevier Interactive Patient Education  2017 [employer].   IF you received an x-ray today, you will receive an invoice from [facility] Radiology. Please contact [facility] Radiology at [phone] with questions or concerns regarding your invoice.   IF you received labwork today, you will receive an invoice from [facility]. Please contact [facility] at [phone] with questions or concerns regarding your invoice.   Our billing staff will not be able to assist you with questions regarding bills from these companies.  You will be contacted with the lab results as soon as they are available. The fastest way to get your results is to activate your My Chart account. Instructions are located on the last page of this paperwork. If you have not heard from [place] regarding the results in 2 weeks, please contact this office.

## 2017-05-29 NOTE — Progress Notes (Signed)
Subjective:    Patient ID: Blake Roberts, male    DOB: [birth year], 51 y.o.   MRN: [id number]  HPI Blake Roberts is a 51 y.o. male Chief Complaint  Patient presents with  . Follow-up    blood pressure    Hypertension: Takes amlodipine 10 mg daily, Diovan 160 mg daily.  Had been off amlodipine for few days d/t insurance lapse when seen on May 16. He was seen in the ED on 05/09/17 due to worsening frontal headache and nausea. Blood pressure was measured at 182/112 out of office, blood pressure 169/92 in the emergency department. Headache, nauseous and anxious. Troponin negative, chest x-ray normal, CBC normal. CMP glucose 122. Head CT was negative. Headache had resolved just with minimal nausea at office visit on 05/10/17 office visit.  Blood pressure 166/108 at that visit. He was continued on valsartan 160 mg and amlodipine 10 mg with likely increase valsartan to 320 mg and possible addition of HCTZ on today's visit.  Prior to running out of meds had been Diovan HCT 160/12.5mg , norvasc 10mg .  Most recently on diovan 160mg  once per day, and norvasc 10mg  once per day.  Home BP's around 140/90's.  150/102 earlier today with a headache. Less headache now. vomited once earlier today.  No focal weakness, no slurred speech. Slight uneasiness in stomach now, feels better than earlier today. Lab Results  Component Value Date   CREATININE 1.21 05/09/2017    Hyperlipidemia: Temporarily off Crestor as above. He was resumed at 5 mg daily at last visit. Initial lipids obtained, planned for recheck lipids  Lab Results  Component Value Date   CHOL 253 (H) 05/10/2017   HDL 50 05/10/2017   LDLCALC Comment 05/10/2017   TRIG 475 (H) 05/10/2017   CHOLHDL 5.1 (H) 05/10/2017   Lab Results  Component Value Date   ALT 24 05/09/2017   AST 33 05/09/2017   ALKPHOS 49 05/09/2017   BILITOT 0.3 05/09/2017    Prediabetes: Wt Readings from Last 3 Encounters:  05/29/17 232 lb 6.4 oz (105.4 kg)  05/10/17  228 lb (103.4 kg)  05/09/17 225 lb (102.1 kg)   Lab Results  Component Value Date   HGBA1C 5.8 (H) 05/10/2017   Anxiety and depression.  Out of Wellbutrin since January. Had been on 300mg  per day.  Ativan and zofran given in ER. Has not been taking benzodiazepine by psychiatry.  Trying to get appointment with psychiatry.  Been more stressed/anxious.  Back to work with [employer].  Depression screen [facility] 2/9 05/29/2017 05/10/2017 10/20/2016 10/17/2016 09/03/2016  Decreased Interest 0 0 0 0 0  Down, Depressed, Hopeless 0 0 0 0 0  PHQ - 2 Score 0 0 0 0 0     Patient Active Problem List   Diagnosis Date Noted  . Hyperlipidemia 05/10/2017  . Erectile dysfunction 05/10/2017  . Memory difficulty 09/26/2016  . Hemochromatosis 07/11/2016  . Episodic lightheadedness 12/25/2015  . Anemia, unspecified 12/25/2015  . Chronic left-sided low back pain with sciatica 07/15/2015  . Essential hypertension 02/15/2015  . Rotator cuff tear 06/09/2014  . GERD (gastroesophageal reflux disease) 05/13/2014   Past Medical History:  Diagnosis Date  . Acute meniscal tear of knee RIGHT KNEE  . Anxiety   . Arthritis RIGHT KNEE  . Bulging lumbar disc L5 - S1  . Depression   . Hemochromatosis   . Hyperlipidemia   . Hypertension    Past Surgical History:  Procedure Laterality Date  .  KNEE ARTHROSCOPY  AGE 31 (APPROX)   RIGHT KNEE  . KNEE ARTHROSCOPY  07/13/2012   Procedure: ARTHROSCOPY KNEE;  Surgeon: Magnus Sinning, MD;  Location: [facility];  Service: Orthopedics;  Laterality: Right;  WITH PARTIAL MEDIAL MENISECTOMY AND REMOVAL OF LOOSE BODIES  . torn rotator cuff     Allergies  Allergen Reactions  . Ace Inhibitors Other (See Comments)    ANGIOEDEMA OF JOINTS- SEVERE   Prior to Admission medications   Medication Sig Start Date End Date Taking? Authorizing Provider  amLODipine (NORVASC) 10 MG tablet Take 1 tablet (10 mg total) by mouth daily. 05/10/17  Yes  Jeffery, Chelle, PA-C  ondansetron (ZOFRAN) 8 MG tablet Take 1 tablet (8 mg total) by mouth every 8 (eight) hours as needed for nausea or vomiting. 05/10/17  Yes Jeffery, Chelle, PA-C  rosuvastatin (CRESTOR) 20 MG tablet Take 1 tablet (20 mg total) by mouth daily. 05/10/17  Yes Jeffery, Chelle, PA-C  tadalafil (CIALIS) 10 MG tablet Take 0.5 tablets (5 mg total) by mouth daily as needed for erectile dysfunction. 05/10/17  Yes Jeffery, Chelle, PA-C  valsartan (DIOVAN) 160 MG tablet Take 1 tablet (160 mg total) by mouth daily. 05/10/17  Yes Jeffery, Chelle, PA-C  buPROPion (WELLBUTRIN XL) 300 MG 24 hr tablet Take 300 mg by mouth daily.    [provider]   Social History   Social History  . Marital status: Legally Separated    Spouse name: N/A  . Number of children: N/A  . Years of education: BA   Occupational History  . [employer]    Social History Main Topics  . Smoking status: Never Smoker  . Smokeless tobacco: Never Used  . Alcohol use Yes     Comment: 4 beers a month  . Drug use: No  . Sexual activity: Yes   Other Topics Concern  . Not on file   Social History Narrative   Lives at home alone   Separated, 2 step-daughters   Right-handed   Caffeine: 2 diet Cokes per day    Review of Systems  Constitutional: Negative for fatigue and unexpected weight change.  Eyes: Negative for visual disturbance.  Respiratory: Negative for cough, chest tightness and shortness of breath.   Cardiovascular: Negative for chest pain, palpitations and leg swelling.  Gastrointestinal: Positive for nausea and vomiting (once. ). Negative for abdominal pain and blood in stool.  Musculoskeletal: Negative for neck stiffness.  Neurological: Positive for headaches. Negative for dizziness and light-headedness.       Objective:   Physical Exam  Constitutional: He is oriented to person, place, and time. He appears well-developed and well-nourished.  HENT:  Head: Normocephalic  and atraumatic.  Eyes: EOM are normal. Pupils are equal, round, and reactive to light.  Neck: No JVD present. Carotid bruit is not present.  Cardiovascular: Normal rate, regular rhythm and normal heart sounds.   No murmur heard. Pulmonary/Chest: Effort normal and breath sounds normal. He has no rales.  Musculoskeletal: He exhibits no edema.  Neurological: He is alert and oriented to person, place, and time.  Skin: Skin is warm and dry.  Psychiatric: He has a normal mood and affect. His behavior is normal. He expresses no suicidal ideation.  Appropriate response, mildly anxious. Good eye contact.   Vitals reviewed.  Vitals:   05/29/17 1609  BP: (!) 146/80  Pulse: 82  Resp: 16  Temp: 98 F (36.7 C)  TempSrc: Oral  SpO2: 96%  Weight:  232 lb 6.4 oz (105.4 kg)  Height: 5' 10.5" (1.791 m)       Assessment & Plan:   Blake Roberts is a 51 y.o. male Hyperlipidemia, unspecified hyperlipidemia type  - Tolerating restarted Crestor. Continue same dose, recheck in 3-4 weeks for fasting labs and repeat office visit to assess blood pressure.  Essential hypertension - Plan: valsartan-hydrochlorothiazide (DIOVAN HCT) 160-12.5 MG tablet  -still uncontrolled.   -Change to Diovan HCT as above. Continue same dose of amlodipine, and recheck in 3-4 weeks.   -ER precautions discussed if headache, weakness, or significant increase in blood pressure as he saw previously.  Prediabetes  -Diet, exercise with low intensity exercise such as walking only for right now. Check A1c  Nonintractable episodic headache, unspecified headache type  -Improved. May be related in part to hypertension, and anxiety. Nonfocal neuro exam.  -Advised to call his psychiatrist to discuss restart of Wellbutrin, dosing, and plan for titration back to previous dose.   - If nausea/vomiting persist, or worsening headache, return here or emergency room.  He also briefly mentions some various arthralgias. He has been seen by  orthopedist in the past for these areas. Advised to contact his orthopedist to discuss if similar symptoms or follow-up if new or acute worsening of those symptoms.  Meds ordered this encounter  Medications  . valsartan-hydrochlorothiazide (DIOVAN HCT) 160-12.5 MG tablet    Sig: Take 1 tablet by mouth daily.    Dispense:  90 tablet    Refill:  1   Patient Instructions   Call psychiatrist for appointment and to discuss plan on restarting Wellbutrin.  May need to start at lower dose and titrate up.   Change valsartan to combo with HCTZ. Continue same dose amlodipine.  If any worsening of headache, blurry vision. Weakness or continued vomiting - call 911 or go to ER.   Plan on recheck of cholesterol and blood pressure in next 3-4 weeks - fasting for 8 hours for bloodwork or have it drawn that morning.   Follow up with ortho to discuss issues if similar issues. If new symptoms, new areas, or acute worsening -  follow up to discuss and possible check Xrays.   Walking for exercise may be best for now.   Return to the clinic or go to the nearest emergency room if any of your symptoms worsen or new symptoms occur.   Prediabetes Eating Plan Prediabetes-also called impaired glucose tolerance or impaired fasting glucose-is a condition that causes blood sugar (blood glucose) levels to be higher than normal. Following a healthy diet can help to keep prediabetes under control. It can also help to lower the risk of type 2 diabetes and heart disease, which are increased in people who have prediabetes. Along with regular exercise, a healthy diet:  Promotes weight loss.  Helps to control blood sugar levels.  Helps to improve the way that the body uses insulin.  What do I need to know about this eating plan?  Use the glycemic index (GI) to plan your meals. The index tells you how quickly a food will raise your blood sugar. Choose low-GI foods. These foods take a longer time to raise blood  sugar.  Pay close attention to the amount of carbohydrates in the food that you eat. Carbohydrates increase blood sugar levels.  Keep track of how many calories you take in. Eating the right amount of calories will help you to achieve a healthy weight. Losing about 7 percent of your starting weight  can help to prevent type 2 diabetes.  You may want to follow a Mediterranean diet. This diet includes a lot of vegetables, lean meats or fish, whole grains, fruits, and healthy oils and fats. What foods can I eat? Grains Whole grains, such as whole-wheat or whole-grain breads, crackers, cereals, and pasta. Unsweetened oatmeal. Bulgur. Barley. Quinoa. Brown rice. Corn or whole-wheat flour tortillas or taco shells. Vegetables Lettuce. Spinach. Peas. Beets. Cauliflower. Cabbage. Broccoli. Carrots. Tomatoes. Squash. Eggplant. Herbs. Peppers. Onions. Cucumbers. Brussels sprouts. Fruits Berries. Bananas. Apples. Oranges. Grapes. Papaya. Mango. Pomegranate. Kiwi. Grapefruit. Cherries. Meats and Other Protein Sources Seafood. Lean meats, such as chicken and [place] or lean cuts of pork and beef. Tofu. Eggs. Nuts. Beans. Dairy Low-fat or fat-free dairy products, such as yogurt, cottage cheese, and cheese. Beverages Water. Tea. Coffee. Sugar-free or diet soda. Seltzer water. Milk. Milk alternatives, such as soy or almond milk. Condiments Mustard. Relish. Low-fat, low-sugar ketchup. Low-fat, low-sugar barbecue sauce. Low-fat or fat-free mayonnaise. Sweets and Desserts Sugar-free or low-fat pudding. Sugar-free or low-fat ice cream and other frozen treats. Fats and Oils Avocado. Walnuts. Olive oil. The items listed above may not be a complete list of recommended foods or beverages. Contact your dietitian for more options. What foods are not recommended? Grains Refined white flour and flour products, such as bread, pasta, snack foods, and cereals. Beverages Sweetened drinks, such as sweet iced tea and  soda. Sweets and Desserts Baked goods, such as cake, cupcakes, pastries, cookies, and cheesecake. The items listed above may not be a complete list of foods and beverages to avoid. Contact your dietitian for more information. This information is not intended to replace advice given to you by your health care provider. Make sure you discuss any questions you have with your health care provider. Document Released: 04/28/2015 Document Revised: 05/19/2016 Document Reviewed: 01/07/2015 Elsevier Interactive Patient Education  2017 [employer].   IF you received an x-ray today, you will receive an invoice from [facility]. Please contact [facility] at [phone] with questions or concerns regarding your invoice.   IF you received labwork today, you will receive an invoice from [place] [facility]. Please contact [facility] at [phone] with questions or concerns regarding your invoice.   Our billing staff will not be able to assist you with questions regarding bills from these companies.  You will be contacted with the lab results as soon as they are available. The fastest way to get your results is to activate your My Chart account. Instructions are located on the last page of this paperwork. If you have not heard from [place] regarding the results in 2 weeks, please contact this office.       Signed,   Merri Ray, MD [facility] [place].  05/31/17 4:15 PM

## 2017-05-30 MED FILL — VALSARTAN-HCTZ 160-12.5 MG: 160-12.5 | 30 days supply | Qty: 30 | Fill #0

## 2017-06-22 ENCOUNTER — Encounter: Payer: Self-pay | Admitting: Family Medicine

## 2017-06-22 ENCOUNTER — Other Ambulatory Visit: Payer: Self-pay | Admitting: Family Medicine

## 2017-06-22 ENCOUNTER — Ambulatory Visit (INDEPENDENT_AMBULATORY_CARE_PROVIDER_SITE_OTHER): Payer: 59 | Admitting: Family Medicine

## 2017-06-22 VITALS — BP 115/76 | HR 82 | Temp 98.0°F | Resp 18 | Ht 71.65 in | Wt 232.0 lb

## 2017-06-22 DIAGNOSIS — I10 Essential (primary) hypertension: Secondary | ICD-10-CM | POA: Diagnosis not present

## 2017-06-22 DIAGNOSIS — E785 Hyperlipidemia, unspecified: Secondary | ICD-10-CM | POA: Diagnosis not present

## 2017-06-22 DIAGNOSIS — F1011 Alcohol abuse, in remission: Secondary | ICD-10-CM

## 2017-06-22 NOTE — Progress Notes (Signed)
Subjective:  By signing my name below, I, Moises Blood, attest that this documentation has been prepared under the direction and in the presence of Merri Ray, MD. Electronically Signed: Moises Blood, [place]. 06/22/2017 , 4:26 PM .  Patient was seen in Room 27 .   Patient ID: Blake Roberts, male    DOB: 1966/10/16, 51 y.o.   MRN: [id number] Chief Complaint  Patient presents with  . Hypertension    Follow-up  . Hyperlipidemia   HPI Blake Roberts is a 51 y.o. male Here for follow up.   Hemochromatosis He will follow up with Dr. Irene Limbo at the [facility] regarding his ferritin levels.   HTN Lab Results  Component Value Date   CREATININE 1.21 05/09/2017   He is on Norvasc 10mg  QD and Diovan-HCT 160-12.5mg  QD. See office visit on June 4th, BP was 146/80 at that time, and elevated previously including ER visit. His headaches were improving at last visit. Changed to Diovan-HCT at last visit.   Patient's BP is at 115/76 at triage today. He denies any lightheadedness or dizziness with the medication. He's been doing more exercises. He's been trying to follow the DASH diet. He checks his BP at work, running in the low 120s / low 80s.   He had an episode of headache + lightheadedness about 3 weeks ago. He went to a Pharmacist, hospital and was informed by a nurse that he's going through withdrawal. He informs increased drinking when he was going through his divorce, up to about 12-pack of beer each weekday, and sometimes double on the weekends. He's followed by [facility]. He's been placed on Naltrexone for detoxication. Last alcohol use was 2.5 weeks ago. He's been going to [facility] meetings too, 3 times a week. He states he's feeling great right now.   Hyperlipidemia Lab Results  Component Value Date   CHOL 253 (H) 05/10/2017   HDL 50 05/10/2017   LDLCALC Comment 05/10/2017   TRIG 475 (H) 05/10/2017   CHOLHDL 5.1 (H) 05/10/2017   Lab Results  Component Value Date   ALT 24 05/09/2017   AST 33 05/09/2017   ALKPHOS 49 05/09/2017   BILITOT 0.3 05/09/2017   Plan for recheck lipids on medications, as Crestor was just restarted back in May.   Patient Active Problem List   Diagnosis Date Noted  . Hyperlipidemia 05/10/2017  . Erectile dysfunction 05/10/2017  . Memory difficulty 09/26/2016  . Hemochromatosis 07/11/2016  . Episodic lightheadedness 12/25/2015  . Anemia, unspecified 12/25/2015  . Chronic left-sided low back pain with sciatica 07/15/2015  . Essential hypertension 02/15/2015  . Rotator cuff tear 06/09/2014  . GERD (gastroesophageal reflux disease) 05/13/2014   Past Medical History:  Diagnosis Date  . Acute meniscal tear of knee RIGHT KNEE  . Anxiety   . Arthritis RIGHT KNEE  . Bulging lumbar disc L5 - S1  . Depression   . Hemochromatosis   . Hyperlipidemia   . Hypertension    Past Surgical History:  Procedure Laterality Date  . KNEE ARTHROSCOPY  AGE 71 (APPROX)   RIGHT KNEE  . KNEE ARTHROSCOPY  07/13/2012   Procedure: ARTHROSCOPY KNEE;  Surgeon: Magnus Sinning, MD;  Location: [facility];  Service: Orthopedics;  Laterality: Right;  WITH PARTIAL MEDIAL MENISECTOMY AND REMOVAL OF LOOSE BODIES  . torn rotator cuff     Allergies  Allergen Reactions  . Ace Inhibitors Other (See Comments)    ANGIOEDEMA OF JOINTS- SEVERE   Prior to Admission  medications   Medication Sig Start Date End Date Taking? Authorizing Provider  amLODipine (NORVASC) 10 MG tablet Take 1 tablet (10 mg total) by mouth daily. 05/10/17  Yes Jeffery, Chelle, PA-C  buPROPion (WELLBUTRIN XL) 300 MG 24 hr tablet Take 300 mg by mouth daily.   Yes [provider]  naltrexone (DEPADE) 50 MG tablet Take by mouth daily.   Yes [provider]  ondansetron (ZOFRAN) 8 MG tablet Take 1 tablet (8 mg total) by mouth every 8 (eight) hours as needed for nausea or vomiting. 05/10/17  Yes Jeffery, Chelle, PA-C  rosuvastatin (CRESTOR) 20 MG tablet Take 1 tablet (20 mg  total) by mouth daily. 05/10/17  Yes Jeffery, Chelle, PA-C  tadalafil (CIALIS) 10 MG tablet Take 0.5 tablets (5 mg total) by mouth daily as needed for erectile dysfunction. 05/10/17  Yes Jeffery, Chelle, PA-C  valsartan-hydrochlorothiazide (DIOVAN HCT) 160-12.5 MG tablet Take 1 tablet by mouth daily. 05/29/17  Yes Wendie Agreste, MD   Social History   Social History  . Marital status: Legally Separated    Spouse name: N/A  . Number of children: N/A  . Years of education: BA   Occupational History  . [employer]    Social History Main Topics  . Smoking status: Never Smoker  . Smokeless tobacco: Never Used  . Alcohol use Yes     Comment: 4 beers a month  . Drug use: No  . Sexual activity: Yes   Other Topics Concern  . Not on file   Social History Narrative   Lives at home alone   Separated, 2 step-daughters   Right-handed   Caffeine: 2 diet Cokes per day   Review of Systems  Constitutional: Negative for fatigue and unexpected weight change.  Eyes: Negative for visual disturbance.  Respiratory: Negative for cough, chest tightness and shortness of breath.   Cardiovascular: Negative for chest pain, palpitations and leg swelling.  Gastrointestinal: Negative for abdominal pain and blood in stool.  Neurological: Negative for dizziness, light-headedness and headaches.       Objective:   Physical Exam  Constitutional: He is oriented to person, place, and time. He appears well-developed and well-nourished.  HENT:  Head: Normocephalic and atraumatic.  Eyes: EOM are normal. Pupils are equal, round, and reactive to light.  Neck: No JVD present. Carotid bruit is not present.  Cardiovascular: Normal rate, regular rhythm and normal heart sounds.   No murmur heard. Pulmonary/Chest: Effort normal and breath sounds normal. He has no rales.  Musculoskeletal: He exhibits no edema.  Neurological: He is alert and oriented to person, place, and time.  Skin: Skin is warm and  dry.  Psychiatric: He has a normal mood and affect.  Vitals reviewed.   Vitals:   06/22/17 1536  BP: 115/76  Pulse: 82  Resp: 18  Temp: 98 F (36.7 C)  SpO2: 95%  Weight: 232 lb (105.2 kg)  Height: 5' 11.65" (1.82 m)   Lab Results  Component Value Date   WBC 7.0 05/09/2017   HGB 13.9 05/09/2017   HCT 39.7 05/09/2017   MCV 92.8 05/09/2017   PLT 269 05/09/2017       Assessment & Plan:    Blake Roberts is a 51 y.o. male Essential hypertension - Plan: Comprehensive metabolic panel  - Improved control, likely in part related to decrease in alcohol. Continue same dose of medication for now, but option of decreasing amlodipine if blood pressure lowers was discussed. Goal of under  130/80, recheck in 3-6 months.  Hyperlipidemia, unspecified hyperlipidemia type - Plan: Lipid panel  -Check lipid panel, continue same dose of Crestor at this time as tolerating well.  Hemochromatosis, unspecified hemochromatosis type  -Recommended follow-up with his hematologist. Most recent CBC/HGB looked okay.  Alcohol abuse, in remission  -Commended on cessation. Likely will impact multiple parts of his health positively including blood pressure and anxiety. May have had some component of alcohol/substance induced mood disorder.  -Continue AA meetings, and outpatient treatments with naltrexone..   Patient Instructions   Blood pressure looks good at this point. Alcohol use was likely contributing to your symptoms. Keep up with counseling at [facility] and maintenance meds.  Continue AA meetings.  Keep up the good work!  If blood pressure is running lower off alcohol, then can decrease amlodipine to 5mg  per day (1/2 pill).   I will check cholesterol levels, no change in meds for now.   Keep a record of your blood pressures outside of the office goal of under 130/80.   Follow up with hematologist regarding hemochromatosis.    IF you received an x-ray today, you will receive an invoice  from [facility]. Please contact [facility] at [phone] with questions or concerns regarding your invoice.   IF you received labwork today, you will receive an invoice from [place]. Please contact [facility] at [phone] with questions or concerns regarding your invoice.   Our billing staff will not be able to assist you with questions regarding bills from these companies.  You will be contacted with the lab results as soon as they are available. The fastest way to get your results is to activate your My Chart account. Instructions are located on the last page of this paperwork. If you have not heard from [place] regarding the results in 2 weeks, please contact this office.       I personally performed the services described in this documentation, which was scribed in my presence. The recorded information has been reviewed and considered for accuracy and completeness, addended by me as needed, and agree with information above.  Signed,   Merri Ray, MD [facility] [place].  06/25/17 12:59 PM

## 2017-06-22 NOTE — Patient Instructions (Addendum)
Blood pressure looks good at this point. Alcohol use was likely contributing to your symptoms. Keep up with counseling at [facility] and maintenance meds.  Continue AA meetings.  Keep up the good work!  If blood pressure is running lower off alcohol, then can decrease amlodipine to 5mg  per day (1/2 pill).   I will check cholesterol levels, no change in meds for now.   Keep a record of your blood pressures outside of the office goal of under 130/80.   Follow up with hematologist regarding hemochromatosis.    IF you received an x-ray today, you will receive an invoice from [facility]. Please contact [facility] at [phone] with questions or concerns regarding your invoice.   IF you received labwork today, you will receive an invoice from [facility]. Please contact [facility] at [phone] with questions or concerns regarding your invoice.   Our billing staff will not be able to assist you with questions regarding bills from these companies.  You will be contacted with the lab results as soon as they are available. The fastest way to get your results is to activate your My Chart account. Instructions are located on the last page of this paperwork. If you have not heard from [place] regarding the results in 2 weeks, please contact this office.

## 2017-06-23 LAB — COMPREHENSIVE METABOLIC PANEL
ALBUMIN: 4.5 g/dL (ref 3.5–5.5)
ALK PHOS: 46 IU/L (ref 39–117)
ALT: 32 IU/L (ref 0–44)
AST: 18 IU/L (ref 0–40)
Albumin/Globulin Ratio: 1.6 (ref 1.2–2.2)
BUN / CREAT RATIO: 13 (ref 9–20)
BUN: 17 mg/dL (ref 6–24)
Bilirubin Total: 0.2 mg/dL (ref 0.0–1.2)
CO2: 22 mmol/L (ref 20–29)
CREATININE: 1.31 mg/dL — AB (ref 0.76–1.27)
Calcium: 10 mg/dL (ref 8.7–10.2)
Chloride: 105 mmol/L (ref 96–106)
GFR calc Af Amer: 72 mL/min/{1.73_m2} (ref >59)
GFR calc non Af Amer: 63 mL/min/{1.73_m2} (ref >59)
GLUCOSE: 106 mg/dL — AB (ref 65–99)
Globulin, Total: 2.8 g/dL (ref 1.5–4.5)
Potassium: 5.1 mmol/L (ref 3.5–5.2)
Sodium: 143 mmol/L (ref 134–144)
Total Protein: 7.3 g/dL (ref 6.0–8.5)

## 2017-06-23 LAB — LIPID PANEL
CHOLESTEROL TOTAL: 126 mg/dL (ref 100–199)
Chol/HDL Ratio: 3 ratio (ref 0.0–5.0)
HDL: 42 mg/dL (ref >39)
LDL CALC: 62 mg/dL (ref 0–99)
TRIGLYCERIDES: 110 mg/dL (ref 0–149)
VLDL Cholesterol Cal: 22 mg/dL (ref 5–40)

## 2017-08-14 ENCOUNTER — Other Ambulatory Visit: Payer: Self-pay | Admitting: Family Medicine

## 2017-08-14 MED ORDER — LOSARTAN POTASSIUM-HCTZ 100-12.5 MG PO TABS: 1.0000 | ORAL_TABLET | Freq: Every day | ORAL | 1 refills | Status: DC

## 2017-08-14 NOTE — Progress Notes (Signed)
Request for change in medication from [facility]. Clarified with patient, he has been taking valsartan160/HCTZ12.5, not just valsartan. Will change to losartan HCT 100 mg/12.5 mg QD.

## 2017-09-05 ENCOUNTER — Telehealth: Payer: Self-pay | Admitting: Family Medicine

## 2017-09-05 NOTE — Telephone Encounter (Signed)
Pt is needing to talk with Dr. Carlota Raspberry about changing crestor it is too expensive   Best number [phone]

## 2017-09-06 ENCOUNTER — Encounter: Payer: Self-pay | Admitting: Physician Assistant

## 2017-09-06 ENCOUNTER — Other Ambulatory Visit: Payer: Self-pay | Admitting: Physician Assistant

## 2017-09-06 ENCOUNTER — Ambulatory Visit (INDEPENDENT_AMBULATORY_CARE_PROVIDER_SITE_OTHER): Payer: 59

## 2017-09-06 ENCOUNTER — Ambulatory Visit (INDEPENDENT_AMBULATORY_CARE_PROVIDER_SITE_OTHER): Payer: 59 | Admitting: Physician Assistant

## 2017-09-06 VITALS — BP 120/79 | HR 76 | Temp 98.2°F | Resp 18 | Ht 71.0 in | Wt 226.8 lb

## 2017-09-06 DIAGNOSIS — M79674 Pain in right toe(s): Secondary | ICD-10-CM

## 2017-09-06 MED ORDER — INDOMETHACIN 50 MG PO CAPS: 50.0000 mg | ORAL_CAPSULE | Freq: Three times a day (TID) | ORAL | 0 refills | Status: DC

## 2017-09-06 MED ORDER — COLCHICINE 0.6 MG PO CAPS: 0.6000 mg | ORAL_CAPSULE | Freq: Two times a day (BID) | ORAL | 0 refills | Status: DC | PRN

## 2017-09-06 NOTE — Progress Notes (Signed)
Patient ID: Blake Roberts, male    DOB: 21-Apr-1966, 51 y.o.   MRN: [id number]  PCP: Wendie Agreste, MD  Chief Complaint  Patient presents with  . Gout    right foot, big toe, x3-4 days, Pt states toe is swollen, and throbbing, pt states he had tries OTC the meds and it gave him a little bit of relief.    Subjective:   Presents for evaluation of 4 days of pain, redness and swelling at the base of the RIGHT great toe.  Similar episode 7-10 years ago, treated as gout with rapid improvement.  No trauma or injury. Pain is exquisite with weight bearing, movement, light touch. OTC NSAIDS without significant improvement. His job requires standing and walking frequently. He has crutches at home from previous knee problem.  Chart review reveals a mild elevation of his creatinine in June. He recently stopped losartan-HCTZ as his BP was well controlled. He continues amlodipine.    Review of Systems As above.    Patient Active Problem List   Diagnosis Date Noted  . Hyperlipidemia 05/10/2017  . Erectile dysfunction 05/10/2017  . Memory difficulty 09/26/2016  . Hemochromatosis 07/11/2016  . Episodic lightheadedness 12/25/2015  . Anemia, unspecified 12/25/2015  . Chronic left-sided low back pain with sciatica 07/15/2015  . Essential hypertension 02/15/2015  . Rotator cuff tear 06/09/2014  . GERD (gastroesophageal reflux disease) 05/13/2014     Prior to Admission medications   Medication Sig Start Date End Date Taking? Authorizing Provider  amLODipine (NORVASC) 10 MG tablet Take 1 tablet (10 mg total) by mouth daily. 05/10/17  Yes Coal Nearhood, PA-C  buPROPion (WELLBUTRIN XL) 300 MG 24 hr tablet Take 300 mg by mouth daily.   Yes [provider]  naltrexone (DEPADE) 50 MG tablet Take by mouth daily.   Yes [provider]  rosuvastatin (CRESTOR) 20 MG tablet Take 1 tablet (20 mg total) by mouth daily. 05/10/17  Yes Cosme Jacob, PA-C  tadalafil  (CIALIS) 10 MG tablet Take 0.5 tablets (5 mg total) by mouth daily as needed for erectile dysfunction. 05/10/17  Yes Nyelli Samara, PA-C  ondansetron (ZOFRAN) 8 MG tablet Take 1 tablet (8 mg total) by mouth every 8 (eight) hours as needed for nausea or vomiting. Patient not taking: Reported on 09/06/2017 05/10/17   Harrison Mons, PA-C     Allergies  Allergen Reactions  . Ace Inhibitors Other (See Comments)    ANGIOEDEMA OF JOINTS- SEVERE       Objective:  Physical Exam  Constitutional: He is oriented to person, place, and time. He appears well-developed and well-nourished. He is active and cooperative. No distress.  BP 120/79 (BP Location: Right Arm, Patient Position: Sitting, Cuff Size: Large)   Pulse 76   Temp 98.2 F (36.8 C) (Oral)   Resp 18   Ht 5\' 11"  (1.803 m)   Wt 226 lb 12.8 oz (102.9 kg)   SpO2 97%   BMI 31.63 kg/m    Eyes: Conjunctivae are normal.  Pulmonary/Chest: Effort normal.  Musculoskeletal:       Right ankle: Normal. Achilles tendon normal.       Left ankle: Normal. Achilles tendon normal.       Right foot: There is decreased range of motion, tenderness, bony tenderness and swelling. There is normal capillary refill, no crepitus, no deformity and no laceration.       Left foot: Normal.       Feet:  Neurological: He is alert  and oriented to person, place, and time.  Skin: Skin is warm. There is erythema (RIGHT 1st MTP).  Psychiatric: He has a normal mood and affect. His speech is normal and behavior is normal.   Dg Toe Great Right  Result Date: 09/06/2017 CLINICAL DATA:  Right first MTP joint region pain, swelling, erythema and rubor. No reported injury. EXAM: RIGHT GREAT TOE COMPARISON:  None. FINDINGS: Soft tissue swelling medial to the first metatarsophalangeal joint. No fracture or dislocation. No suspicious focal osseous lesion. No articular, juxta-articular or cortical erosions. No periosteal reaction. Small Achilles and plantar right calcaneal spurs.  No radiopaque foreign body. No tophaceous soft tissue calcifications. IMPRESSION: Soft tissue swelling medial to the first MTP joint. No evidence of erosive arthropathy. No specific radiographic findings of osteomyelitis. No tophaceous soft tissue calcifications. Electronically Signed   By: Ilona Sorrel M.D.   On: 09/06/2017 12:58       Assessment & Plan:   1. Great toe pain, right Likely gout. Short course of indomethacin and colchicine, due to recent elevation of serum creatinine. Consider uric acid level when symptoms resolved, along with repeat creatinine. Hydrate. Rest. Use crutches to reduce weight bearing/pain with ambulation. - DG Toe Great Right; Future - indomethacin (INDOCIN) 50 MG capsule; Take 1 capsule (50 mg total) by mouth 3 (three) times daily with meals.  Dispense: 30 capsule; Refill: 0 - Colchicine 0.6 MG CAPS; Take 0.6 mg by mouth 2 (two) times daily as needed (gout pain).  Dispense: 20 capsule; Refill: 0    Return if symptoms worsen or fail to improve.   Fara Chute, PA-C [facility]

## 2017-09-06 NOTE — Patient Instructions (Signed)
     IF you received an x-ray today, you will receive an invoice from [facility]. Please contact [facility] at [phone] with questions or concerns regarding your invoice.   IF you received labwork today, you will receive an invoice from [facility]. Please contact [facility] at [phone] with questions or concerns regarding your invoice.   Our billing staff will not be able to assist you with questions regarding bills from these companies.  You will be contacted with the lab results as soon as they are available. The fastest way to get your results is to activate your My Chart account. Instructions are located on the last page of this paperwork. If you have not heard from us regarding the results in 2 weeks, please contact this office.     

## 2017-09-07 NOTE — Telephone Encounter (Signed)
Please advise 

## 2017-09-07 NOTE — Telephone Encounter (Signed)
We can prescribe Lipitor 20 mg if that would be covered better. Please have him check with his pharmacist and let me know if that is a better option.  He can let me know what he finds out through my chart message.

## 2017-09-12 NOTE — Telephone Encounter (Signed)
Left a message for patient to let us know if he wants to change to Lipitor

## 2017-09-13 NOTE — Telephone Encounter (Signed)
PATIENT WOULD LIKE DR. GREENE TO KNOW THAT HE DOES WANT TO SWITCH FROM CRESTOR TO  LIPITOR BECAUSE IT WILL BE CHEAPER WITH HIS [employer]. BEST PHONE [phone] (CELL) PHARMACY CHOICE IS [facility] ON [address]. [place]

## 2017-09-15 NOTE — Telephone Encounter (Signed)
Please advise 

## 2017-09-16 MED ORDER — ATORVASTATIN CALCIUM 20 MG PO TABS: 20.0000 mg | ORAL_TABLET | Freq: Every day | ORAL | 1 refills | Status: DC

## 2017-09-16 NOTE — Telephone Encounter (Signed)
lipitor 20mg sent 

## 2017-12-04 ENCOUNTER — Ambulatory Visit: Payer: Self-pay | Admitting: *Deleted

## 2017-12-04 NOTE — Telephone Encounter (Signed)
History    Gout  Woke   painfull r   Big  Toe   No  Injury  Slight swelling  painfull to  Touch .  appt   With   appt  With  Dr   Carlota Raspberry    Reason for Disposition . Pain in the big toe joint  Answer Assessment - Initial Assessment Questions 1. ONSET: "When did the pain start?"       Woke  Up with  yest  Am   2. LOCATION: "Where is the pain located?"        r  Great  Toe   3. PAIN: "How bad is the pain?"    (Scale 1-10; or mild, moderate, severe)   -  MILD (1-3): doesn't interfere with normal activities    -  MODERATE (4-7): interferes with normal activities (e.g., work or school) or awakens from sleep, limping    -  SEVERE (8-10): excruciating pain, unable to do any normal activities, unable to walk      Mild    4. WORK OR EXERCISE: "Has there been any recent work or exercise that involved this part of the body?"       no 5. CAUSE: "What do you think is causing the foot pain?"     gout 6. OTHER SYMPTOMS: "Do you have any other symptoms?" (e.g., leg pain, rash, fever, numbness)      Swelling   7. PREGNANCY: "Is there any chance you are pregnant?" "When was your last menstrual period?"     n/a  Protocols used: FOOT PAIN-A-AH

## 2017-12-05 ENCOUNTER — Encounter: Payer: Self-pay | Admitting: Family Medicine

## 2017-12-05 ENCOUNTER — Ambulatory Visit: Payer: BLUE CROSS/BLUE SHIELD | Admitting: Family Medicine

## 2017-12-05 ENCOUNTER — Other Ambulatory Visit: Payer: Self-pay

## 2017-12-05 ENCOUNTER — Ambulatory Visit: Payer: Self-pay | Admitting: Family Medicine

## 2017-12-05 VITALS — BP 128/82 | HR 85 | Temp 98.3°F | Resp 18 | Ht 71.0 in | Wt 219.8 lb

## 2017-12-05 DIAGNOSIS — M109 Gout, unspecified: Secondary | ICD-10-CM

## 2017-12-05 DIAGNOSIS — M79674 Pain in right toe(s): Secondary | ICD-10-CM

## 2017-12-05 MED ORDER — COLCHICINE 0.6 MG PO CAPS: ORAL_CAPSULE | ORAL | 0 refills | Status: DC

## 2017-12-05 MED ORDER — INDOMETHACIN 50 MG PO CAPS: 50.0000 mg | ORAL_CAPSULE | Freq: Three times a day (TID) | ORAL | 0 refills | Status: DC

## 2017-12-05 NOTE — Patient Instructions (Addendum)
Start colchicine 2 pills now, 1 pill in one hour if needed. Starting tonight can change to indomethacin up to 3 times per day. Make sure you take that with food. If pain is not improving in the next 5-7 days, call and I can send in a prescription for prednisone. Either way follow up in 4-6 weeks for an appointment so we can discuss blood pressure, and check to gout test at that time.  Let me know if I can help with any resources to continue to maintain sobriety.   Gout Gout is painful swelling that can occur in some of your joints. Gout is a type of arthritis. This condition is caused by having too much uric acid in your body. Uric acid is a chemical that forms when your body breaks down substances called purines. Purines are important for building body proteins. When your body has too much uric acid, sharp crystals can form and build up inside your joints. This causes pain and swelling. Gout attacks can happen quickly and be very painful (acute gout). Over time, the attacks can affect more joints and become more frequent (chronic gout). Gout can also cause uric acid to build up under your skin and inside your kidneys. What are the causes? This condition is caused by too much uric acid in your blood. This can occur because:  Your kidneys do not remove enough uric acid from your blood. This is the most common cause.  Your body makes too much uric acid. This can occur with some cancers and cancer treatments. It can also occur if your body is breaking down too many red blood cells (hemolytic anemia).  You eat too many foods that are high in purines. These foods include organ meats and some seafood. Alcohol, especially beer, is also high in purines.  A gout attack may be triggered by trauma or stress. What increases the risk? This condition is more likely to develop in people who:  Have a family history of gout.  Are male and middle-aged.  Are male and have gone through menopause.  Are  obese.  Frequently drink alcohol, especially beer.  Are dehydrated.  Lose weight too quickly.  Have an organ transplant.  Have lead poisoning.  Take certain medicines, including aspirin, cyclosporine, diuretics, levodopa, and niacin.  Have kidney disease or psoriasis.  What are the signs or symptoms? An attack of acute gout happens quickly. It usually occurs in just one joint. The most common place is the big toe. Attacks often start at night. Other joints that may be affected include joints of the feet, ankle, knee, fingers, wrist, or elbow. Symptoms may include:  Severe pain.  Warmth.  Swelling.  Stiffness.  Tenderness. The affected joint may be very painful to touch.  Shiny, red, or purple skin.  Chills and fever.  Chronic gout may cause symptoms more frequently. More joints may be involved. You may also have white or yellow lumps (tophi) on your hands or feet or in other areas near your joints. How is this diagnosed? This condition is diagnosed based on your symptoms, medical history, and physical exam. You may have tests, such as:  Blood tests to measure uric acid levels.  Removal of joint fluid with a needle (aspiration) to look for uric acid crystals.  X-rays to look for joint damage.  How is this treated? Treatment for this condition has two phases: treating an acute attack and preventing future attacks. Acute gout treatment may include medicines to reduce pain and  swelling, including:  NSAIDs.  Steroids. These are strong anti-inflammatory medicines that can be taken by mouth (orally) or injected into a joint.  Colchicine. This medicine relieves pain and swelling when it is taken soon after an attack. It can be given orally or through an IV tube.  Preventive treatment may include:  Daily use of smaller doses of NSAIDs or colchicine.  Use of a medicine that reduces uric acid levels in your blood.  Changes to your diet. You may need to see a specialist  about healthy eating (dietitian).  Follow these instructions at home: During a Gout Attack  If directed, apply ice to the affected area: ? Put ice in a plastic bag. ? Place a towel between your skin and the bag. ? Leave the ice on for 20 minutes, 2-3 times a day.  Rest the joint as much as possible. If the affected joint is in your leg, you may be given crutches to use.  Raise (elevate) the affected joint above the level of your heart as often as possible.  Drink enough fluids to keep your urine clear or pale yellow.  Take over-the-counter and prescription medicines only as told by your health care provider.  Do not drive or operate heavy machinery while taking prescription pain medicine.  Follow instructions from your health care provider about eating or drinking restrictions.  Return to your normal activities as told by your health care provider. Ask your health care provider what activities are safe for you. Avoiding Future Gout Attacks  Follow a low-purine diet as told by your dietitian or health care provider. Avoid foods and drinks that are high in purines, including liver, kidney, anchovies, asparagus, herring, mushrooms, mussels, and beer.  Limit alcohol intake to no more than 1 drink a day for nonpregnant women and 2 drinks a day for men. One drink equals 12 oz of beer, 5 oz of wine, or 1 oz of hard liquor.  Maintain a healthy weight or lose weight if you are overweight. If you want to lose weight, talk with your health care provider. It is important that you do not lose weight too quickly.  Start or maintain an exercise program as told by your health care provider.  Drink enough fluids to keep your urine clear or pale yellow.  Take over-the-counter and prescription medicines only as told by your health care provider.  Keep all follow-up visits as told by your health care provider. This is important. Contact a health care provider if:  You have another gout  attack.  You continue to have symptoms of a gout attack after10 days of treatment.  You have side effects from your medicines.  You have chills or a fever.  You have burning pain when you urinate.  You have pain in your lower back or belly. Get help right away if:  You have severe or uncontrolled pain.  You cannot urinate. This information is not intended to replace advice given to you by your health care provider. Make sure you discuss any questions you have with your health care provider. Document Released: 12/09/2000 Document Revised: 05/19/2016 Document Reviewed: 09/24/2015 Elsevier Interactive Patient Education  2017 [employer].   IF you received an x-ray today, you will receive an invoice from [facility]. Please contact [facility] at [phone] with questions or concerns regarding your invoice.   IF you received labwork today, you will receive an invoice from [facility]. Please contact [facility] at [phone] with questions or concerns regarding your invoice.  Our billing staff will not be able to assist you with questions regarding bills from these companies.  You will be contacted with the lab results as soon as they are available. The fastest way to get your results is to activate your My Chart account. Instructions are located on the last page of this paperwork. If you have not heard from us regarding the results in 2 weeks, please contact this office.     

## 2017-12-05 NOTE — Progress Notes (Signed)
Subjective:  By signing my name below, I, Blake Roberts, attest that this documentation has been prepared under the direction and in the presence of Wendie Agreste, MD Electronically Signed: Ladene Artist, ED Scribe 12/05/2017 at 11:31 AM.   Patient ID: Blake Roberts, male    DOB: 12-Aug-1966, 51 y.o.   MRN: [id number]  Chief Complaint  Patient presents with  . great toe pain    right toe, onset: Saturday morning, icing to help with pain and seems to helpp.  Pain level 3/10   HPI Blake Roberts is a 51 y.o. male who presents to Primary Care at [facility] complaining of gradually worsening R great toe pain onset 2 days ago upon waking. Pt noticed swelling and redness later that night. No known injury He reports increased pain even with light touch. He has tried icing his toe with some relief. Pt reports a h/o gout with his last flare being in September. Pt was treated with indomethacin and colchicine. Pt has been treated for alcohol abuse in the past. Reports that he relapsed in October with his last drink being 5 days ago. He is now going through treatment with his psychiatrist.   Patient Active Problem List   Diagnosis Date Noted  . Hyperlipidemia 05/10/2017  . Erectile dysfunction 05/10/2017  . Memory difficulty 09/26/2016  . Hemochromatosis 07/11/2016  . Episodic lightheadedness 12/25/2015  . Anemia, unspecified 12/25/2015  . Chronic left-sided low back pain with sciatica 07/15/2015  . Essential hypertension 02/15/2015  . Rotator cuff tear 06/09/2014  . GERD (gastroesophageal reflux disease) 05/13/2014   Past Medical History:  Diagnosis Date  . Acute meniscal tear of knee RIGHT KNEE  . Anxiety   . Arthritis RIGHT KNEE  . Bulging lumbar disc L5 - S1  . Depression   . Hemochromatosis   . Hyperlipidemia   . Hypertension    Past Surgical History:  Procedure Laterality Date  . KNEE ARTHROSCOPY  AGE 34 (APPROX)   RIGHT KNEE  . KNEE ARTHROSCOPY  07/13/2012   Procedure:  ARTHROSCOPY KNEE;  Surgeon: Magnus Sinning, MD;  Location: [place];  Service: Orthopedics;  Laterality: Right;  WITH PARTIAL MEDIAL MENISECTOMY AND REMOVAL OF LOOSE BODIES  . torn rotator cuff     Allergies  Allergen Reactions  . Ace Inhibitors Other (See Comments)    ANGIOEDEMA OF JOINTS- SEVERE   Prior to Admission medications   Medication Sig Start Date End Date Taking? Authorizing Provider  amLODipine (NORVASC) 10 MG tablet Take 1 tablet (10 mg total) by mouth daily. 05/10/17   Harrison Mons, PA-C  atorvastatin (LIPITOR) 20 MG tablet Take 1 tablet (20 mg total) by mouth daily. 09/16/17   Wendie Agreste, MD  buPROPion (WELLBUTRIN XL) 300 MG 24 hr tablet Take 300 mg by mouth daily.    [provider]  Colchicine 0.6 MG CAPS Take 0.6 mg by mouth 2 (two) times daily as needed (gout pain). 09/06/17   Harrison Mons, PA-C  indomethacin (INDOCIN) 50 MG capsule Take 1 capsule (50 mg total) by mouth 3 (three) times daily with meals. 09/06/17   Harrison Mons, PA-C  naltrexone (DEPADE) 50 MG tablet Take by mouth daily.    [provider]  ondansetron (ZOFRAN) 8 MG tablet Take 1 tablet (8 mg total) by mouth every 8 (eight) hours as needed for nausea or vomiting. Patient not taking: Reported on 09/06/2017 05/10/17   Harrison Mons, PA-C  tadalafil (CIALIS) 10 MG tablet Take 0.5  tablets (5 mg total) by mouth daily as needed for erectile dysfunction. 05/10/17   Harrison Mons, PA-C   Social History   Socioeconomic History  . Marital status: Legally Separated    Spouse name: Not on file  . Number of children: Not on file  . Years of education: BA  . Highest education level: Not on file  Social Needs  . Financial resource strain: Not on file  . Food insecurity - worry: Not on file  . Food insecurity - inability: Not on file  . Transportation needs - medical: Not on file  . Transportation needs - non-medical: Not on file  Occupational History  .  Occupation: [employer]  Tobacco Use  . Smoking status: Never Smoker  . Smokeless tobacco: Never Used  Substance and Sexual Activity  . Alcohol use: Yes    Comment: 4 beers a month  . Drug use: No  . Sexual activity: Yes  Other Topics Concern  . Not on file  Social History Narrative   Lives at home alone   Separated, 2 step-daughters   Right-handed   Caffeine: 2 diet Cokes per day   Review of Systems  Musculoskeletal: Positive for arthralgias and joint swelling.  Skin: Positive for color change.      Objective:   Physical Exam  Constitutional: He is oriented to person, place, and time. He appears well-developed and well-nourished. No distress.  HENT:  Head: Normocephalic and atraumatic.  Eyes: Conjunctivae and EOM are normal.  Neck: Neck supple. No tracheal deviation present.  Cardiovascular: Normal rate.  Pulmonary/Chest: Effort normal. No respiratory distress.  Musculoskeletal: Normal range of motion.  R foot: faint erythema, warmth surrounding 1st MTP. Pain localized to the MTP of great toe. Skin is intact. No wounds. Pain free ROM of ankle.  Neurological: He is alert and oriented to person, place, and time.  Skin: Skin is warm and dry.  Psychiatric: He has a normal mood and affect. His behavior is normal.  Nursing note and vitals reviewed.  Vitals:   12/05/17 1124  BP: 128/82  Pulse: 85  Resp: 18  Temp: 98.3 F (36.8 C)  TempSrc: Oral  SpO2: 97%  Weight: 219 lb 12.8 oz (99.7 kg)  Height: 5\' 11"  (1.803 m)      Assessment & Plan:    Blake Roberts is a 51 y.o. male Acute gout involving toe of right foot, unspecified cause  Great toe pain, right - Plan: Colchicine 0.6 MG CAPS, indomethacin (INDOCIN) 50 MG capsule  Suspected podagra/gout flare of right great toe. May be related to his prior alcohol use. Now back on program for sobriety.    -No apparent sign of infection, will treat similar to last flare with initial colchicine 2 pills 1, one  in 1 hour if needed. Then change to indomethacin 3 times a day when necessary with food. If not improving in 1 week, call for prednisone prescription, RTC precautions if worsening.  Meds ordered this encounter  Medications  . Colchicine 0.6 MG CAPS    Sig: 2 caps by mouth 1. May take 1 additional In 1 hour if needed    Dispense:  12 capsule    Refill:  0  . indomethacin (INDOCIN) 50 MG capsule    Sig: Take 1 capsule (50 mg total) by mouth 3 (three) times daily with meals.    Dispense:  30 capsule    Refill:  0   Patient Instructions   Start colchicine 2 pills  now, 1 pill in one hour if needed. Starting tonight can change to indomethacin up to 3 times per day. Make sure you take that with food. If pain is not improving in the next 5-7 days, call and I can send in a prescription for prednisone. Either way follow up in 4-6 weeks for an appointment so we can discuss blood pressure, and check to gout test at that time.  Let me know if I can help with any resources to continue to maintain sobriety.   Gout Gout is painful swelling that can occur in some of your joints. Gout is a type of arthritis. This condition is caused by having too much uric acid in your body. Uric acid is a chemical that forms when your body breaks down substances called purines. Purines are important for building body proteins. When your body has too much uric acid, sharp crystals can form and build up inside your joints. This causes pain and swelling. Gout attacks can happen quickly and be very painful (acute gout). Over time, the attacks can affect more joints and become more frequent (chronic gout). Gout can also cause uric acid to build up under your skin and inside your kidneys. What are the causes? This condition is caused by too much uric acid in your blood. This can occur because:  Your kidneys do not remove enough uric acid from your blood. This is the most common cause.  Your body makes too much uric acid. This  can occur with some cancers and cancer treatments. It can also occur if your body is breaking down too many red blood cells (hemolytic anemia).  You eat too many foods that are high in purines. These foods include organ meats and some seafood. Alcohol, especially beer, is also high in purines.  A gout attack may be triggered by trauma or stress. What increases the risk? This condition is more likely to develop in people who:  Have a family history of gout.  Are male and middle-aged.  Are male and have gone through menopause.  Are obese.  Frequently drink alcohol, especially beer.  Are dehydrated.  Lose weight too quickly.  Have an organ transplant.  Have lead poisoning.  Take certain medicines, including aspirin, cyclosporine, diuretics, levodopa, and niacin.  Have kidney disease or psoriasis.  What are the signs or symptoms? An attack of acute gout happens quickly. It usually occurs in just one joint. The most common place is the big toe. Attacks often start at night. Other joints that may be affected include joints of the feet, ankle, knee, fingers, wrist, or elbow. Symptoms may include:  Severe pain.  Warmth.  Swelling.  Stiffness.  Tenderness. The affected joint may be very painful to touch.  Shiny, red, or purple skin.  Chills and fever.  Chronic gout may cause symptoms more frequently. More joints may be involved. You may also have white or yellow lumps (tophi) on your hands or feet or in other areas near your joints. How is this diagnosed? This condition is diagnosed based on your symptoms, medical history, and physical exam. You may have tests, such as:  Blood tests to measure uric acid levels.  Removal of joint fluid with a needle (aspiration) to look for uric acid crystals.  X-rays to look for joint damage.  How is this treated? Treatment for this condition has two phases: treating an acute attack and preventing future attacks. Acute gout  treatment may include medicines to reduce pain and swelling, including:  NSAIDs.  Steroids. These are strong anti-inflammatory medicines that can be taken by mouth (orally) or injected into a joint.  Colchicine. This medicine relieves pain and swelling when it is taken soon after an attack. It can be given orally or through an IV tube.  Preventive treatment may include:  Daily use of smaller doses of NSAIDs or colchicine.  Use of a medicine that reduces uric acid levels in your blood.  Changes to your diet. You may need to see a specialist about healthy eating (dietitian).  Follow these instructions at home: During a Gout Attack  If directed, apply ice to the affected area: ? Put ice in a plastic bag. ? Place a towel between your skin and the bag. ? Leave the ice on for 20 minutes, 2-3 times a day.  Rest the joint as much as possible. If the affected joint is in your leg, you may be given crutches to use.  Raise (elevate) the affected joint above the level of your heart as often as possible.  Drink enough fluids to keep your urine clear or pale yellow.  Take over-the-counter and prescription medicines only as told by your health care provider.  Do not drive or operate heavy machinery while taking prescription pain medicine.  Follow instructions from your health care provider about eating or drinking restrictions.  Return to your normal activities as told by your health care provider. Ask your health care provider what activities are safe for you. Avoiding Future Gout Attacks  Follow a low-purine diet as told by your dietitian or health care provider. Avoid foods and drinks that are high in purines, including liver, kidney, anchovies, asparagus, herring, mushrooms, mussels, and beer.  Limit alcohol intake to no more than 1 drink a day for nonpregnant women and 2 drinks a day for men. One drink equals 12 oz of beer, 5 oz of wine, or 1 oz of hard liquor.  Maintain a healthy  weight or lose weight if you are overweight. If you want to lose weight, talk with your health care provider. It is important that you do not lose weight too quickly.  Start or maintain an exercise program as told by your health care provider.  Drink enough fluids to keep your urine clear or pale yellow.  Take over-the-counter and prescription medicines only as told by your health care provider.  Keep all follow-up visits as told by your health care provider. This is important. Contact a health care provider if:  You have another gout attack.  You continue to have symptoms of a gout attack after10 days of treatment.  You have side effects from your medicines.  You have chills or a fever.  You have burning pain when you urinate.  You have pain in your lower back or belly. Get help right away if:  You have severe or uncontrolled pain.  You cannot urinate. This information is not intended to replace advice given to you by your health care provider. Make sure you discuss any questions you have with your health care provider. Document Released: 12/09/2000 Document Revised: 05/19/2016 Document Reviewed: 09/24/2015 Elsevier Interactive Patient Education  2017 [employer].   IF you received an x-ray today, you will receive an invoice from [facility]. Please contact [facility] at [phone] with questions or concerns regarding your invoice.   IF you received labwork today, you will receive an invoice from [place]. Please contact [facility] at [phone] with questions or concerns regarding your invoice.   Our billing  staff will not be able to assist you with questions regarding bills from these companies.  You will be contacted with the lab results as soon as they are available. The fastest way to get your results is to activate your My Chart account. Instructions are located on the last page of this paperwork. If you have not heard from [place] regarding the results  in 2 weeks, please contact this office.      I personally performed the services described in this documentation, which was scribed in my presence. The recorded information has been reviewed and considered for accuracy and completeness, addended by me as needed, and agree with information above.  Signed,   Merri Ray, MD Primary Care at [place].  12/05/17 11:42 AM

## 2018-05-10 ENCOUNTER — Other Ambulatory Visit: Payer: Self-pay | Admitting: Physician Assistant

## 2018-05-10 DIAGNOSIS — I10 Essential (primary) hypertension: Secondary | ICD-10-CM

## 2018-05-21 ENCOUNTER — Encounter: Payer: Self-pay | Admitting: Family Medicine

## 2018-07-04 ENCOUNTER — Other Ambulatory Visit: Payer: Self-pay

## 2018-07-04 ENCOUNTER — Encounter: Payer: Self-pay | Admitting: Family Medicine

## 2018-07-04 ENCOUNTER — Ambulatory Visit: Payer: Managed Care, Other (non HMO) | Admitting: Family Medicine

## 2018-07-04 VITALS — BP 160/90 | HR 76 | Temp 98.1°F | Resp 18 | Ht 71.0 in | Wt 225.0 lb

## 2018-07-04 DIAGNOSIS — N529 Male erectile dysfunction, unspecified: Secondary | ICD-10-CM | POA: Diagnosis not present

## 2018-07-04 DIAGNOSIS — E782 Mixed hyperlipidemia: Secondary | ICD-10-CM | POA: Diagnosis not present

## 2018-07-04 DIAGNOSIS — I10 Essential (primary) hypertension: Secondary | ICD-10-CM | POA: Diagnosis not present

## 2018-07-04 DIAGNOSIS — B360 Pityriasis versicolor: Secondary | ICD-10-CM

## 2018-07-04 MED ORDER — TADALAFIL 10 MG PO TABS: 10.0000 mg | ORAL_TABLET | Freq: Every day | ORAL | 5 refills | Status: DC | PRN

## 2018-07-04 MED ORDER — AMLODIPINE BESYLATE 10 MG PO TABS: 10.0000 mg | ORAL_TABLET | Freq: Every day | ORAL | 1 refills | Status: DC

## 2018-07-04 MED ORDER — ATORVASTATIN CALCIUM 20 MG PO TABS: 20.0000 mg | ORAL_TABLET | Freq: Every day | ORAL | 1 refills | Status: DC

## 2018-07-04 MED ORDER — KETOCONAZOLE 2 % EX SHAM: 1.0000 "application " | MEDICATED_SHAMPOO | CUTANEOUS | 0 refills | Status: DC

## 2018-07-04 NOTE — Progress Notes (Signed)
Chief Complaint  Patient presents with  . Hypertension    has been spiking to 200/100   . Rash    gets it every summer     HPI  Uncontrolled Hypertension  Pt reports that he would donate plasma regularly and had bp that were 130/80s He states that he has not taking any bp meds  Last donation of plasma was 2 weeks ago  He states that he feel like his bp has been increasing BP Readings from Last 3 Encounters:  07/04/18 (!) 160/90  12/05/17 128/82  09/06/17 120/79   He has not taken any bp meds since December 2018 He was laid off his job and lost his insurance after a month and could not afford meds  He started creatinine for 4-5 days to help with his workout He reports that he has been having facial flushing and some sweats Denies chest pains  Tinea Versicolor He has tinea versicolor He reports that he typically gets worse in the summer No sick contacts No recent travel No diarrhea, itchy eyes, throat or ears, no asthma  Dyslipidemia Pt reports that he was out of his lipid medication He tried to maintain his diet and would make very healthy choices He has a family history of dyslipidemia He denies chest pains, claudication, muscle cramps   Past Medical History:  Diagnosis Date  . Acute meniscal tear of knee RIGHT KNEE  . Anxiety   . Arthritis RIGHT KNEE  . Bulging lumbar disc L5 - S1  . Depression   . Hemochromatosis   . Hyperlipidemia   . Hypertension     Current Outpatient Medications  Medication Sig Dispense Refill  . amLODipine (NORVASC) 10 MG tablet Take 1 tablet (10 mg total) by mouth daily. 30 tablet 1  . atorvastatin (LIPITOR) 20 MG tablet Take 1 tablet (20 mg total) by mouth daily. 90 tablet 1  . buPROPion (WELLBUTRIN XL) 300 MG 24 hr tablet Take 300 mg by mouth daily.    . ChlordiazePOXIDE HCl (LIBRIUM PO) Take 50 mg by mouth.    Marland Kitchen ketoconazole (NIZORAL) 2 % shampoo Apply 1 application topically 2 (two) times a week. 120 mL 0  . naltrexone (DEPADE)  50 MG tablet Take by mouth daily.    . tadalafil (CIALIS) 10 MG tablet Take 1 tablet (10 mg total) by mouth daily as needed for erectile dysfunction. 10 tablet 5   No current facility-administered medications for this visit.     Allergies:  Allergies  Allergen Reactions  . Ace Inhibitors Other (See Comments)    ANGIOEDEMA OF JOINTS- SEVERE    Past Surgical History:  Procedure Laterality Date  . KNEE ARTHROSCOPY  AGE 80 (APPROX)   RIGHT KNEE  . KNEE ARTHROSCOPY  07/13/2012   Procedure: ARTHROSCOPY KNEE;  Surgeon: Magnus Sinning, MD;  Location: [place];  Service: Orthopedics;  Laterality: Right;  WITH PARTIAL MEDIAL MENISECTOMY AND REMOVAL OF LOOSE BODIES  . torn rotator cuff      Social History   Socioeconomic History  . Marital status: Legally Separated    Spouse name: Not on file  . Number of children: Not on file  . Years of education: BA  . Highest education level: Not on file  Occupational History  . 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 Financial resource strain: Not on file  . Food insecurity:    Worry: Not on file    Inability: Not on file  . Transportation  needs:    Medical: Not on file    Non-medical: Not on file  Tobacco Use  . Smoking status: Never Smoker  . Smokeless tobacco: Never Used  Substance and Sexual Activity  . Alcohol use: Yes    Comment: 4 beers a month  . Drug use: No  . Sexual activity: Yes  Lifestyle  . Physical activity:    Days per week: Not on file    Minutes per session: Not on file  . Stress: Not on file  Relationships  . Social connections:    Talks on phone: Not on file    Gets together: Not on file    Attends religious service: Not on file    Active member of club or organization: Not on file    Attends meetings of clubs or organizations: Not on file    Relationship status: Not on file  Other Topics Concern  . Not on file  Social History Narrative   Lives at home alone    Separated, 2 step-daughters   Right-handed   Caffeine: 2 diet Cokes per day    Family History  Problem Relation Age of Onset  . Cancer Mother   . Diabetes Father   . Heart disease Father   . Hyperlipidemia Father   . Hypertension Father   . Kidney disease Father   . Colon cancer Neg Hx      ROS Review of Systems See HPI Constitution: No fevers or chills No malaise No diaphoresis Skin: No rash or itching Eyes: no blurry vision, no double vision GU: no dysuria or hematuria Neuro: no dizziness or headaches all others reviewed and negative   Objective: Vitals:   07/04/18 1655  BP: (!) 160/90  Pulse: 76  Resp: 18  Temp: 98.1 F (36.7 C)  TempSrc: Oral  SpO2: 98%  Weight: 225 lb (102.1 kg)  Height: 5\' 11"  (1.803 m)    Physical Exam Skin: hyperpigmentated patchy lesions on torso    Assessment and Plan Blake Roberts was seen today for hypertension and rash.  Diagnoses and all orders for this visit:  Uncontrolled hypertension- restarted bp meds Pt to follow up with his PCP in one month to reassess bp control Will check for renal function changes -     Comprehensive metabolic panel -     Microalbumin, urine -     Lipid panel -     CBC -     EKG 12-Lead -     amLODipine (NORVASC) 10 MG tablet; Take 1 tablet (10 mg total) by mouth daily.  Tinea versicolor- discussed topical antifungal.  -     ketoconazole (NIZORAL) 2 % shampoo; Apply 1 application topically 2 (two) times a week.  Mixed hyperlipidemia- likely levels will be high since pt had been off his medications.  Discussed dietary changes -     atorvastatin (LIPITOR) 20 MG tablet; Take 1 tablet (20 mg total) by mouth daily. -     Lipid panel; Future  Erectile dysfunction, unspecified erectile dysfunction type -     tadalafil (CIALIS) 10 MG tablet; Take 1 tablet (10 mg total) by mouth daily as needed for erectile dysfunction.  Hemochromatosis, unspecified hemochromatosis type - denies abdominal pains and seems  to be doing well based on skin pigment Will check CBC -     CBC     Blake Roberts

## 2018-07-04 NOTE — Patient Instructions (Addendum)
For the ketoconazole -  Shampoo 2%: Apply to affected area of damp skin, lather, leave on 5 minutes, and rinse (one application is usually sufficient)  Restart your amlodipine 10mg  today Return to clinic in 2 weeks for blood pressure follow up with Dr. Carlota Raspberry Stop the Creatinine supplement    IF you received an x-ray today, you will receive an invoice from [facility]. Please contact [facility] at [phone] with questions or concerns regarding your invoice.   IF you received labwork today, you will receive an invoice from [place]. Please contact [facility] at [phone] with questions or concerns regarding your invoice.   Our billing staff will not be able to assist you with questions regarding bills from these companies.  You will be contacted with the lab results as soon as they are available. The fastest way to get your results is to activate your My Chart account. Instructions are located on the last page of this paperwork. If you have not heard from [place] regarding the results in 2 weeks, please contact this office.     Heart Disease Prevention Heart disease is a leading cause of death. There are many things you can do to help prevent heart disease. Be physically active Physical activity is good for your heart. It helps control your blood pressure, cholesterol levels, and weight. Try to be physically active every day. Ask your health care provider what activities are best for you. Be a healthy weight Extra weight can strain your heart and affect your blood pressure and cholesterol levels. Lose weight with diet and exercise if recommended by your health care provider. Eat heart-healthy foods Follow a healthy eating plan as recommended by your health care provider or dietitian. Heart-healthy foods include:  High-fiber foods. These include oat bran, oatmeal, and whole-grain breads and cereals.  Fruits and vegetables.  Avoid:  Alcohol.  Fried foods.  Foods  high in saturated fat. These include meats, butter, whole dairy products, shortening, and coconut or palm oil.  Salty foods. These include canned food, luncheon meat, salty snacks, and fast food.  Keep your cholesterol levels under control Cholesterol is a substance that is used for many important functions. When your cholesterol levels are high, cholesterol can stick to the insides of your blood vessels, making them narrow or clog. This can lead to chest pain (angina) and a heart attack. Keep your cholesterol levels under control as recommended by your health care provider. Have your cholesterol checked at least once a year. Target cholesterol levels (in mg/dL) for most people are:  Total cholesterol below 200.  LDL cholesterol below 100.  HDL cholesterol above 40 in men and above 50 in women.  Triglycerides below 150.  Keep your blood pressure under control Having high blood pressure (hypertension) puts you at risk for stroke and other forms of heart disease. Keep your blood pressure under control as recommended by your health care provider. Ask your health care provider if you need treatment to lower your blood pressure. If you are 10-30 years of age, have your blood pressure checked every 3-5 years. If you are 74 years of age or older, have your blood pressure checked every year. Do not use tobacco products Tobacco smoke can damage your heart and blood vessels. Do not use any tobacco products including cigarettes, chewing tobacco, or electronic cigarettes. If you need help quitting, ask your health care provider. Take medicines as directed Take medicines only as directed by your health care provider. Ask your health care provider  whether you should take an aspirin every day. Taking aspirin can help reduce your risk of heart disease and stroke. Where to find more information: To find out more about heart disease, visit the American Heart Association's website at www.americanheart.org This  information is not intended to replace advice given to you by your health care provider. Make sure you discuss any questions you have with your health care provider. Document Released: 07/26/2004 Document Revised: 05/11/2016 Document Reviewed: 02/05/2014 Elsevier Interactive Patient Education  [employer].

## 2018-07-05 LAB — COMPREHENSIVE METABOLIC PANEL
A/G RATIO: 1.8 (ref 1.2–2.2)
ALBUMIN: 4.8 g/dL (ref 3.5–5.5)
ALT: 20 IU/L (ref 0–44)
AST: 20 IU/L (ref 0–40)
Alkaline Phosphatase: 62 IU/L (ref 39–117)
BUN/Creatinine Ratio: 14 (ref 9–20)
BUN: 18 mg/dL (ref 6–24)
Bilirubin Total: 0.4 mg/dL (ref 0.0–1.2)
CALCIUM: 10.2 mg/dL (ref 8.7–10.2)
CO2: 22 mmol/L (ref 20–29)
Chloride: 100 mmol/L (ref 96–106)
Creatinine, Ser: 1.27 mg/dL (ref 0.76–1.27)
GFR, EST AFRICAN AMERICAN: 75 mL/min/{1.73_m2} (ref >59)
GFR, EST NON AFRICAN AMERICAN: 65 mL/min/{1.73_m2} (ref >59)
GLOBULIN, TOTAL: 2.6 g/dL (ref 1.5–4.5)
Glucose: 109 mg/dL — ABNORMAL HIGH (ref 65–99)
POTASSIUM: 4.7 mmol/L (ref 3.5–5.2)
Sodium: 138 mmol/L (ref 134–144)
TOTAL PROTEIN: 7.4 g/dL (ref 6.0–8.5)

## 2018-07-05 LAB — LIPID PANEL
CHOL/HDL RATIO: 5.2 ratio — AB (ref 0.0–5.0)
Cholesterol, Total: 248 mg/dL — ABNORMAL HIGH (ref 100–199)
HDL: 48 mg/dL (ref >39)
LDL CALC: 121 mg/dL — AB (ref 0–99)
TRIGLYCERIDES: 394 mg/dL — AB (ref 0–149)
VLDL Cholesterol Cal: 79 mg/dL — ABNORMAL HIGH (ref 5–40)

## 2018-07-05 LAB — CBC
HEMOGLOBIN: 15 g/dL (ref 13.0–17.7)
Hematocrit: 42.9 % (ref 37.5–51.0)
MCH: 32.1 pg (ref 26.6–33.0)
MCHC: 35 g/dL (ref 31.5–35.7)
MCV: 92 fL (ref 79–97)
PLATELETS: 237 10*3/uL (ref 150–450)
RBC: 4.67 x10E6/uL (ref 4.14–5.80)
RDW: 13.7 % (ref 12.3–15.4)
WBC: 7.4 10*3/uL (ref 3.4–10.8)

## 2018-07-05 LAB — MICROALBUMIN, URINE: Microalbumin, Urine: <3 ug/mL

## 2018-07-24 ENCOUNTER — Ambulatory Visit (INDEPENDENT_AMBULATORY_CARE_PROVIDER_SITE_OTHER): Payer: Managed Care, Other (non HMO)

## 2018-07-24 ENCOUNTER — Other Ambulatory Visit: Payer: Self-pay

## 2018-07-24 ENCOUNTER — Ambulatory Visit: Payer: Managed Care, Other (non HMO) | Admitting: Family Medicine

## 2018-07-24 ENCOUNTER — Encounter: Payer: Self-pay | Admitting: Family Medicine

## 2018-07-24 VITALS — BP 130/80 | HR 80 | Temp 98.4°F | Ht 71.0 in | Wt 226.4 lb

## 2018-07-24 DIAGNOSIS — M25512 Pain in left shoulder: Secondary | ICD-10-CM

## 2018-07-24 DIAGNOSIS — E782 Mixed hyperlipidemia: Secondary | ICD-10-CM

## 2018-07-24 DIAGNOSIS — I10 Essential (primary) hypertension: Secondary | ICD-10-CM

## 2018-07-24 DIAGNOSIS — R7303 Prediabetes: Secondary | ICD-10-CM

## 2018-07-24 DIAGNOSIS — M791 Myalgia, unspecified site: Secondary | ICD-10-CM

## 2018-07-24 DIAGNOSIS — M109 Gout, unspecified: Secondary | ICD-10-CM

## 2018-07-24 NOTE — Progress Notes (Signed)
Subjective:  By signing my name below, I, Delton Coombes, attest that this documentation has been prepared under the direction and in the presence of Wendie Agreste, MD Electronically Signed: Delton Coombes Medical Scribe 07/24/2018 at 2:47 PM   Patient ID: [name], male    DOB: 1966/05/24, 52 y.o.   MRN: [id number] Chief Complaint  Patient presents with  . Blood Pressure Check  . left shoulder pain    worse today. going on a week to 10 days    HPI KAREN KINNARD is a 52 y.o. male who presents to Primary Care at [facility] for follow up of hypertension. He has history of alcohol  abuse in the past, had improved with relapsed when discussed in December, and when meeting with psychiatrist.   Gouts Treated with flare in December, with Colchicine and indomethacin if needed.  No results found for: [facility] He denies taking medication for gout.  Hypertension BP Readings from Last 3 Encounters:  07/24/18 130/80  07/04/18 (!) 160/90  12/05/17 128/82   Lab Results  Component Value Date   CREATININE 1.27 07/04/2018   Patient takes amlodipine 10 mg QD. He was seen twenty days ago by Dr. Nolon Rod, he had been off medication since December, due to cost of medication without insurance.  He was started on Norvasc at that time. Patient reports he is taking amlodipine again, and denies any issues with medication. Patient reports that he last drank an alcoholic beverage in December.  Hyperlipidemia Restarted on Lipitor last visit. Lab Results  Component Value Date   CHOL 248 (H) 07/04/2018   HDL 48 07/04/2018   LDLCALC 121 (H) 07/04/2018   TRIG 394 (H) 07/04/2018   CHOLHDL 5.2 (H) 07/04/2018   Lab Results  Component Value Date   ALT 20 07/04/2018   AST 20 07/04/2018   ALKPHOS 62 07/04/2018   BILITOT 0.4 07/04/2018   Labs were off medications. Patient reports that he is trying to get off processed foods.  Tolerating Lipitor at current dose.  Left shoulder pain Patient reports  that pain feels similar to previous right shoulder. He reports having an acute tendon tear in Summer 2015. He denies having any injuries in the past ten days. 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 He reports that pain is worse then a muscle soreness.     Hyperglycemia Noted in recent labs.  A1c at prediabetes level in May Wt Readings from Last 3 Encounters:  07/24/18 226 lb 6.4 oz (102.7 kg)  07/04/18 225 lb (102.1 kg)  12/05/17 219 lb 12.8 oz (99.7 kg)    Lab Results  Component Value Date   HGBA1C 5.8 (H) 05/10/2017     Patient Active Problem List   Diagnosis Date Noted  . Hyperlipidemia 05/10/2017  . Erectile dysfunction 05/10/2017  . Memory difficulty 09/26/2016  . Hemochromatosis 07/11/2016  . Episodic lightheadedness 12/25/2015  . Anemia, unspecified 12/25/2015  . Chronic left-sided low back pain with sciatica 07/15/2015  . Essential hypertension 02/15/2015  . Rotator cuff tear 06/09/2014  . GERD (gastroesophageal reflux disease) 05/13/2014   Past Medical History:  Diagnosis Date  . Acute meniscal tear of knee RIGHT KNEE  . Anxiety   . Arthritis RIGHT KNEE  . Bulging lumbar disc L5 - S1  . Depression   . Hemochromatosis   . Hyperlipidemia   . Hypertension    Past Surgical History:  Procedure Laterality  Date  . KNEE ARTHROSCOPY  AGE 26 (APPROX)   RIGHT KNEE  . KNEE ARTHROSCOPY  07/13/2012   Procedure: ARTHROSCOPY KNEE;  Surgeon: Magnus Sinning, MD;  Location: [facility];  Service: Orthopedics;  Laterality: Right;  WITH PARTIAL MEDIAL MENISECTOMY AND REMOVAL OF LOOSE BODIES  . torn rotator cuff     Allergies  Allergen Reactions  . Ace Inhibitors Other (See Comments)    ANGIOEDEMA OF JOINTS- SEVERE   Prior to Admission medications   Medication Sig Start Date End Date Taking? Authorizing Provider  amLODipine (NORVASC) 10 MG tablet Take 1 tablet (10 mg total) by mouth  daily. 07/04/18  Yes Stallings, Zoe A, MD  atorvastatin (LIPITOR) 20 MG tablet Take 1 tablet (20 mg total) by mouth daily. 07/04/18  Yes Stallings, Zoe A, MD  ketoconazole (NIZORAL) 2 % shampoo Apply 1 application topically 2 (two) times a week. 07/05/18  Yes Delia Chimes A, MD  tadalafil (CIALIS) 10 MG tablet Take 1 tablet (10 mg total) by mouth daily as needed for erectile dysfunction. 07/04/18  Yes Forrest Moron, MD   Social History   Socioeconomic History  . Marital status: Legally Separated    Spouse name: Not on file  . Number of children: Not on file  . Years of education: BA  . Highest education level: Not on file  Occupational History  . 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 Financial resource strain: Not on file  . Food insecurity:    Worry: Not on file    Inability: Not on file  . Transportation needs:    Medical: Not on file    Non-medical: Not on file  Tobacco Use  . Smoking status: Never Smoker  . Smokeless tobacco: Never Used  Substance and Sexual Activity  . Alcohol use: Yes    Comment: 4 beers a month  . Drug use: No  . Sexual activity: Yes  Lifestyle  . Physical activity:    Days per week: Not on file    Minutes per session: Not on file  . Stress: Not on file  Relationships  . Social connections:    Talks on phone: Not on file    Gets together: Not on file    Attends religious service: Not on file    Active member of club or organization: Not on file    Attends meetings of clubs or organizations: Not on file    Relationship status: Not on file  . Intimate partner violence:    Fear of current or ex partner: Not on file    Emotionally abused: Not on file    Physically abused: Not on file    Forced sexual activity: Not on file  Other Topics Concern  . Not on file  Social History Narrative   Lives at home alone   Separated, 2 step-daughters   Right-handed   Caffeine: 2 diet Cokes per day    Review of Systems     Objective:     Physical Exam  Constitutional: He is oriented to person, place, and time. He appears well-developed and well-nourished.  Eyes: Pupils are equal, round, and reactive to light. EOM are normal.  Neck: Normal range of motion.  Cardiovascular: Normal rate, regular rhythm, normal heart sounds and intact distal pulses.  Pulmonary/Chest: Effort normal and breath sounds normal.  Neurological: He is alert and oriented to person, place, and time.  Psychiatric: He has a normal mood and affect. His behavior  is normal. Judgment and thought content normal.   Vitals:   07/24/18 1348  BP: 130/80  Pulse: 80  Temp: 98.4 F (36.9 C)  TempSrc: Oral  SpO2: 95%  Weight: 226 lb 6.4 oz (102.7 kg)  Height: 5\' 11"  (1.803 m)   He complains of left shoulder pain with abduction and external rotation ,but has Full AROM in left shoulder. [place], AC, lavicle are nontender, deltoid is non tender. Left shoulder has a negative drop arm, minimal painwith empty can but no weakness. Slight discomfort with crossover. Full RTC strength. Negative NEER, negative hawkins   Dg Shoulder Left  Result Date: 07/24/2018 CLINICAL DATA:  Left shoulder pain for 10 days EXAM: LEFT SHOULDER - 2+ VIEW COMPARISON:  07/20/2007 right shoulder series FINDINGS: Degenerative spurring at the [facility] joint projecting superiorly. No fracture, glenohumeral narrowing, or soft tissue calcification. IMPRESSION: No acute finding. Degenerative spurring at the acromioclavicular joint. Electronically Signed   By: Monte Fantasia M.D.   On: 07/24/2018 15:05      Assessment & Plan:   SWAYZE PRIES is a 52 y.o. male Essential hypertension  -Improving on meds.  Continue same regimen.  Continue to decrease sodium and diet changes.  Recheck 2 weeks for other issues below.  Prediabetes - Plan: Hemoglobin A1c  -Repeat A1c, diet and exercise discussed for continued weight management.  Mixed hyperlipidemia  -Tolerating statin, plan on fasting labs in 6  weeks.  Gout, unspecified cause, unspecified chronicity, unspecified site - Plan: Uric Acid  -Recently controlled, check uric acid, if flares would consider daily preventative  Acute pain of left shoulder - Plan: DG Shoulder Left, CK Myalgia - Plan: CK  -Unlikely due to statin, but will check CPK.  Possible rotator cuff tendinitis/overuse.  Recommend initial activity modification, range of motion as tolerated as well as normal routine at work but avoid extra upper arm/upper body workouts for now.  Recheck 2 weeks  No orders of the defined types were placed in this encounter.  Patient Instructions   Blood pressure looks better today. No change in meds for now. Continue to work on cutting back on sodium in diet.  Keep up the good work with avoiding alcohol.   No change in lipitor dose for now. Return for repeat labs in next 4-6 weeks.  Follow up in 3 months for both blood pressure and cholesterol to decide if you still need to be on a statin (labs looked better last year).   I will recheck gout test, but if any gout flares, return to discuss treatment.   Blood counts looked ok last visit. Can recheck CBC in next 6 months to make sure hemoglobin is stable, and possible iron tests.   For shoulder pain, I would recommend relative rest and avoidance of additional exercise to that shoulder in addition to required movements at work.  I will check a muscle enzyme test, but unlikely due to statin.   Recheck in next 2 weeks. Return to the clinic or go to the nearest emergency room if any of your symptoms worsen or new symptoms occur.   Rotator Cuff Tendinitis Rotator cuff tendinitis is inflammation of the tough, cord-like bands that connect muscle to bone (tendons) in the rotator cuff. The rotator cuff includes all of the muscles and tendons that connect the arm to the shoulder. The rotator cuff holds the head of the upper arm bone (humerus) in the cup (fossa) of the shoulder blade (scapula). This  condition can lead to a long-lasting (  chronic) tear. The tear may be partial or complete. What are the causes? This condition is usually caused by overusing the rotator cuff. What increases the risk? This condition is more likely to develop in athletes and workers who frequently use their shoulder or reach over their heads. This can include activities such as:  Tennis.  Baseball or softball.  Swimming.  Construction work.  Painting.  What are the signs or symptoms? Symptoms of this condition include:  Pain spreading (radiating) from the shoulder to the upper arm.  Swelling and tenderness in front of the shoulder.  Pain when reaching, pulling, or lifting the arm above the head.  Pain when lowering the arm from above the head.  Minor pain in the shoulder when resting.  Increased pain in the shoulder at night.  Difficulty placing the arm behind the back.  How is this diagnosed? This condition is diagnosed with a medical history and physical exam. Tests may also be done, including:  X-rays.  MRI.  Ultrasounds.  CT or MR arthrogram. During this test, a contrast material is injected and then images are taken.  How is this treated? Treatment for this condition depends on the severity of the condition. In less severe cases, treatment may include:  Rest. This may be done with a sling that holds the shoulder still (immobilization). Your health care provider may also recommend avoiding activities that involve lifting your arm over your head.  Icing the shoulder.  Anti-inflammatory medicines, such as aspirin or ibuprofen.  In more severe cases, treatment may include:  Physical therapy.  Steroid injections.  Surgery.  Follow these instructions at home: If you have a sling:  Wear the sling as told by your health care provider. Remove it only as told by your health care provider.  Loosen the sling if your fingers tingle, become numb, or turn cold and blue.  Keep  the sling clean.  If the sling is not waterproof, do not let it get wet. Remove it, if allowed, or cover it with a watertight covering when you take a bath or shower. Managing pain, stiffness, and swelling  If directed, put ice on the injured area. ? If you have a removable sling, remove it as told by your health care provider. ? Put ice in a plastic bag. ? Place a towel between your skin and the bag. ? Leave the ice on for 20 minutes, 2-3 times a day.  Move your fingers often to avoid stiffness and to lessen swelling.  Raise (elevate) the injured area above the level of your heart while you are lying down.  Find a comfortable sleeping position or sleep on a recliner, if available. Driving  Do not drive or use heavy machinery while taking prescription pain medicine.  Ask your health care provider when it is safe to drive if you have a sling on your arm. Activity  Rest your shoulder as told by your health care provider.  Return to your normal activities as told by your health care provider. Ask your health care provider what activities are safe for you.  Do any exercises or stretches as told by your health care provider.  If you do repetitive overhead tasks, take small breaks in between and include stretching exercises as told by your health care provider. General instructions  Do not use any products that contain nicotine or tobacco, such as cigarettes and e-cigarettes. These can delay healing. If you need help quitting, ask your health care provider.  Take over-the-counter  and prescription medicines only as told by your health care provider.  Keep all follow-up visits as told by your health care provider. This is important. Contact a health care provider if:  Your pain gets worse.  You have new pain in your arm, hands, or fingers.  Your pain is not relieved with medicine or does not get better after 6 weeks of treatment.  You have cracking sensations when moving your  shoulder in certain directions.  You hear a snapping sound after using your shoulder, followed by severe pain and weakness. Get help right away if:  Your arm, hand, or fingers are numb or tingling.  Your arm, hand, or fingers are swollen or painful or they turn white or blue. Summary  Rotator cuff tendinitis is inflammation of the tough, cord-like bands that connect muscle to bone (tendons) in the rotator cuff.  This condition is usually caused by overusing the rotator cuff, which includes all of the muscles and tendons that connect the arm to the shoulder.  This condition is more likely to develop in athletes and workers who frequently use their shoulder or reach over their heads.  Treatment generally includes rest, anti-inflammatory medicines, and icing. In some cases, physical therapy and steroid injections may be needed. In severe cases, surgery may be needed. This information is not intended to replace advice given to you by your health care provider. Make sure you discuss any questions you have with your health care provider. Document Released: 03/03/2004 Document Revised: 11/28/2016 Document Reviewed: 11/28/2016 Elsevier Interactive Patient Education  2017 [place].  Shoulder Pain Many things can cause shoulder pain, including:  An injury to the area.  Overuse of the shoulder.  Arthritis.  The source of the pain can be:  Inflammation.  An injury to the shoulder joint.  An injury to a tendon, ligament, or bone.  Follow these instructions at home: Take these actions to help with your pain:  Squeeze a soft ball or a foam pad as much as possible. This helps to keep the shoulder from swelling. It also helps to strengthen the arm.  Take over-the-counter and prescription medicines only as told by your health care provider.  If directed, apply ice to the area: ? Put ice in a plastic bag. ? Place a towel between your skin and the bag. ? Leave the ice on for 20  minutes, 2-3 times per day. Stop applying ice if it does not help with the pain.  If you were given a shoulder sling or immobilizer: ? Wear it as told. ? Remove it to shower or bathe. ? Move your arm as little as possible, but keep your hand moving to prevent swelling.  Contact a health care provider if:  Your pain gets worse.  Your pain is not relieved with medicines.  New pain develops in your arm, hand, or fingers. Get help right away if:  Your arm, hand, or fingers: ? Tingle. ? Become numb. ? Become swollen. ? Become painful. ? Turn white or blue. This information is not intended to replace advice given to you by your health care provider. Make sure you discuss any questions you have with your health care provider. Document Released: 09/21/2005 Document Revised: 08/07/2016 Document Reviewed: 04/06/2015 Elsevier Interactive Patient Education  2018 [employer].    IF you received an x-ray today, you will receive an invoice from [facility]. Please contact [facility] at [phone] with questions or concerns regarding your invoice.   IF you received labwork  today, you will receive an invoice from [facility]. Please contact [facility] at [phone] with questions or concerns regarding your invoice.   Our billing staff will not be able to assist you with questions regarding bills from these companies.  You will be contacted with the lab results as soon as they are available. The fastest way to get your results is to activate your My Chart account. Instructions are located on the last page of this paperwork. If you have not heard from [place] regarding the results in 2 weeks, please contact this office.       I personally performed the services described in this documentation, which was scribed in my presence. The recorded information has been reviewed and considered for accuracy and completeness, addended by me as needed, and agree with information  above.  Signed,   Merri Ray, MD [facility] at [place].  07/27/18 1:09 PM

## 2018-07-24 NOTE — Patient Instructions (Addendum)
Blood pressure looks better today. No change in meds for now. Continue to work on cutting back on sodium in diet.  Keep up the good work with avoiding alcohol.   No change in lipitor dose for now. Return for repeat labs in next 4-6 weeks.  Follow up in 3 months for both blood pressure and cholesterol to decide if you still need to be on a statin (labs looked better last year).   I will recheck gout test, but if any gout flares, return to discuss treatment.   Blood counts looked ok last visit. Can recheck CBC in next 6 months to make sure hemoglobin is stable, and possible iron tests.   For shoulder pain, I would recommend relative rest and avoidance of additional exercise to that shoulder in addition to required movements at work.  I will check a muscle enzyme test, but unlikely due to statin.   Recheck in next 2 weeks. Return to the clinic or go to the nearest emergency room if any of your symptoms worsen or new symptoms occur.   Rotator Cuff Tendinitis Rotator cuff tendinitis is inflammation of the tough, cord-like bands that connect muscle to bone (tendons) in the rotator cuff. The rotator cuff includes all of the muscles and tendons that connect the arm to the shoulder. The rotator cuff holds the head of the upper arm bone (humerus) in the cup (fossa) of the shoulder blade (scapula). This condition can lead to a long-lasting (chronic) tear. The tear may be partial or complete. What are the causes? This condition is usually caused by overusing the rotator cuff. What increases the risk? This condition is more likely to develop in athletes and workers who frequently use their shoulder or reach over their heads. This can include activities such as:  Tennis.  Baseball or softball.  Swimming.  Construction work.  Painting.  What are the signs or symptoms? Symptoms of this condition include:  Pain spreading (radiating) from the shoulder to the upper arm.  Swelling and tenderness in  front of the shoulder.  Pain when reaching, pulling, or lifting the arm above the head.  Pain when lowering the arm from above the head.  Minor pain in the shoulder when resting.  Increased pain in the shoulder at night.  Difficulty placing the arm behind the back.  How is this diagnosed? This condition is diagnosed with a medical history and physical exam. Tests may also be done, including:  X-rays.  MRI.  Ultrasounds.  CT or MR arthrogram. During this test, a contrast material is injected and then images are taken.  How is this treated? Treatment for this condition depends on the severity of the condition. In less severe cases, treatment may include:  Rest. This may be done with a sling that holds the shoulder still (immobilization). Your health care provider may also recommend avoiding activities that involve lifting your arm over your head.  Icing the shoulder.  Anti-inflammatory medicines, such as aspirin or ibuprofen.  In more severe cases, treatment may include:  Physical therapy.  Steroid injections.  Surgery.  Follow these instructions at home: If you have a sling:  Wear the sling as told by your health care provider. Remove it only as told by your health care provider.  Loosen the sling if your fingers tingle, become numb, or turn cold and blue.  Keep the sling clean.  If the sling is not waterproof, do not let it get wet. Remove it, if allowed, or cover it with  a watertight covering when you take a bath or shower. Managing pain, stiffness, and swelling  If directed, put ice on the injured area. ? If you have a removable sling, remove it as told by your health care provider. ? Put ice in a plastic bag. ? Place a towel between your skin and the bag. ? Leave the ice on for 20 minutes, 2-3 times a day.  Move your fingers often to avoid stiffness and to lessen swelling.  Raise (elevate) the injured area above the level of your heart while you are  lying down.  Find a comfortable sleeping position or sleep on a recliner, if available. Driving  Do not drive or use heavy machinery while taking prescription pain medicine.  Ask your health care provider when it is safe to drive if you have a sling on your arm. Activity  Rest your shoulder as told by your health care provider.  Return to your normal activities as told by your health care provider. Ask your health care provider what activities are safe for you.  Do any exercises or stretches as told by your health care provider.  If you do repetitive overhead tasks, take small breaks in between and include stretching exercises as told by your health care provider. General instructions  Do not use any products that contain nicotine or tobacco, such as cigarettes and e-cigarettes. These can delay healing. If you need help quitting, ask your health care provider.  Take over-the-counter and prescription medicines only as told by your health care provider.  Keep all follow-up visits as told by your health care provider. This is important. Contact a health care provider if:  Your pain gets worse.  You have new pain in your arm, hands, or fingers.  Your pain is not relieved with medicine or does not get better after 6 weeks of treatment.  You have cracking sensations when moving your shoulder in certain directions.  You hear a snapping sound after using your shoulder, followed by severe pain and weakness. Get help right away if:  Your arm, hand, or fingers are numb or tingling.  Your arm, hand, or fingers are swollen or painful or they turn white or blue. Summary  Rotator cuff tendinitis is inflammation of the tough, cord-like bands that connect muscle to bone (tendons) in the rotator cuff.  This condition is usually caused by overusing the rotator cuff, which includes all of the muscles and tendons that connect the arm to the shoulder.  This condition is more likely to develop  in athletes and workers who frequently use their shoulder or reach over their heads.  Treatment generally includes rest, anti-inflammatory medicines, and icing. In some cases, physical therapy and steroid injections may be needed. In severe cases, surgery may be needed. This information is not intended to replace advice given to you by your health care provider. Make sure you discuss any questions you have with your health care provider. Document Released: 03/03/2004 Document Revised: 11/28/2016 Document Reviewed: 11/28/2016 Elsevier Interactive Patient Education  2017 [employer].  Shoulder Pain Many things can cause shoulder pain, including:  An injury to the area.  Overuse of the shoulder.  Arthritis.  The source of the pain can be:  Inflammation.  An injury to the shoulder joint.  An injury to a tendon, ligament, or bone.  Follow these instructions at home: Take these actions to help with your pain:  Squeeze a soft ball or a foam pad as much as possible. This helps  to keep the shoulder from swelling. It also helps to strengthen the arm.  Take over-the-counter and prescription medicines only as told by your health care provider.  If directed, apply ice to the area: ? Put ice in a plastic bag. ? Place a towel between your skin and the bag. ? Leave the ice on for 20 minutes, 2-3 times per day. Stop applying ice if it does not help with the pain.  If you were given a shoulder sling or immobilizer: ? Wear it as told. ? Remove it to shower or bathe. ? Move your arm as little as possible, but keep your hand moving to prevent swelling.  Contact a health care provider if:  Your pain gets worse.  Your pain is not relieved with medicines.  New pain develops in your arm, hand, or fingers. Get help right away if:  Your arm, hand, or fingers: ? Tingle. ? Become numb. ? Become swollen. ? Become painful. ? Turn white or blue. This information is not intended to replace  advice given to you by your health care provider. Make sure you discuss any questions you have with your health care provider. Document Released: 09/21/2005 Document Revised: 08/07/2016 Document Reviewed: 04/06/2015 Elsevier Interactive Patient Education  2018 [employer].    IF you received an x-ray today, you will receive an invoice from [facility]. Please contact [facility] at [phone] with questions or concerns regarding your invoice.   IF you received labwork today, you will receive an invoice from [facility]. Please contact [facility] at [phone] with questions or concerns regarding your invoice.   Our billing staff will not be able to assist you with questions regarding bills from these companies.  You will be contacted with the lab results as soon as they are available. The fastest way to get your results is to activate your My Chart account. Instructions are located on the last page of this paperwork. If you have not heard from [place] regarding the results in 2 weeks, please contact this office.

## 2018-07-25 LAB — HEMOGLOBIN A1C
Est. average glucose Bld gHb Est-mCnc: 117 mg/dL
Hgb A1c MFr Bld: 5.7 % — ABNORMAL HIGH (ref 4.8–5.6)

## 2018-07-25 LAB — CK: Total CK: 195 U/L (ref 24–204)

## 2018-07-25 LAB — URIC ACID: URIC ACID: 8.6 mg/dL (ref 3.7–8.6)

## 2018-10-25 ENCOUNTER — Other Ambulatory Visit: Payer: Self-pay | Admitting: Family Medicine

## 2018-10-25 DIAGNOSIS — I10 Essential (primary) hypertension: Secondary | ICD-10-CM

## 2018-11-07 ENCOUNTER — Ambulatory Visit (INDEPENDENT_AMBULATORY_CARE_PROVIDER_SITE_OTHER): Payer: Managed Care, Other (non HMO)

## 2018-11-07 ENCOUNTER — Encounter: Payer: Self-pay | Admitting: Family Medicine

## 2018-11-07 ENCOUNTER — Other Ambulatory Visit: Payer: Self-pay

## 2018-11-07 ENCOUNTER — Ambulatory Visit: Payer: Managed Care, Other (non HMO) | Admitting: Family Medicine

## 2018-11-07 VITALS — BP 161/92 | HR 83 | Temp 98.6°F | Ht 71.0 in | Wt 232.4 lb

## 2018-11-07 DIAGNOSIS — M25562 Pain in left knee: Secondary | ICD-10-CM | POA: Diagnosis not present

## 2018-11-07 DIAGNOSIS — M79652 Pain in left thigh: Secondary | ICD-10-CM | POA: Diagnosis not present

## 2018-11-07 MED ORDER — TRAMADOL HCL 50 MG PO TABS: 50.0000 mg | ORAL_TABLET | Freq: Three times a day (TID) | ORAL | 0 refills | Status: DC | PRN

## 2018-11-07 NOTE — Patient Instructions (Signed)
° ° ° °  If you have lab work done today you will be contacted with your lab results within the next 2 weeks.  If you have not heard from us then please contact us. The fastest way to get your results is to register for My Chart. ° ° °IF you received an x-ray today, you will receive an invoice from [facility]. Please contact [facility] at [phone] with questions or concerns regarding your invoice.  ° °IF you received labwork today, you will receive an invoice from [facility]. Please contact [facility] at [phone] with questions or concerns regarding your invoice.  ° °Our billing staff will not be able to assist you with questions regarding bills from these companies. ° °You will be contacted with the lab results as soon as they are available. The fastest way to get your results is to activate your My Chart account. Instructions are located on the last page of this paperwork. If you have not heard from us regarding the results in 2 weeks, please contact this office. °  ° ° ° °

## 2018-11-07 NOTE — Progress Notes (Signed)
11/13/201910:15 AM  Blake Roberts [birth year], 52 y.o. male [id number]  Chief Complaint  Patient presents with  . Pain    while exercising doing squats, heard the cracking sound in the right knee. Past surgery in the left knee 1990 and 2014. Pain is about a 4 today at rest. When moving pain is a 7. Taking advil for the pain. Medication took the edge off    HPI:   Patient is a 52 y.o. 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 0 No No No No     Depression screen [facility] 2/9 07/24/2018 07/04/2018 12/05/2017  Decreased Interest 0 0 0  Down, Depressed, Hopeless 0 0 0  PHQ - 2 Score 0 0 0    Allergies  Allergen Reactions  . Ace Inhibitors Other (See Comments)    ANGIOEDEMA OF JOINTS- SEVERE    Prior to Admission medications   Medication Sig Start Date End Date Taking? Authorizing Provider  amLODipine (NORVASC) 10 MG tablet TAKE 1 TABLET(10 MG) BY MOUTH DAILY 10/25/18  Yes Wendie Agreste, MD  atorvastatin (LIPITOR) 20 MG tablet Take 1 tablet (20 mg total) by mouth daily. 07/04/18  Yes Stallings, Zoe A, MD  ibuprofen (ADVIL,MOTRIN) 200 MG tablet Take 200 mg by mouth every 6 (six) hours as needed.   Yes [provider]  ketoconazole (NIZORAL) 2 % shampoo Apply 1 application topically 2 (two) times a week. 07/05/18  Yes Delia Chimes A, MD  tadalafil (CIALIS) 10 MG tablet Take 1 tablet (10 mg total) by mouth daily as needed for erectile dysfunction.  07/04/18  Yes Forrest Moron, MD    Past Medical History:  Diagnosis Date  . Acute meniscal tear of knee RIGHT KNEE  . Anxiety   . Arthritis RIGHT KNEE  . Bulging lumbar disc L5 - S1  . Depression   . Hemochromatosis   . Hyperlipidemia   . Hypertension     Past Surgical History:  Procedure Laterality Date  . KNEE ARTHROSCOPY  AGE 74 (APPROX)   RIGHT KNEE  . KNEE ARTHROSCOPY  07/13/2012   Procedure: ARTHROSCOPY KNEE;  Surgeon: Magnus Sinning, MD;  Location: [place];  Service: Orthopedics;  Laterality: Right;  WITH PARTIAL MEDIAL MENISECTOMY AND REMOVAL OF LOOSE BODIES  . torn rotator cuff      Social History   Tobacco Use  . Smoking status: Never Smoker  . Smokeless tobacco: Never Used  Substance Use Topics  . Alcohol use: Yes    Comment: 4 beers a month    Family History  Problem Relation Age of Onset  . Cancer Mother   . Diabetes Father   . Heart disease Father   . Hyperlipidemia Father   . Hypertension Father   . Kidney disease Father   . Colon cancer Neg Hx     ROS Per hpi  OBJECTIVE:  Blood pressure (!) 161/92, pulse 83, temperature 98.6 F (37 C), temperature source  Oral, height 5\' 11"  (1.803 m), weight 232 lb 6.4 oz (105.4 kg), SpO2 97 %. Body mass index is 32.41 kg/m.   BP Readings from Last 3 Encounters:  11/07/18 (!) 161/92  07/24/18 130/80  07/04/18 (!) 160/90    Physical Exam  Constitutional: He is oriented to person, place, and time. He appears well-developed and well-nourished.  HENT:  Head: Normocephalic and atraumatic.  Mouth/Throat: Oropharynx is clear and moist.  Eyes: Pupils are equal, round, and reactive to light. Conjunctivae and EOM are normal.  Neck: Neck supple.  Cardiovascular: Normal rate and regular rhythm. Exam reveals no gallop and no friction rub.  No murmur heard. Pulmonary/Chest: Effort normal and breath sounds normal. He has no wheezes. He has no rales.  Musculoskeletal: He exhibits no edema.         Right knee: Normal.       Left knee: He exhibits effusion and abnormal meniscus (equivocal mcmurays, + apleys with internal rotation). He exhibits normal range of motion (but painful towards endpoint of flexion and extension), no swelling, no erythema, no LCL laxity, normal patellar mobility, no bony tenderness and no MCL laxity. Tenderness found. Lateral joint line tenderness noted. No MCL and no LCL tenderness noted.  Intact SLR on left  Neurological: He is alert and oriented to person, place, and time.  Skin: Skin is warm and dry.  Psychiatric: He has a normal mood and affect.  Nursing note and vitals reviewed. limping, favoring left leg   Dg Knee Complete 4 Views Left  Result Date: 11/07/2018 CLINICAL DATA:  Acute pain doing squats EXAM: LEFT KNEE - COMPLETE 4+ VIEW COMPARISON:  07/17/2011 FINDINGS: Mild to moderate tricompartment degenerative changes, most pronounced in the lateral and patellofemoral compartments with joint space narrowing and spurring. Trace joint effusion. No acute bony abnormality. Specifically, no fracture, subluxation, or dislocation. IMPRESSION: Mild-to-moderate degenerative changes as above. Trace joint effusion. No acute bony abnormality. Electronically Signed   By: Rolm Baptise M.D.   On: 11/07/2018 10:58    ASSESSMENT and PLAN  1. Acute pain of left knee Discussed RICE therapy, cont with OTC ibu, tramadol for severe pain. Placed in hinged brace. Off work as Audiological scientist. Most likely meniscal but exam suggestive of possible partial distal quad tear. Referring to ortho for further eval and treatment. Defer to ortho regarding return to work - DG Knee Complete 4 Views Left; Future - Brace application - hinged - Ambulatory referral to Orthopedic Surgery  2. Acute pain of left thigh - Ambulatory referral to Orthopedic Surgery  Other orders - ibuprofen (ADVIL,MOTRIN) 200 MG tablet; Take 200 mg by mouth every 6 (six) hours as needed. - traMADol (ULTRAM) 50 MG  tablet; Take 1 tablet (50 mg total) by mouth every 8 (eight) hours as needed.  Return if symptoms worsen or fail to improve.    Rutherford Guys, MD Primary Care at [facility], [place] [address] Ph.  [phone] Fax [phone]

## 2018-11-14 ENCOUNTER — Other Ambulatory Visit: Payer: Self-pay

## 2018-11-14 ENCOUNTER — Ambulatory Visit: Payer: Managed Care, Other (non HMO) | Admitting: Family Medicine

## 2018-11-14 ENCOUNTER — Encounter: Payer: Self-pay | Admitting: Family Medicine

## 2018-11-14 VITALS — BP 132/92 | HR 97 | Temp 97.7°F | Resp 16 | Ht 71.0 in | Wt 229.0 lb

## 2018-11-14 DIAGNOSIS — I10 Essential (primary) hypertension: Secondary | ICD-10-CM

## 2018-11-14 DIAGNOSIS — M25562 Pain in left knee: Secondary | ICD-10-CM | POA: Diagnosis not present

## 2018-11-14 DIAGNOSIS — Z23 Encounter for immunization: Secondary | ICD-10-CM

## 2018-11-14 NOTE — Progress Notes (Signed)
11/20/20194:37 PM  Blake Roberts [birth year], 52 y.o. male [id number]  Chief Complaint  Patient presents with  . Follow-up    left knee is doing much better and would like a note to return back to work. Have an appt with ortho 11/21/2018    HPI:   Patient is a 52 y.o. male with past medical history significant for HTN, gout who presents today for followup on left knee pain  Knee pain resolved with RICE therapy Has appt with ortho next week Would like letter to go back to work  Took amlodipine this morning  Fall Risk  11/14/2018 11/07/2018 07/24/2018 07/04/2018 12/05/2017  Falls in the past year? 0 0 No No No     Depression screen [facility] 2/9 11/14/2018 07/24/2018 07/04/2018  Decreased Interest 0 0 0  Down, Depressed, Hopeless 0 0 0  PHQ - 2 Score 0 0 0    Allergies  Allergen Reactions  . Ace Inhibitors Other (See Comments)    ANGIOEDEMA OF JOINTS- SEVERE    Prior to Admission medications   Medication Sig Start Date End Date Taking? Authorizing Provider  amLODipine (NORVASC) 10 MG tablet TAKE 1 TABLET(10 MG) BY MOUTH DAILY 10/25/18   Wendie Agreste, MD  atorvastatin (LIPITOR) 20 MG tablet Take 1 tablet (20 mg total) by mouth daily. 07/04/18   Forrest Moron, MD  ibuprofen (ADVIL,MOTRIN) 200 MG tablet Take 200 mg by mouth every 6 (six) hours as needed.    [provider]  ketoconazole (NIZORAL) 2 % shampoo Apply 1 application topically 2 (two) times a week. 07/05/18   Forrest Moron, MD  tadalafil (CIALIS) 10 MG tablet Take 1 tablet (10 mg total) by mouth daily as needed for erectile dysfunction. 07/04/18   Forrest Moron, MD  traMADol (ULTRAM) 50 MG tablet Take 1 tablet (50 mg total) by mouth every 8 (eight) hours as needed. 11/07/18   Rutherford Guys, MD    Past Medical History:  Diagnosis Date  . Acute meniscal tear of knee RIGHT KNEE  . Anxiety   . Arthritis RIGHT KNEE  . Bulging lumbar disc L5 - S1  . Depression   . Hemochromatosis   .  Hyperlipidemia   . Hypertension     Past Surgical History:  Procedure Laterality Date  . KNEE ARTHROSCOPY  AGE 82 (APPROX)   RIGHT KNEE  . KNEE ARTHROSCOPY  07/13/2012   Procedure: ARTHROSCOPY KNEE;  Surgeon: Magnus Sinning, MD;  Location: [place];  Service: Orthopedics;  Laterality: Right;  WITH PARTIAL MEDIAL MENISECTOMY AND REMOVAL OF LOOSE BODIES  . torn rotator cuff      Social History   Tobacco Use  . Smoking status: Never Smoker  . Smokeless tobacco: Never Used  Substance Use Topics  . Alcohol use: Yes    Comment: 4 beers a month    Family History  Problem Relation Age of Onset  . Cancer Mother   . Diabetes Father   . Heart disease Father   . Hyperlipidemia Father   . Hypertension Father   . Kidney disease Father   . Colon cancer Neg Hx     ROS Per hpi  OBJECTIVE:  Blood pressure (!) 155/80, pulse 97, temperature 97.7 F (36.5 C), temperature source Oral, resp. rate 16, height 5\' 11"  (1.803 m), weight 229 lb (103.9 kg), SpO2 98 %. Body mass index is 31.94 kg/m.   Repeat 132/92  Physical Exam  Constitutional: He is oriented to  person, place, and time. He appears well-developed and well-nourished.  HENT:  Head: Normocephalic and atraumatic.  Mouth/Throat: Oropharynx is clear and moist.  Eyes: Pupils are equal, round, and reactive to light. Conjunctivae and EOM are normal.  Neck: Neck supple.  Pulmonary/Chest: Effort normal.  Neurological: He is alert and oriented to person, place, and time.  Skin: Skin is warm and dry.  Psychiatric: He has a normal mood and affect.  Nursing note and vitals reviewed.   ASSESSMENT and PLAN  1. Acute pain of left knee Work release note given. Keep appt with ortho  2. Essential hypertension Slightly above goal. Discussed monitoring BP at home. Recheck with PCP - Care order/instruction:  Other orders - Flu Vaccine QUAD 6+ mos PF IM (Fluarix Quad PF)  Return for as scheduled with  pcp.    Rutherford Guys, MD Primary Care at [facility] [place], [place] [address] Ph.  [phone] Fax [phone]

## 2018-11-14 NOTE — Patient Instructions (Signed)
° ° ° °  If you have lab work done today you will be contacted with your lab results within the next 2 weeks.  If you have not heard from us then please contact us. The fastest way to get your results is to register for My Chart. ° ° °IF you received an x-ray today, you will receive an invoice from [facility]. Please contact [facility] at [phone] with questions or concerns regarding your invoice.  ° °IF you received labwork today, you will receive an invoice from [facility]. Please contact [facility] at [phone] with questions or concerns regarding your invoice.  ° °Our billing staff will not be able to assist you with questions regarding bills from these companies. ° °You will be contacted with the lab results as soon as they are available. The fastest way to get your results is to activate your My Chart account. Instructions are located on the last page of this paperwork. If you have not heard from us regarding the results in 2 weeks, please contact this office. °  ° ° ° °

## 2018-11-18 ENCOUNTER — Other Ambulatory Visit: Payer: Self-pay | Admitting: Family Medicine

## 2018-11-19 NOTE — Telephone Encounter (Signed)
Requested medication (s) are due for refill today: yes  Requested medication (s) are on the active medication list: yes    Last refill: 11/07/18  #30 0 refills  Future visit scheduled no  Notes to clinic:Not delegated  Requested Prescriptions  Pending Prescriptions Disp Refills   traMADol (ULTRAM) 50 MG tablet [Pharmacy Med Name: TRAMADOL 50MG  TABLETS] 30 tablet 0    Sig: TAKE 1 TABLET(50 MG) BY MOUTH EVERY 8 HOURS AS NEEDED     Not Delegated - Analgesics:  Opioid Agonists Failed - 11/18/2018  5:29 PM      Failed - This refill cannot be delegated      Failed - Urine Drug Screen completed in last 360 days.      Passed - Valid encounter within last 6 months    Recent Outpatient Visits          5 days ago Acute pain of left knee   [facility], Lilia Argue, MD   1 week ago Acute pain of left knee   [facility], Lilia Argue, MD   3 months ago Essential hypertension   [facility], Ranell Patrick, MD   4 months ago Uncontrolled hypertension   Primary Care at [facility], [place], MD   11 months ago Acute gout involving toe of right foot, unspecified cause   [facility] at [facility], Ranell Patrick, MD

## 2018-11-21 ENCOUNTER — Encounter (INDEPENDENT_AMBULATORY_CARE_PROVIDER_SITE_OTHER): Payer: Self-pay | Admitting: Orthopedic Surgery

## 2018-11-21 ENCOUNTER — Ambulatory Visit (INDEPENDENT_AMBULATORY_CARE_PROVIDER_SITE_OTHER): Payer: Managed Care, Other (non HMO) | Admitting: Orthopedic Surgery

## 2018-11-21 VITALS — Ht 71.0 in | Wt 229.0 lb

## 2018-11-21 DIAGNOSIS — S838X2A Sprain of other specified parts of left knee, initial encounter: Secondary | ICD-10-CM | POA: Diagnosis not present

## 2018-11-21 DIAGNOSIS — M25562 Pain in left knee: Secondary | ICD-10-CM | POA: Diagnosis not present

## 2018-11-22 ENCOUNTER — Encounter (INDEPENDENT_AMBULATORY_CARE_PROVIDER_SITE_OTHER): Payer: Self-pay | Admitting: Orthopedic Surgery

## 2018-11-22 DIAGNOSIS — S838X2A Sprain of other specified parts of left knee, initial encounter: Secondary | ICD-10-CM | POA: Diagnosis not present

## 2018-11-22 MED ORDER — LIDOCAINE HCL 1 % IJ SOLN
5.0000 mL | INTRAMUSCULAR | Status: AC | PRN
  Administered 2018-11-22: 5 mL

## 2018-11-22 MED ORDER — METHYLPREDNISOLONE ACETATE 40 MG/ML IJ SUSP
40.0000 mg | INTRAMUSCULAR | Status: AC | PRN
  Administered 2018-11-22: 40 mg via INTRA_ARTICULAR

## 2018-11-22 MED ORDER — BUPIVACAINE HCL 0.25 % IJ SOLN
4.0000 mL | INTRAMUSCULAR | Status: AC | PRN
  Administered 2018-11-22: 4 mL via INTRA_ARTICULAR
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
## 2018-12-20 ENCOUNTER — Telehealth: Payer: Self-pay | Admitting: Family Medicine

## 2018-12-20 NOTE — Telephone Encounter (Unsigned)
Copied from [place] [phone]. Topic: General - Other >> Dec 20, 2018 10:39 AM Windy Kalata wrote: Reason for CRM: Patient states his gout has flared up and would like to know if he can get his prescription filled for Endomethicin, please advise  Best call back is [phone] >> Dec 20, 2018 10:44 AM Windy Kalata wrote: Please call into this pharmacy  [id number] - [place], [place] - [place] [phone] (Phone) [phone] (Fax

## 2018-12-20 NOTE — Telephone Encounter (Signed)
Copied from [place] [id number]. Topic: General - Other >> Dec 20, 2018 10:39 AM Windy Kalata wrote: Reason for CRM: Patient states his gout has flared up and would like to know if he can get his prescription filled for Endomethicin, please advise  Best call back is [phone]

## 2018-12-20 NOTE — Telephone Encounter (Signed)
Patient transferred to make an appointment

## 2019-02-15 ENCOUNTER — Other Ambulatory Visit: Payer: Self-pay | Admitting: Family Medicine

## 2019-02-15 DIAGNOSIS — I10 Essential (primary) hypertension: Secondary | ICD-10-CM

## 2019-02-15 NOTE — Telephone Encounter (Signed)
Requested medication (s) are due for refill today: yes  Requested medication (s) are on the active medication list: yes  Last refill:  10/25/18 #30 2 RF  Future visit scheduled: no- Pt 7 months overdue for HTN OV. Called pt and left message to call back to schedule an office visit for further refills  Notes to clinic:  >3 months overdue Routing to office    Requested Prescriptions  Pending Prescriptions Disp Refills   amLODipine (NORVASC) 10 MG tablet [Pharmacy Med Name: AMLODIPINE BESYLATE 10MG  TABLETS] 30 tablet 2    Sig: TAKE 1 TABLET(10 MG) BY MOUTH DAILY     Cardiovascular:  Calcium Channel Blockers Failed - 02/15/2019 11:32 AM      Failed - Last BP in normal range    BP Readings from Last 1 Encounters:  11/14/18 (!) 132/92         Failed - Valid encounter within last 6 months    Recent Outpatient Visits          3 months ago Acute pain of left knee   [facility], Lilia Argue, MD   3 months ago Acute pain of left knee   [facility], Lilia Argue, MD   6 months ago Essential hypertension   [facility], [place] Patrick, MD   7 months ago Uncontrolled hypertension   [facility], Zoe A, MD   1 year ago Acute gout involving toe of right foot, unspecified cause   [facility], [place] Patrick, MD

## 2019-02-16 ENCOUNTER — Telehealth: Payer: Self-pay

## 2019-02-16 NOTE — Telephone Encounter (Signed)
Lm for pt to call to make appt for ov. Sending 30 day supply

## 2019-02-22 ENCOUNTER — Encounter: Payer: Self-pay | Admitting: Gastroenterology

## 2019-03-17 ENCOUNTER — Other Ambulatory Visit: Payer: Self-pay | Admitting: Family Medicine

## 2019-03-17 DIAGNOSIS — I10 Essential (primary) hypertension: Secondary | ICD-10-CM

## 2019-06-19 ENCOUNTER — Other Ambulatory Visit: Payer: Self-pay

## 2019-06-19 ENCOUNTER — Encounter: Payer: Self-pay | Admitting: Family Medicine

## 2019-06-19 ENCOUNTER — Ambulatory Visit (INDEPENDENT_AMBULATORY_CARE_PROVIDER_SITE_OTHER): Payer: BC Managed Care – PPO | Admitting: Family Medicine

## 2019-06-19 VITALS — BP 140/82 | HR 85 | Temp 98.6°F | Resp 16 | Wt 220.0 lb

## 2019-06-19 DIAGNOSIS — M109 Gout, unspecified: Secondary | ICD-10-CM

## 2019-06-19 DIAGNOSIS — N529 Male erectile dysfunction, unspecified: Secondary | ICD-10-CM

## 2019-06-19 DIAGNOSIS — Z23 Encounter for immunization: Secondary | ICD-10-CM | POA: Diagnosis not present

## 2019-06-19 DIAGNOSIS — Z1211 Encounter for screening for malignant neoplasm of colon: Secondary | ICD-10-CM

## 2019-06-19 DIAGNOSIS — E782 Mixed hyperlipidemia: Secondary | ICD-10-CM | POA: Diagnosis not present

## 2019-06-19 DIAGNOSIS — I10 Essential (primary) hypertension: Secondary | ICD-10-CM | POA: Diagnosis not present

## 2019-06-19 MED ORDER — ATORVASTATIN CALCIUM 20 MG PO TABS: 20.0000 mg | ORAL_TABLET | Freq: Every day | ORAL | 2 refills | Status: DC

## 2019-06-19 MED ORDER — TADALAFIL 10 MG PO TABS: 10.0000 mg | ORAL_TABLET | Freq: Every day | ORAL | 5 refills | Status: DC | PRN

## 2019-06-19 MED ORDER — COLCHICINE 0.6 MG PO TABS: 0.6000 mg | ORAL_TABLET | Freq: Every day | ORAL | 2 refills | Status: DC

## 2019-06-19 MED ORDER — AMLODIPINE BESYLATE 10 MG PO TABS: 10.0000 mg | ORAL_TABLET | Freq: Every day | ORAL | 2 refills | Status: DC

## 2019-06-19 NOTE — Patient Instructions (Addendum)
  Call Dr. Doyne Keel office to schedule colonoscopy.   If you have a gout flare - can take colchicine. Hold lovastatin for a few days if colchicine used (increased risks of myopathy on both). If frequent flares, may need other med.   Restart blood pressure and cholesterol meds. Return for lab only visit in 6-8 weeks to check lipids.   Follow up in 6 months. Let me know if there are questions and stay safe.      If you have lab work done today you will be contacted with your lab results within the next 2 weeks.  If you have not heard from [place] then please contact us. The fastest way to get your results is to register for My Chart.   IF you received an x-ray today, you will receive an invoice from [facility]. Please contact [facility] at [phone] with questions or concerns regarding your invoice.   IF you received labwork today, you will receive an invoice from [place]. Please contact [facility] at [phone] with questions or concerns regarding your invoice.   Our billing staff will not be able to assist you with questions regarding bills from these companies.  You will be contacted with the lab results as soon as they are available. The fastest way to get your results is to activate your My Chart account. Instructions are located on the last page of this paperwork. If you have not heard from [place] regarding the results in 2 weeks, please contact this office.

## 2019-06-19 NOTE — Progress Notes (Signed)
Subjective:    Patient ID: Blake Roberts, male    DOB: 1966/01/25, 53 y.o.   MRN: [id number]  HPI Blake Roberts is a 53 y.o. male Presents today for: Chief Complaint  Patient presents with  . chronic medical condition    Patient is here today for medication check. Hypertension/Hyperlipidemia. Patient was last seen here 07/24/18 for hypertension. Patient stated lst friday 06/14/19 bp was 160/100 at work. Patient stated he was not on his meds cause he had no ins at that time. Ins kicked in 06/11/19   Hypertension: BP Readings from Last 3 Encounters:  06/19/19 140/82  11/14/18 (!) 132/92  11/07/18 (!) 161/92   Lab Results  Component Value Date   CREATININE 1.27 07/04/2018  off meds temporarily when off insurance for 3 months. No meds in 3 months.  High of 160/100 last Friday. Usually in 51'V diastolic, 616-073 systolic off meds.  Alcohol: 4-6 beers per week. Has been keeping an eye on amount of alcohol - not buying any to bring home. Feels like managing ok.  Caffeine: 2-3 diet cokes in morning.  Working 8a-8p.  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   Hyperlipidemia:  Lab Results  Component Value Date   CHOL 248 (H) 07/04/2018   HDL 48 07/04/2018   LDLCALC 121 (H) 07/04/2018   TRIG 394 (H) 07/04/2018   CHOLHDL 5.2 (H) 07/04/2018   Lab Results  Component Value Date   ALT 20 07/04/2018   AST 20 07/04/2018   ALKPHOS 62 07/04/2018   BILITOT 0.4 07/04/2018  lipitor 10m qd. Off meds for past few months. No new myalgias or side effects on meds.   Gout: Lab Results  Component Value Date   LABURIC 8.6 07/24/2018  has used indomethacin in past for treatment, no daily meds. 2-3 flares last year.  Last flare of gout in December 2019.   Erectile dysfunction: Has used Cialis in past- took 1/2 pill - 514m Worked well - liked longer acting formulation. No new side effects.   Health maintenance: Colonoscopy: due - plan on every 3 years --plans to call and  schedule.  Tdap: given today.   Hemochromatosis: followed by Dr. KaIrene Limbon past.  Lab Results  Component Value Date   WBC 7.4 07/04/2018   HGB 15.0 07/04/2018   HCT 42.9 07/04/2018   MCV 92 07/04/2018   PLT 237 07/04/2018       Patient Active Problem List   Diagnosis Date Noted  . Hyperlipidemia 05/10/2017  . Erectile dysfunction 05/10/2017  . Memory difficulty 09/26/2016  . Hemochromatosis 07/11/2016  . Episodic lightheadedness 12/25/2015  . Anemia, unspecified 12/25/2015  . Chronic left-sided low back pain with sciatica 07/15/2015  . Essential hypertension 02/15/2015  . Rotator cuff tear 06/09/2014  . GERD (gastroesophageal reflux disease) 05/13/2014   Past Medical History:  Diagnosis Date  . Acute meniscal tear of knee RIGHT KNEE  . Anxiety   . Arthritis RIGHT KNEE  . Bulging lumbar disc L5 - S1  . Depression   . Hemochromatosis   . Hyperlipidemia   . Hypertension    Past Surgical History:  Procedure Laterality Date  . KNEE ARTHROSCOPY  AGE 85 (APPROX)   RIGHT KNEE  . KNEE ARTHROSCOPY  07/13/2012   Procedure: ARTHROSCOPY KNEE;  Surgeon: JaMagnus SinningMD;  [facility] Service: Orthopedics;  Laterality: Right;  WITH PARTIAL MEDIAL MENISECTOMY AND REMOVAL OF LOOSE BODIES  . torn rotator cuff  Allergies  Allergen Reactions  . Ace Inhibitors Other (See Comments)    ANGIOEDEMA OF JOINTS- SEVERE   Prior to Admission medications   Medication Sig Start Date End Date Taking? Authorizing Provider  amLODipine (NORVASC) 10 MG tablet TAKE 1 TABLET(10 MG) BY MOUTH DAILY 02/16/19  Yes Wendie Agreste, MD  atorvastatin (LIPITOR) 20 MG tablet Take 1 tablet (20 mg total) by mouth daily. 07/04/18  Yes Stallings, Zoe A, MD  ibuprofen (ADVIL,MOTRIN) 200 MG tablet Take 200 mg by mouth every 6 (six) hours as needed.   Yes [provider]  tadalafil (CIALIS) 10 MG tablet Take 1 tablet (10 mg total) by mouth daily as needed for erectile  dysfunction. 07/04/18  Yes Forrest Moron, MD   Social History   Socioeconomic History  . Marital status: Legally Separated    Spouse name: Not on file  . Number of children: Not on file  . Years of education: BA  . Highest education level: Not on file  Occupational History  . 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 Financial resource strain: Not on file  . Food insecurity    Worry: Not on file    Inability: Not on file  . Transportation needs    Medical: Not on file    Non-medical: Not on file  Tobacco Use  . Smoking status: Never Smoker  . Smokeless tobacco: Never Used  Substance and Sexual Activity  . Alcohol use: Yes    Comment: 4 beers a month  . Drug use: No  . Sexual activity: Yes  Lifestyle  . Physical activity    Days per week: Not on file    Minutes per session: Not on file  . Stress: Not on file  Relationships  . Social Herbalist on phone: Not on file    Gets together: Not on file    Attends religious service: Not on file    Active member of club or organization: Not on file    Attends meetings of clubs or organizations: Not on file    Relationship status: Not on file  . Intimate partner violence    Fear of current or ex partner: Not on file    Emotionally abused: Not on file    Physically abused: Not on file    Forced sexual activity: Not on file  Other Topics Concern  . Not on file  Social History Narrative   Lives at home alone   Separated, 2 step-daughters   Right-handed   Caffeine: 2 diet Cokes per day    Review of Systems  Constitutional: Negative for fatigue and unexpected weight change.  Eyes: Negative for visual disturbance.  Respiratory: Negative for cough, chest tightness and shortness of breath.   Cardiovascular: Negative for chest pain, palpitations and leg swelling.  Gastrointestinal: Negative for abdominal pain and blood in stool.  Neurological: Negative for dizziness, light-headedness and headaches.        Objective:   Physical Exam Vitals signs reviewed.  Constitutional:      Appearance: He is well-developed.  HENT:     Head: Normocephalic and atraumatic.  Eyes:     Pupils: Pupils are equal, round, and reactive to light.  Neck:     Vascular: No carotid bruit or JVD.  Cardiovascular:     Rate and Rhythm: Normal rate and regular rhythm.     Heart sounds: Normal heart sounds. No murmur.  Pulmonary:     Effort: Pulmonary  effort is normal.     Breath sounds: Normal breath sounds. No rales.  Skin:    General: Skin is warm and dry.  Neurological:     Mental Status: He is alert and oriented to person, place, and time.    Vitals:   06/19/19 1053  BP: 140/82  Pulse: 85  Resp: 16  Temp: 98.6 F (37 C)  TempSrc: Oral  SpO2: 97%  Weight: 220 lb (99.8 kg)       Assessment & Plan:   Blake Roberts is a 53 y.o. male Uncontrolled hypertension - Plan: amLODipine (NORVASC) 10 MG tablet, Comprehensive metabolic panel, Tdap vaccine greater than or equal to 7yo IM  - borderline, but off meds.  Restart norvasc 6m, monitor home readings, check CMP  Mixed hyperlipidemia - Plan: atorvastatin (LIPITOR) 20 MG tablet, Lipid Panel, Comprehensive metabolic panel, Tdap vaccine greater than or equal to 7yo IM, Hepatic Function Panel,   -restart lipitor, check hepatic function and lipids in 6-8 weeks.   Erectile dysfunction, unspecified erectile dysfunction type - Plan: tadalafil (CIALIS) 10 MG tablet, Tdap vaccine greater than or equal to 7yo IM,   - cialis Rx given - use lowest effective dose. Side effects and potential risks. Understanding expressed.  Need for prophylactic vaccination with combined diphtheria-tetanus-pertussis (DTP) vaccine  Special screening for malignant neoplasms, colon - Plan  - advised to call GI for appointment.   Gout, unspecified cause, unspecified chronicity, unspecified site - Plan: colchicine 0.6 MG tablet, Uric Acid,   - check uric acid. Colchicine if  needed, consider daily med of frequent use.   Hemochromatosis, unspecified hemochromatosis type - Plan: CBC,   - repeat CBC.    Meds ordered this encounter  Medications  . amLODipine (NORVASC) 10 MG tablet    Sig: Take 1 tablet (10 mg total) by mouth daily.    Dispense:  90 tablet    Refill:  2  . atorvastatin (LIPITOR) 20 MG tablet    Sig: Take 1 tablet (20 mg total) by mouth daily.    Dispense:  90 tablet    Refill:  2  . tadalafil (CIALIS) 10 MG tablet    Sig: Take 1 tablet (10 mg total) by mouth daily as needed for erectile dysfunction.    Dispense:  10 tablet    Refill:  5  . colchicine 0.6 MG tablet    Sig: Take 1 tablet (0.6 mg total) by mouth daily. 2 tabs at onset of gout flare, 1 additional tab in 1 hour if needed.    Dispense:  3 tablet    Refill:  2   Patient Instructions    Call Dr. ADoyne Keeloffice to schedule colonoscopy.   If you have a gout flare - can take colchicine. Hold lovastatin for a few days if colchicine used (increased risks of myopathy on both). If frequent flares, may need other med.   Restart blood pressure and cholesterol meds. Return for lab only visit in 6-8 weeks to check lipids.   Follow up in 6 months. Let me know if there are questions and stay safe.      If you have lab work done today you will be contacted with your lab results within the next 2 weeks.  If you have not heard from [place] please contact [place] The fastest way to get your results is to register for My Chart.   IF you received an x-ray today, you will receive an [facility]. Please contact  [facility] at [phone] with questions or concerns regarding your invoice.   IF you received labwork today, you will receive an invoice from [place]. Please contact [facility] at [phone] with questions or concerns regarding your invoice.   Our billing staff will not be able to assist you with questions regarding bills from these companies.   You will be contacted with the lab results as soon as they are available. The fastest way to get your results is to activate your My Chart account. Instructions are located on the last page of this paperwork. If you have not heard from [place] regarding the results in 2 weeks, please contact this office.       Signed,   Merri Ray, MD [facility].  06/19/19 6:00 PM

## 2019-06-20 LAB — COMPREHENSIVE METABOLIC PANEL
ALT: 19 IU/L (ref 0–44)
AST: 22 IU/L (ref 0–40)
Albumin/Globulin Ratio: 1.7 (ref 1.2–2.2)
Albumin: 5 g/dL — ABNORMAL HIGH (ref 3.8–4.9)
Alkaline Phosphatase: 53 IU/L (ref 39–117)
BUN/Creatinine Ratio: 16 (ref 9–20)
BUN: 18 mg/dL (ref 6–24)
Bilirubin Total: 0.3 mg/dL (ref 0.0–1.2)
CO2: 19 mmol/L — ABNORMAL LOW (ref 20–29)
Calcium: 9.3 mg/dL (ref 8.7–10.2)
Chloride: 99 mmol/L (ref 96–106)
Creatinine, Ser: 1.11 mg/dL (ref 0.76–1.27)
GFR calc Af Amer: 87 mL/min/{1.73_m2} (ref >59)
GFR calc non Af Amer: 75 mL/min/{1.73_m2} (ref >59)
Globulin, Total: 2.9 g/dL (ref 1.5–4.5)
Glucose: 92 mg/dL (ref 65–99)
Potassium: 4.7 mmol/L (ref 3.5–5.2)
Sodium: 136 mmol/L (ref 134–144)
Total Protein: 7.9 g/dL (ref 6.0–8.5)

## 2019-06-20 LAB — CBC
Hematocrit: 43.9 % (ref 37.5–51.0)
Hemoglobin: 14.7 g/dL (ref 13.0–17.7)
MCH: 31.5 pg (ref 26.6–33.0)
MCHC: 33.5 g/dL (ref 31.5–35.7)
MCV: 94 fL (ref 79–97)
Platelets: 263 10*3/uL (ref 150–450)
RBC: 4.66 x10E6/uL (ref 4.14–5.80)
RDW: 13.4 % (ref 11.6–15.4)
WBC: 7.8 10*3/uL (ref 3.4–10.8)

## 2019-06-20 LAB — URIC ACID: Uric Acid: 7.8 mg/dL (ref 3.7–8.6)

## 2019-07-30 ENCOUNTER — Ambulatory Visit (INDEPENDENT_AMBULATORY_CARE_PROVIDER_SITE_OTHER): Payer: 59

## 2019-07-30 ENCOUNTER — Ambulatory Visit (INDEPENDENT_AMBULATORY_CARE_PROVIDER_SITE_OTHER): Payer: 59 | Admitting: Emergency Medicine

## 2019-07-30 ENCOUNTER — Encounter: Payer: Self-pay | Admitting: Emergency Medicine

## 2019-07-30 ENCOUNTER — Other Ambulatory Visit: Payer: Self-pay

## 2019-07-30 VITALS — BP 121/79 | HR 77 | Temp 98.5°F | Resp 16 | Ht 71.0 in | Wt 231.0 lb

## 2019-07-30 DIAGNOSIS — S76212A Strain of adductor muscle, fascia and tendon of left thigh, initial encounter: Secondary | ICD-10-CM

## 2019-07-30 DIAGNOSIS — M7918 Myalgia, other site: Secondary | ICD-10-CM

## 2019-07-30 DIAGNOSIS — M25552 Pain in left hip: Secondary | ICD-10-CM

## 2019-07-30 MED ORDER — TRAMADOL HCL 50 MG PO TABS: 50.0000 mg | ORAL_TABLET | Freq: Three times a day (TID) | ORAL | 0 refills | Status: AC | PRN

## 2019-07-30 MED ORDER — MELOXICAM 7.5 MG PO TABS: 7.5000 mg | ORAL_TABLET | Freq: Every day | ORAL | 1 refills | Status: DC

## 2019-07-30 NOTE — Patient Instructions (Addendum)
     If you have lab work done today you will be contacted with your lab results within the next 2 weeks.  If you have not heard from [place] then please contact us. The fastest way to get your results is to register for My Chart.   IF you received an x-ray today, you will receive an invoice from [facility]. Please contact [facility] at [phone] with questions or concerns regarding your invoice.   IF you received labwork today, you will receive an invoice from [facility]. Please contact [facility] at [phone] with questions or concerns regarding your invoice.   Our billing staff will not be able to assist you with questions regarding bills from these companies.  You will be contacted with the lab results as soon as they are available. The fastest way to get your results is to activate your My Chart account. Instructions are located on the last page of this paperwork. If you have not heard from [place] regarding the results in 2 weeks, please contact this office.     Hip Pain  The hip is the joint between the upper legs and the lower pelvis. The bones, cartilage, tendons, and muscles of your hip joint support your body and allow you to move around. Hip pain can range from a minor ache to severe pain in one or both of your hips. The pain may be felt on the inside of the hip joint near the groin, or the outside near the buttocks and upper thigh. You may also have swelling or stiffness. Follow these instructions at home: Managing pain, stiffness, and swelling  If directed, apply ice to the injured area. ? Put ice in a plastic bag. ? Place a towel between your skin and the bag. ? Leave the ice on for 20 minutes, 2-3 times a day  Sleep with a pillow between your legs on your most comfortable side.  Avoid any activities that cause pain. General instructions  Take over-the-counter and prescription medicines only as told by your health care provider.  Do any exercises as told  by your health care provider.  Record the following: ? How often you have hip pain. ? The location of your pain. ? What the pain feels like. ? What makes the pain worse.  Keep all follow-up visits as told by your health care provider. This is important. Contact a health care provider if:  You cannot put weight on your leg.  Your pain or swelling continues or gets worse after one week.  It gets harder to walk.  You have a fever. Get help right away if:  You fall.  You have a sudden increase in pain and swelling in your hip.  Your hip is red or swollen or very tender to touch. Summary  Hip pain can range from a minor ache to severe pain in one or both of your hips.  The pain may be felt on the inside of the hip joint near the groin, or the outside near the buttocks and upper thigh.  Avoid any activities that cause pain.  Record how often you have hip pain, the location of the pain, what makes it worse and what it feels like. This information is not intended to replace advice given to you by your health care provider. Make sure you discuss any questions you have with your health care provider. Document Released: 06/01/2010 Document Revised: 11/24/2017 Document Reviewed: 11/14/2016 Elsevier Patient Education  2020 [employer].

## 2019-07-30 NOTE — Progress Notes (Signed)
Blake Roberts 53 y.o.   Chief Complaint  Patient presents with  . Hip Pain    LEFT x 3-4 days per patient intense - sharpe    HISTORY OF PRESENT ILLNESS: This is a 53 y.o. male complaining of left hip pain for the past 4 days.  Denies injury.  Sharp constant pain with no radiation and worse with movement.  Better with rest.  No other associated symptoms.  Pain may have started after working out doing dead lifts.  HPI   Prior to Admission medications   Medication Sig Start Date End Date Taking? Authorizing Provider  amLODipine (NORVASC) 10 MG tablet Take 1 tablet (10 mg total) by mouth daily. 06/19/19  Yes Wendie Agreste, MD  atorvastatin (LIPITOR) 20 MG tablet Take 1 tablet (20 mg total) by mouth daily. 06/19/19  Yes Wendie Agreste, MD  colchicine 0.6 MG tablet Take 1 tablet (0.6 mg total) by mouth daily. 2 tabs at onset of gout flare, 1 additional tab in 1 hour if needed. 06/19/19  Yes Wendie Agreste, MD  ibuprofen (ADVIL,MOTRIN) 200 MG tablet Take 200 mg by mouth every 6 (six) hours as needed.   Yes [provider]  tadalafil (CIALIS) 10 MG tablet Take 1 tablet (10 mg total) by mouth daily as needed for erectile dysfunction. 06/19/19  Yes Wendie Agreste, MD    Allergies  Allergen Reactions  . Ace Inhibitors Other (See Comments)    ANGIOEDEMA OF JOINTS- SEVERE    Patient Active Problem List   Diagnosis Date Noted  . Hyperlipidemia 05/10/2017  . Erectile dysfunction 05/10/2017  . Memory difficulty 09/26/2016  . Hemochromatosis 07/11/2016  . Episodic lightheadedness 12/25/2015  . Anemia, unspecified 12/25/2015  . Chronic left-sided low back pain with sciatica 07/15/2015  . Essential hypertension 02/15/2015  . Rotator cuff tear 06/09/2014  . GERD (gastroesophageal reflux disease) 05/13/2014    Past Medical History:  Diagnosis Date  . Acute meniscal tear of knee RIGHT KNEE  . Anxiety   . Arthritis RIGHT KNEE  . Bulging lumbar disc L5 - S1  .  Depression   . Hemochromatosis   . Hyperlipidemia   . Hypertension     Past Surgical History:  Procedure Laterality Date  . KNEE ARTHROSCOPY  AGE 70 (APPROX)   RIGHT KNEE  . KNEE ARTHROSCOPY  07/13/2012   Procedure: ARTHROSCOPY KNEE;  Surgeon: Magnus Sinning, MD;  Location: [place];  Service: Orthopedics;  Laterality: Right;  WITH PARTIAL MEDIAL MENISECTOMY AND REMOVAL OF LOOSE BODIES  . torn rotator cuff      Social History   Socioeconomic History  . Marital status: Legally Separated    Spouse name: Not on file  . Number of children: Not on file  . Years of education: BA  . Highest education level: Not on file  Occupational History  . 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 Financial resource strain: Not on file  . Food insecurity    Worry: Not on file    Inability: Not on file  . Transportation needs    Medical: Not on file    Non-medical: Not on file  Tobacco Use  . Smoking status: Never Smoker  . Smokeless tobacco: Never Used  Substance and Sexual Activity  . Alcohol use: Yes    Comment: 4 beers a month  . Drug use: No  . Sexual activity: Yes  Lifestyle  . Physical activity    Days per  week: Not on file    Minutes per session: Not on file  . Stress: Not on file  Relationships  . Social Herbalist on phone: Not on file    Gets together: Not on file    Attends religious service: Not on file    Active member of club or organization: Not on file    Attends meetings of clubs or organizations: Not on file    Relationship status: Not on file  . Intimate partner violence    Fear of current or ex partner: Not on file    Emotionally abused: Not on file    Physically abused: Not on file    Forced sexual activity: Not on file  Other Topics Concern  . Not on file  Social History Narrative   Lives at home alone   Separated, 2 step-daughters   Right-handed   Caffeine: 2 diet Cokes per day    Family History   Problem Relation Age of Onset  . Cancer Mother   . Diabetes Father   . Heart disease Father   . Hyperlipidemia Father   . Hypertension Father   . Kidney disease Father   . Colon cancer Neg Hx      Review of Systems  Constitutional: Negative.  Negative for chills and fever.  HENT: Negative for congestion and sore throat.   Eyes: Negative.   Respiratory: Negative.  Negative for cough and shortness of breath.   Cardiovascular: Negative.  Negative for chest pain and palpitations.  Gastrointestinal: Negative.  Negative for abdominal pain, diarrhea, nausea and vomiting.  Genitourinary: Negative.  Negative for dysuria, hematuria and urgency.  Musculoskeletal: Positive for joint pain (Left hip pain).  Skin: Negative.   Neurological: Negative.  Negative for headaches.  Endo/Heme/Allergies: Negative.   All other systems reviewed and are negative.   Vitals:   07/30/19 0951  BP: 121/79  Pulse: 77  Resp: 16  Temp: 98.5 F (36.9 C)  SpO2: 96%     Physical Exam Vitals signs reviewed.  Constitutional:      Appearance: Normal appearance.  HENT:     Head: Normocephalic.  Eyes:     Extraocular Movements: Extraocular movements intact.     Pupils: Pupils are equal, round, and reactive to light.  Neck:     Musculoskeletal: Normal range of motion and neck supple.  Cardiovascular:     Rate and Rhythm: Normal rate and regular rhythm.     Heart sounds: Normal heart sounds.  Pulmonary:     Effort: Pulmonary effort is normal.     Breath sounds: Normal breath sounds.  Abdominal:     Palpations: Abdomen is soft.     Tenderness: There is no abdominal tenderness.  Musculoskeletal:     Comments: Left hip: No tenderness to palpation.  Full range of motion but complaining of pain.  No abnormal findings. Left knee and left ankle within normal limits.  Skin:    General: Skin is warm and dry.     Capillary Refill: Capillary refill takes less than 2 seconds.  Neurological:     General: No  focal deficit present.     Mental Status: He is alert and oriented to person, place, and time.     Sensory: No sensory deficit.     Motor: No weakness.     Coordination: Coordination normal.     Gait: Gait normal.  Psychiatric:        Mood and Affect: Mood normal.  Behavior: Behavior normal.    Dg Hip Unilat W Or W/o Pelvis 2-3 Views Left  Result Date: 07/30/2019 CLINICAL DATA:  LEFT hip pain EXAM: DG HIP (WITH OR WITHOUT PELVIS) 2-3V LEFT COMPARISON:  None FINDINGS: Osseous mineralization normal. Hip and SI joint spaces preserved. Suspect small subchondral cyst at the RIGHT hip joint. No acute fracture, dislocation, or additional bone destruction. IMPRESSION: No acute osseous abnormalities. Probable small subchondral cyst at RIGHT hip joint, question degenerative Electronically Signed   By: Lavonia Dana M.D.   On: 07/30/2019 10:16     ASSESSMENT & PLAN: Yuepheng was seen today for hip pain.  Diagnoses and all orders for this visit:  Left hip pain -     DG HIP UNILAT W OR W/O PELVIS 2-3 VIEWS LEFT; Future -     meloxicam (MOBIC) 7.5 MG tablet; Take 1 tablet (7.5 mg total) by mouth daily for 10 days. -     traMADol (ULTRAM) 50 MG tablet; Take 1 tablet (50 mg total) by mouth every 8 (eight) hours as needed for up to 5 days.  Musculoskeletal pain  Groin strain, left, initial encounter    Patient Instructions       If you have lab work done today you will be contacted with your lab results within the next 2 weeks.  If you have not heard from [place] then please contact us. The fastest way to get your results is to register for My Chart.   IF you received an x-ray today, you will receive an invoice from [facility]. Please contact [facility] at [phone] with questions or concerns regarding your invoice.   IF you received labwork today, you will receive an invoice from [place]. Please contact [facility] at [phone] with questions or concerns regarding your  invoice.   Our billing staff will not be able to assist you with questions regarding bills from these companies.  You will be contacted with the lab results as soon as they are available. The fastest way to get your results is to activate your My Chart account. Instructions are located on the last page of this paperwork. If you have not heard from [place] regarding the results in 2 weeks, please contact this office.     Hip Pain  The hip is the joint between the upper legs and the lower pelvis. The bones, cartilage, tendons, and muscles of your hip joint support your body and allow you to move around. Hip pain can range from a minor ache to severe pain in one or both of your hips. The pain may be felt on the inside of the hip joint near the groin, or the outside near the buttocks and upper thigh. You may also have swelling or stiffness. Follow these instructions at home: Managing pain, stiffness, and swelling  If directed, apply ice to the injured area. ? Put ice in a plastic bag. ? Place a towel between your skin and the bag. ? Leave the ice on for 20 minutes, 2-3 times a day  Sleep with a pillow between your legs on your most comfortable side.  Avoid any activities that cause pain. General instructions  Take over-the-counter and prescription medicines only as told by your health care provider.  Do any exercises as told by your health care provider.  Record the following: ? How often you have hip pain. ? The location of your pain. ? What the pain feels like. ? What makes the pain worse.  Keep all follow-up visits  as told by your health care provider. This is important. Contact a health care provider if:  You cannot put weight on your leg.  Your pain or swelling continues or gets worse after one week.  It gets harder to walk.  You have a fever. Get help right away if:  You fall.  You have a sudden increase in pain and swelling in your hip.  Your hip is red or swollen or  very tender to touch. Summary  Hip pain can range from a minor ache to severe pain in one or both of your hips.  The pain may be felt on the inside of the hip joint near the groin, or the outside near the buttocks and upper thigh.  Avoid any activities that cause pain.  Record how often you have hip pain, the location of the pain, what makes it worse and what it feels like. This information is not intended to replace advice given to you by your health care provider. Make sure you discuss any questions you have with your health care provider. Document Released: 06/01/2010 Document Revised: 11/24/2017 Document Reviewed: 11/14/2016 Elsevier Patient Education  2020 Elsevier Inc.      Agustina Caroli, MD [facility]

## 2019-08-15 ENCOUNTER — Other Ambulatory Visit: Payer: Self-pay | Admitting: Emergency Medicine

## 2019-08-15 DIAGNOSIS — M25552 Pain in left hip: Secondary | ICD-10-CM

## 2019-08-22 ENCOUNTER — Encounter: Payer: Self-pay | Admitting: Family Medicine

## 2019-08-22 ENCOUNTER — Other Ambulatory Visit: Payer: Self-pay

## 2019-08-22 ENCOUNTER — Ambulatory Visit: Payer: 59 | Admitting: Family Medicine

## 2019-08-22 VITALS — BP 136/80 | HR 90 | Temp 98.9°F | Resp 14 | Wt 232.2 lb

## 2019-08-22 DIAGNOSIS — N529 Male erectile dysfunction, unspecified: Secondary | ICD-10-CM

## 2019-08-22 DIAGNOSIS — Z23 Encounter for immunization: Secondary | ICD-10-CM | POA: Diagnosis not present

## 2019-08-22 DIAGNOSIS — M25552 Pain in left hip: Secondary | ICD-10-CM | POA: Diagnosis not present

## 2019-08-22 MED ORDER — TADALAFIL 20 MG PO TABS: 10.0000 mg | ORAL_TABLET | Freq: Every day | ORAL | 3 refills | Status: DC | PRN

## 2019-08-22 MED ORDER — MELOXICAM 7.5 MG PO TABS: ORAL_TABLET | ORAL | 0 refills | Status: DC

## 2019-08-22 NOTE — Progress Notes (Signed)
Subjective:    Patient ID: Blake Roberts, male    DOB: July 13, 1966, 53 y.o.   MRN: [id number]  HPI Blake Roberts is a 53 y.o. male Presents today for: Chief Complaint  Patient presents with  . Hip Pain    Left hip pain. was seen on 07/30/19 for the pain but seems to be worse. Sharp stabbing pain. this has been going on for 3 week. Was given med whiched helped a little.   Seen by Dr. Mitchel Honour approximately 2 weeks ago with left hip pain for 4 days.  No known injury.  Possibly started after doing dead lift exercises.  X-ray without acute osseous abnormalities in the left hip, there was a possible small subchondral cyst in the right hip joint, questionable degenerative.  Treated with Mobic 7.5 mg daily x10 days with tramadol as needed for more severe pain.  Noted pain as doing dead lift into extension - pain into front of hip.  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   Soreness returned after meloxicam stopped. Pain deep in left hip.  Notes stabbing pain with standing.  Tramadol at night only until finished.  Pain into upper quad a times.  alleve otc now - improed in morning - still some pain, more sore as day goes on.  No back pain.  No bowel or bladder incontinence, no saddle anesthesia, no lower extremity weakness.  No crutch/assistive device needed.  No prior left hip issue.   Erectile dysfunction: cialis - about once per week. Works well at 10mg  dose. No new side effects or new back pain.  No vision or hearing changes.  No chest pain with exertion.    Patient Active Problem List   Diagnosis Date Noted  . Hyperlipidemia 05/10/2017  . Erectile dysfunction 05/10/2017  . Memory difficulty 09/26/2016  . Hemochromatosis 07/11/2016  . Episodic lightheadedness 12/25/2015  . Anemia, unspecified 12/25/2015  . Chronic left-sided low back pain with sciatica 07/15/2015  . Essential hypertension 02/15/2015  . Rotator cuff tear  06/09/2014  . GERD (gastroesophageal reflux disease) 05/13/2014   Past Medical History:  Diagnosis Date  . Acute meniscal tear of knee RIGHT KNEE  . Anxiety   . Arthritis RIGHT KNEE  . Bulging lumbar disc L5 - S1  . Depression   . Hemochromatosis   . Hyperlipidemia   . Hypertension    Past Surgical History:  Procedure Laterality Date  . KNEE ARTHROSCOPY  AGE 49 (APPROX)   RIGHT KNEE  . KNEE ARTHROSCOPY  07/13/2012   Procedure: ARTHROSCOPY KNEE;  Surgeon: Magnus Sinning, MD;  Location: [place];  Service: Orthopedics;  Laterality: Right;  WITH PARTIAL MEDIAL MENISECTOMY AND REMOVAL OF LOOSE BODIES  . torn rotator cuff     Allergies  Allergen Reactions  . Ace Inhibitors Other (See Comments)    ANGIOEDEMA OF JOINTS- SEVERE   Prior to Admission medications   Medication Sig Start Date End Date Taking? Authorizing Provider  amLODipine (NORVASC) 10 MG tablet Take 1 tablet (10 mg total) by mouth daily. 06/19/19  Yes Wendie Agreste, MD  atorvastatin (LIPITOR) 20 MG tablet Take 1 tablet (20 mg total) by mouth daily. 06/19/19  Yes Wendie Agreste, MD  colchicine 0.6 MG tablet Take 1 tablet (0.6 mg total) by mouth daily. 2 tabs at onset of gout flare, 1 additional tab in 1 hour if needed. 06/19/19  Yes Wendie Agreste,  MD  ibuprofen (ADVIL,MOTRIN) 200 MG tablet Take 200 mg by mouth every 6 (six) hours as needed.   Yes [provider]  meloxicam (MOBIC) 7.5 MG tablet TAKE 1 TABLET(7.5 MG) BY MOUTH DAILY FOR 10 DAYS 08/15/19  Yes Sagardia, Ines Bloomer, MD  tadalafil (CIALIS) 10 MG tablet Take 1 tablet (10 mg total) by mouth daily as needed for erectile dysfunction. 06/19/19  Yes Wendie Agreste, MD   Social History   Socioeconomic History  . Marital status: Legally Separated    Spouse name: Not on file  . Number of children: Not on file  . Years of education: BA  . Highest education level: Not on file  Occupational History  . Occupation: Management consultant  Social Needs  . Financial resource strain: Not on file  . Food insecurity    Worry: Not on file    Inability: Not on file  . Transportation needs    Medical: Not on file    Non-medical: Not on file  Tobacco Use  . Smoking status: Never Smoker  . Smokeless tobacco: Never Used  Substance and Sexual Activity  . Alcohol use: Yes    Comment: 4 beers a month  . Drug use: No  . Sexual activity: Yes  Lifestyle  . Physical activity    Days per week: Not on file    Minutes per session: Not on file  . Stress: Not on file  Relationships  . Social Herbalist on phone: Not on file    Gets together: Not on file    Attends religious service: Not on file    Active member of club or organization: Not on file    Attends meetings of clubs or organizations: Not on file    Relationship status: Not on file  . Intimate partner violence    Fear of current or ex partner: Not on file    Emotionally abused: Not on file    Physically abused: Not on file    Forced sexual activity: Not on file  Other Topics Concern  . Not on file  Social History Narrative   Lives at home alone   Separated, 2 step-daughters   Right-handed   Caffeine: 2 diet Cokes per day    Review of Systems     Objective:   Physical Exam Constitutional:      General: He is not in acute distress.    Appearance: He is well-developed.  HENT:     Head: Normocephalic and atraumatic.  Cardiovascular:     Rate and Rhythm: Normal rate.  Pulmonary:     Effort: Pulmonary effort is normal.  Musculoskeletal:     Left hip: He exhibits normal range of motion and no tenderness (no focal ttp. no lateral pain. ).     Lumbar back: Normal. He exhibits normal range of motion (pain free rom. ), no tenderness, no bony tenderness and no spasm.       Legs:  Neurological:     Mental Status: He is alert and oriented to person, place, and time.    Vitals:   08/22/19 1550  BP: 136/80  Pulse: 90  Resp: 14   Temp: 98.9 F (37.2 C)  TempSrc: Oral  SpO2: 97%  Weight: 232 lb 3.2 oz (105.3 kg)    Dg Hip Unilat W Or W/o Pelvis 2-3 Views Left  Result Date: 07/30/2019 CLINICAL DATA:  LEFT hip pain EXAM: DG HIP (WITH OR WITHOUT PELVIS) 2-3V  LEFT COMPARISON:  None FINDINGS: Osseous mineralization normal. Hip and SI joint spaces preserved. Suspect small subchondral cyst at the RIGHT hip joint. No acute fracture, dislocation, or additional bone destruction. IMPRESSION: No acute osseous abnormalities. Probable small subchondral cyst at RIGHT hip joint, question degenerative Electronically Signed   By: Lavonia Dana M.D.   On: 07/30/2019 10:16         Assessment & Plan:   Blake Roberts is a 53 y.o. male Left hip pain - Plan: meloxicam (MOBIC) 7.5 MG tablet, Ambulatory referral to Orthopedic Surgery  -X-ray without apparent fracture.  Pain appears to be primarily with flexion, abduction, internal rotation, concern for femoral acetabular impingement or possible labral pathology.  -Referred to orthopedics, temporary refill of meloxicam, activity modification discussed and recommended against working if pain with weightbearing, note can be provided if needed.  RTC precautions   Need for prophylactic vaccination and inoculation against influenza - Plan: Flu Vaccine QUAD 36+ mos IM  Erectile dysfunction, unspecified erectile dysfunction type - Plan: tadalafil (CIALIS) 20 MG tablet  - cialis Rx given - use lowest effective dose. Side effects discussed (including but not limited to back pain,  headache/flushing, blue discoloration of vision, possible vascular steal and risk of cardiac effects if underlying unknown coronary artery disease, and permanent sensorineural hearing loss). Understanding expressed.   Meds ordered this encounter  Medications  . tadalafil (CIALIS) 20 MG tablet    Sig: Take 0.5 tablets (10 mg total) by mouth daily as needed for erectile dysfunction.    Dispense:  6 tablet    Refill:  3   . meloxicam (MOBIC) 7.5 MG tablet    Sig: TAKE 1 TABLET(7.5 MG) BY MOUTH DAILY FOR 10 DAYS    Dispense:  10 tablet    Refill:  0   Patient Instructions    meloxicam ok  temporarily.  As we discussed if you are having pain with weightbearing, should not proceed with working.  Let me know if note needed until you see orthopedics.  Cialis refilled.  Let me know if there are questions until you see Ortho.  If you have lab work done today you will be contacted with your lab results within the next 2 weeks.  If you have not heard from [place] then please contact us. The fastest way to get your results is to register for My Chart.   IF you received an x-ray today, you will receive an invoice from [facility]. Please contact [facility] at [phone] with questions or concerns regarding your invoice.   IF you received labwork today, you will receive an invoice from [place]. Please contact [facility] at [phone] with questions or concerns regarding your invoice.   Our billing staff will not be able to assist you with questions regarding bills from these companies.  You will be contacted with the lab results as soon as they are available. The fastest way to get your results is to activate your My Chart account. Instructions are located on the last page of this paperwork. If you have not heard from [place] regarding the results in 2 weeks, please contact this office.       Signed,   Merri Ray, MD [facility] [place].  08/22/19 4:24 PM

## 2019-08-22 NOTE — Patient Instructions (Addendum)
  meloxicam ok  temporarily.  As we discussed if you are having pain with weightbearing, should not proceed with working.  Let me know if note needed until you see orthopedics.  Cialis refilled.  Let me know if there are questions until you see Ortho.  If you have lab work done today you will be contacted with your lab results within the next 2 weeks.  If you have not heard from [place] then please contact us. The fastest way to get your results is to register for My Chart.   IF you received an x-ray today, you will receive an invoice from [facility]. Please contact [facility] at [phone] with questions or concerns regarding your invoice.   IF you received labwork today, you will receive an invoice from [place]. Please contact [facility] at [phone] with questions or concerns regarding your invoice.   Our billing staff will not be able to assist you with questions regarding bills from these companies.  You will be contacted with the lab results as soon as they are available. The fastest way to get your results is to activate your My Chart account. Instructions are located on the last page of this paperwork. If you have not heard from [place] regarding the results in 2 weeks, please contact this office.

## 2019-08-28 ENCOUNTER — Encounter: Payer: Self-pay | Admitting: Orthopaedic Surgery

## 2019-08-28 ENCOUNTER — Other Ambulatory Visit: Payer: Self-pay

## 2019-08-28 ENCOUNTER — Ambulatory Visit (INDEPENDENT_AMBULATORY_CARE_PROVIDER_SITE_OTHER): Payer: 59 | Admitting: Orthopaedic Surgery

## 2019-08-28 DIAGNOSIS — M25552 Pain in left hip: Secondary | ICD-10-CM

## 2019-08-28 NOTE — Progress Notes (Signed)
Office Visit Note   Patient: Blake Roberts           Date of Birth: [birth year]           MRN: [id number] Visit Date: 08/28/2019              Requested by: Wendie Agreste, MD [address] [place],  [place] [id number] PCP: Wendie Agreste, MD   Assessment & Plan: Visit Diagnoses:  1. Pain of left hip joint     Plan: Due to his pain is been ongoing for a month we will try an intra-articular injection to see if this helps dissipate his pain.  It is pain does resolve with this but is short-lived may consider MRI to rule out labral tear or cartilage defect.  He will call us in a month if these not improving.  Like for the next month for him to do no heavy lifting involving the lower body and refrain from high-impact activities.  Questions encouraged and answered by Dr. Ninfa Linden self.  He will follow-up with [place] on as-needed basis pain persist or becomes worse  Follow-Up Instructions: Return if symptoms worsen or fail to improve.   Orders:  No orders of the defined types were placed in this encounter.  No orders of the defined types were placed in this encounter.     Procedures: No procedures performed   Clinical Data: No additional findings.   Subjective: Chief Complaint  Patient presents with  . Left Hip - Pain    HPI Blake Roberts is a 53 year old male was seen for the first time for right hip pain that is been ongoing for the past month no known injury.  But possibly while doing a rack deadlifts she may have injured the hip.  Pains in the left groin.  He is taken some Mobic which helps some.  Ranks his pain to be 5-6 out of 10 most of the time is achy.  At worst his pain is 8 out of 10 and sharp stabbing pain within the groin.  Worse with activity does awaken him.  Denies any radicular symptoms.  He does have occasional achy back but states not acting really to even take medicine for.  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 Radiographs  dated 07/30/2019 are reviewed and show an AP pelvis lateral view of the left hip: No acute fracture no dislocation.  No bony destruction.  Subchondral cysts in the right hip also area suspicious for subchondral cyst in the left hip.  Mild flattening of the left hip femoral head.  Otherwise joint space well maintained.  Review of Systems Denies any fevers chills shortness breath chest pain  Objective: Vital Signs: There were no vitals taken for this visit.  Physical Exam General: Well-developed well-nourished male no acute distress mood and affect appropriate. Psych alert and oriented x3  Ortho Exam Bilateral hips right hip excellent range of motion without pain left hip he has good range of motion with internal rotation causes pain in the groin area.  Good range of motion both L knees without pain.  No effusion abnormal warmth of either knee. Specialty Comments:  No specialty comments available.  Imaging:  PMFS History: Patient Active Problem List   Diagnosis Date Noted  . Hyperlipidemia 05/10/2017  . Erectile dysfunction 05/10/2017  . Memory difficulty 09/26/2016  . Hemochromatosis 07/11/2016  . Episodic lightheadedness 12/25/2015  . Anemia, unspecified 12/25/2015  . Chronic  left-sided low back pain with sciatica 07/15/2015  . Essential hypertension 02/15/2015  . Rotator cuff tear 06/09/2014  . GERD (gastroesophageal reflux disease) 05/13/2014   Past Medical History:  Diagnosis Date  . Acute meniscal tear of knee RIGHT KNEE  . Anxiety   . Arthritis RIGHT KNEE  . Bulging lumbar disc L5 - S1  . Depression   . Hemochromatosis   . Hyperlipidemia   . Hypertension     Family History  Problem Relation Age of Onset  . Cancer Mother   . Diabetes Father   . Heart disease Father   . Hyperlipidemia Father   . Hypertension Father   . Kidney disease Father   . Colon cancer Neg Hx     Past Surgical History:  Procedure Laterality Date  . KNEE ARTHROSCOPY  AGE 89 (APPROX)    RIGHT KNEE  . KNEE ARTHROSCOPY  07/13/2012   Procedure: ARTHROSCOPY KNEE;  Surgeon: Magnus Sinning, MD;  Location: [place];  Service: Orthopedics;  Laterality: Right;  WITH PARTIAL MEDIAL MENISECTOMY AND REMOVAL OF LOOSE BODIES  . torn rotator cuff     Social History   Occupational History  . Occupation: [employer]  Tobacco Use  . Smoking status: Never Smoker  . Smokeless tobacco: Never Used  Substance and Sexual Activity  . Alcohol use: Yes    Comment: 4 beers a month  . Drug use: No  . Sexual activity: Yes

## 2019-08-28 NOTE — Progress Notes (Signed)
Subjective: Patient is here for ultrasound-guided intra-articular left hip injection.   This is for both diagnostic and therapeutic purposes.  Pain for about a month.  Objective: Pain with passive flexion and internal rotation.  Procedure: Ultrasound-guided left hip injection: After sterile prep with Betadine, injected 8 cc 1% lidocaine without epinephrine and 40 mg methylprednisolone using a 22-gauge spinal needle, passing the needle through the iliofemoral ligament into the femoral head/neck junction.  Injectate was seen filling the joint capsule.  He had good immediate relief.  He will follow-up as directed.

## 2019-09-06 ENCOUNTER — Telehealth (INDEPENDENT_AMBULATORY_CARE_PROVIDER_SITE_OTHER): Payer: 59 | Admitting: Family Medicine

## 2019-09-06 ENCOUNTER — Other Ambulatory Visit: Payer: Self-pay

## 2019-09-06 DIAGNOSIS — R197 Diarrhea, unspecified: Secondary | ICD-10-CM | POA: Diagnosis not present

## 2019-09-06 DIAGNOSIS — R112 Nausea with vomiting, unspecified: Secondary | ICD-10-CM | POA: Diagnosis not present

## 2019-09-06 NOTE — Progress Notes (Signed)
CC- nausea, sever cramps and vomiting, diahrea, Metal taste in mouth at time. Cramp on calf and abd cramps on left side.This been going on since 09/04/19 Few years ago dx with hemochromatosis.

## 2019-09-06 NOTE — Progress Notes (Signed)
Virtual Visit via Telephone Note  I connected with [name] on 09/06/19 at 4:46 PM by telephone and verified that I am speaking with the correct person using two identifiers.   I discussed the limitations, risks, security and privacy concerns of performing an evaluation and management service by telephone and the availability of in person appointments. I also discussed with the patient that there may be a patient responsible charge related to this service. The patient expressed understanding and agreed to proceed, consent obtained  Chief complaint:  Nausea, vomiting, diarrhea.  History of Present Illness: Blake Roberts is a 53 y.o. male  4 nights ago - woke up with horrible nausea, vomiting.  Shrimp earlier that night. Some muscle cramps in leg and abdomen following day. Few episodes of diarrhea as well. Did go into work, but sent home next day.  Metal taste after vomiting but no loss of taste or smell.  Feels better now.  No n/v/d past 36 hours.  Drinking fluids ok - 100oz per day. Ate full meal last night, 2 meals today without difficulty. Energy level today back to normal.  No fever, cough, HA, bodyaches.    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  Employer allowed him back to work as feeling better.   Patient Active Problem List   Diagnosis Date Noted  . Hyperlipidemia 05/10/2017  . Erectile dysfunction 05/10/2017  . Memory difficulty 09/26/2016  . Hemochromatosis 07/11/2016  . Episodic lightheadedness 12/25/2015  . Anemia, unspecified 12/25/2015  . Chronic left-sided low back pain with sciatica 07/15/2015  . Essential hypertension 02/15/2015  . Rotator cuff tear 06/09/2014  . GERD (gastroesophageal reflux disease) 05/13/2014   Past Medical History:  Diagnosis Date  . Acute meniscal tear of knee RIGHT KNEE  . Anxiety   . Arthritis RIGHT KNEE   . Bulging lumbar disc L5 - S1  . Depression   . Hemochromatosis   . Hyperlipidemia   . Hypertension    Past Surgical History:  Procedure Laterality Date  . KNEE ARTHROSCOPY  AGE 53 (APPROX)   RIGHT KNEE  . KNEE ARTHROSCOPY  07/13/2012   Procedure: ARTHROSCOPY KNEE;  Surgeon: Magnus Sinning, MD;  Location: [place];  Service: Orthopedics;  Laterality: Right;  WITH PARTIAL MEDIAL MENISECTOMY AND REMOVAL OF LOOSE BODIES  . torn rotator cuff     Allergies  Allergen Reactions  . Ace Inhibitors Other (See Comments)    ANGIOEDEMA OF JOINTS- SEVERE   Prior to Admission medications   Medication Sig Start Date End Date Taking? Authorizing Provider  amLODipine (NORVASC) 10 MG tablet Take 1 tablet (10 mg total) by mouth daily. 06/19/19  Yes Wendie Agreste, MD  atorvastatin (LIPITOR) 20 MG tablet Take 1 tablet (20 mg total) by mouth daily. 06/19/19  Yes Wendie Agreste, MD  colchicine 0.6 MG tablet Take 1 tablet (0.6 mg total) by mouth daily. 2 tabs at onset of gout flare, 1 additional tab in 1 hour if needed. 06/19/19  Yes Wendie Agreste, MD  ibuprofen (ADVIL,MOTRIN) 200 MG tablet Take 200 mg by mouth every 6 (six) hours as needed.   Yes [provider]  tadalafil (CIALIS) 20 MG tablet Take 0.5 tablets (10 mg total) by mouth daily as needed for erectile dysfunction. 08/22/19  Yes Wendie Agreste, MD   Social History   Socioeconomic History  . Marital  status: Legally Separated    Spouse name: Not on file  . Number of children: Not on file  . Years of education: BA  . Highest education level: Not on file  Occupational History  . 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 Financial resource strain: Not on file  . Food insecurity    Worry: Not on file    Inability: Not on file  . Transportation needs    Medical: Not on file    Non-medical: Not on file  Tobacco Use  . Smoking status: Never Smoker  . Smokeless tobacco: Never Used   Substance and Sexual Activity  . Alcohol use: Yes    Comment: 4 beers a month  . Drug use: No  . Sexual activity: Yes  Lifestyle  . Physical activity    Days per week: Not on file    Minutes per session: Not on file  . Stress: Not on file  Relationships  . Social Herbalist on phone: Not on file    Gets together: Not on file    Attends religious service: Not on file    Active member of club or organization: Not on file    Attends meetings of clubs or organizations: Not on file    Relationship status: Not on file  . Intimate partner violence    Fear of current or ex partner: Not on file    Emotionally abused: Not on file    Physically abused: Not on file    Forced sexual activity: Not on file  Other Topics Concern  . Not on file  Social History Narrative   Lives at home alone   Separated, 2 step-daughters   Right-handed   Caffeine: 2 diet Cokes per day     Observations/Objective:   Assessment and Plan: Nausea and vomiting, intractability of vomiting not specified, unspecified vomiting type  Diarrhea, unspecified type  -Suspected gastroenteritis, possible foodborne illness that resolved within a few days.  Significant improvement past day or 2, tolerating meals, tolerating fluids, no respiratory symptoms, no fever, no other concerning symptoms at this time.  RTC/ER precautions given if any return of symptoms or acute worsening symptoms.  Follow Up Instructions: As needed, and if any return in symptoms.    I discussed the assessment and treatment plan with the patient. The patient was provided an opportunity to ask questions and all were answered. The patient agreed with the plan and demonstrated an understanding of the instructions.   The patient was advised to call back or seek an in-person evaluation if the symptoms worsen or if the condition fails to improve as anticipated.  I provided 11 minutes of non-face-to-face time during this encounter.  Signed,    Merri Ray, MD [facility].  09/06/19

## 2019-09-12 ENCOUNTER — Other Ambulatory Visit: Payer: Self-pay | Admitting: Emergency Medicine

## 2019-09-12 DIAGNOSIS — M25552 Pain in left hip: Secondary | ICD-10-CM

## 2019-09-12 NOTE — Telephone Encounter (Signed)
Forwarding medication refill request to the clinical pool for review. 

## 2019-09-21 ENCOUNTER — Other Ambulatory Visit: Payer: Self-pay | Admitting: Family Medicine

## 2019-09-21 DIAGNOSIS — M25552 Pain in left hip: Secondary | ICD-10-CM

## 2019-09-21 NOTE — Telephone Encounter (Signed)
Forwarding medication refill request to the clinical pool for review. 

## 2019-10-28 ENCOUNTER — Other Ambulatory Visit: Payer: Self-pay

## 2019-10-28 ENCOUNTER — Ambulatory Visit: Payer: 59 | Admitting: Family Medicine

## 2019-10-28 ENCOUNTER — Encounter: Payer: Self-pay | Admitting: Family Medicine

## 2019-10-28 VITALS — BP 152/85 | HR 95 | Temp 98.6°F | Wt 227.6 lb

## 2019-10-28 DIAGNOSIS — R1013 Epigastric pain: Secondary | ICD-10-CM | POA: Diagnosis not present

## 2019-10-28 DIAGNOSIS — R112 Nausea with vomiting, unspecified: Secondary | ICD-10-CM

## 2019-10-28 DIAGNOSIS — Z8719 Personal history of other diseases of the digestive system: Secondary | ICD-10-CM | POA: Diagnosis not present

## 2019-10-28 DIAGNOSIS — I10 Essential (primary) hypertension: Secondary | ICD-10-CM | POA: Diagnosis not present

## 2019-10-28 MED ORDER — OMEPRAZOLE 20 MG PO CPDR: 20.0000 mg | DELAYED_RELEASE_CAPSULE | Freq: Two times a day (BID) | ORAL | 1 refills | Status: DC

## 2019-10-28 NOTE — Progress Notes (Signed)
Subjective:    Patient ID: Blake Roberts, male    DOB: [birth year], 53 y.o.   MRN: [id number]  HPI Blake Roberts is a 53 y.o. male Presents today for: Chief Complaint  Patient presents with   Medical Management of Chronic Issues    upper gastric pain issues that has been going on for a while this time its been going on for 2-3 weeks now. Dr Carlota Raspberry tx me once before for this issues but I think this is something chronic Pain radiate fron abd to the mid back area worse after meals. N&V at times   Hypertension: BP Readings from Last 3 Encounters:  10/28/19 (!) 152/85  08/22/19 136/80  07/30/19 121/79   Lab Results  Component Value Date   CREATININE 1.11 06/19/2019  Off meds in June, restarted amlodipine 10 mg daily. Taking daily. Not checking home readings.  No chest pains.   Abdominal Pain: Epigastric pain. Off and on past month or so.  Comes and goes during the day. Radiates to back. Gnawing pain. Possible distension at times.  telemed visit 9/11, diarrhea, n/v thought be gastroenteritis.  persistent episodic pain since.  Worse after meals. Sore if lying back.  Possible dark tarry stools few weeks ago.  Worse with spicy food.  Eating bland foods, drinking fluids ok.  2 caffeinated drinks per day. No alcohol.  Gag response with cough, vomits few times per week - bile. No coffee ground emesis. Some night sweats at times, no measured fevers.  Tx: pepto bismol - min help.  No known hx of PUD. No nsaids.  Last endoscopy in 2017 - Dr. Havery Moros: Normal esophagus Prominent fold vs. polypoid lesion at the GEJ - biopsies obtained Benign appearing gastric polyp, removed with cold forceps Patchy erythematous gastritis, biopsied Normal duodenum, biopsied obtained    Patient Active Problem List   Diagnosis Date Noted   Hyperlipidemia 05/10/2017   Erectile dysfunction 05/10/2017   Memory difficulty 09/26/2016   Hemochromatosis 07/11/2016   Episodic lightheadedness  12/25/2015   Anemia, unspecified 12/25/2015   Chronic left-sided low back pain with sciatica 07/15/2015   Essential hypertension 02/15/2015   Rotator cuff tear 06/09/2014   GERD (gastroesophageal reflux disease) 05/13/2014   Past Medical History:  Diagnosis Date   Acute meniscal tear of knee RIGHT KNEE   Anxiety    Arthritis RIGHT KNEE   Bulging lumbar disc L5 - S1   Depression    Hemochromatosis    Hyperlipidemia    Hypertension    Past Surgical History:  Procedure Laterality Date   KNEE ARTHROSCOPY  AGE 60 (APPROX)   RIGHT KNEE   KNEE ARTHROSCOPY  07/13/2012   Procedure: ARTHROSCOPY KNEE;  Surgeon: Magnus Sinning, MD;  Location: [place];  Service: Orthopedics;  Laterality: Right;  WITH PARTIAL MEDIAL MENISECTOMY AND REMOVAL OF LOOSE BODIES   torn rotator cuff     Allergies  Allergen Reactions   Ace Inhibitors Other (See Comments)    ANGIOEDEMA OF JOINTS- SEVERE   Prior to Admission medications   Medication Sig Start Date End Date Taking? Authorizing Provider  amLODipine (NORVASC) 10 MG tablet Take 1 tablet (10 mg total) by mouth daily. 06/19/19  Yes Wendie Agreste, MD  atorvastatin (LIPITOR) 20 MG tablet Take 1 tablet (20 mg total) by mouth daily. 06/19/19  Yes Wendie Agreste, MD  colchicine 0.6 MG tablet Take 1 tablet (0.6 mg total) by mouth daily. 2 tabs at onset of gout flare, 1  additional tab in 1 hour if needed. 06/19/19  Yes Wendie Agreste, MD  ibuprofen (ADVIL,MOTRIN) 200 MG tablet Take 200 mg by mouth every 6 (six) hours as needed.   Yes [provider]  tadalafil (CIALIS) 20 MG tablet Take 0.5 tablets (10 mg total) by mouth daily as needed for erectile dysfunction. 08/22/19  Yes Wendie Agreste, MD   Social History   Socioeconomic History   Marital status: Legally Separated    Spouse name: Not on file   Number of children: Not on file   Years of education: BA   Highest education level: Not on file    Occupational History   Occupation: [employer]  Social Needs   Financial resource strain: Not on file   Food insecurity    Worry: Not on file    Inability: Not on file   Transportation needs    Medical: Not on file    Non-medical: Not on file  Tobacco Use   Smoking status: Never Smoker   Smokeless tobacco: Never Used  Substance and Sexual Activity   Alcohol use: Yes    Comment: 4 beers a month   Drug use: No   Sexual activity: Yes  Lifestyle   Physical activity    Days per week: Not on file    Minutes per session: Not on file   Stress: Not on file  Relationships   Social connections    Talks on phone: Not on file    Gets together: Not on file    Attends religious service: Not on file    Active member of club or organization: Not on file    Attends meetings of clubs or organizations: Not on file    Relationship status: Not on file   Intimate partner violence    Fear of current or ex partner: Not on file    Emotionally abused: Not on file    Physically abused: Not on file    Forced sexual activity: Not on file  Other Topics Concern   Not on file  Social History Narrative   Lives at home alone   Separated, 2 step-daughters   Right-handed   Caffeine: 2 diet Cokes per day    Review of Systems  Constitutional: Negative for fatigue and unexpected weight change.  Eyes: Negative for visual disturbance.  Respiratory: Negative for cough, chest tightness and shortness of breath.   Cardiovascular: Negative for chest pain, palpitations and leg swelling.  Gastrointestinal: Positive for abdominal pain and vomiting. Negative for anal bleeding and diarrhea.  Neurological: Negative for dizziness, light-headedness and headaches.       Objective:   Physical Exam Vitals signs reviewed.  Constitutional:      Appearance: He is well-developed.  HENT:     Head: Normocephalic and atraumatic.  Eyes:     Pupils: Pupils are equal, round, and reactive to  light.  Neck:     Vascular: No carotid bruit or JVD.  Cardiovascular:     Rate and Rhythm: Normal rate and regular rhythm.     Heart sounds: Normal heart sounds. No murmur.  Pulmonary:     Effort: Pulmonary effort is normal.     Breath sounds: Normal breath sounds. No rales.  Abdominal:     General: Abdomen is flat. Bowel sounds are normal. There is no distension.     Tenderness: There is abdominal tenderness in the epigastric area and left upper quadrant. There is no right CVA tenderness, left CVA tenderness,  guarding or rebound. Negative signs include Murphy's sign and McBurney's sign.  Skin:    General: Skin is warm and dry.  Neurological:     Mental Status: He is alert and oriented to person, place, and time.    Vitals:   10/28/19 1139 10/28/19 1140  BP: (!) 154/92 (!) 152/85  Pulse: 95   Temp: 98.6 F (37 C)   TempSrc: Oral   SpO2: 98%   Weight: 227 lb 9.6 oz (103.2 kg)        Assessment & Plan:    Blake Roberts is a 53 y.o. male Abdominal pain, epigastric - Plan: Ambulatory referral to Gastroenterology, CBC, Lipase, Comprehensive metabolic panel, IFOBT POC (occult bld, rslt in office), omeprazole (PRILOSEC) 20 MG capsule Nausea and vomiting, intractability of vomiting not specified, unspecified vomiting type - Plan: Ambulatory referral to Gastroenterology, CBC, Lipase, Comprehensive metabolic panel History of gastritis - Plan: Ambulatory referral to Gastroenterology, omeprazole (PRILOSEC) 20 MG capsule  -Previous history of gastritis, possible recurrence.  Differential includes peptic ulcer disease, possible pancreatitis with nausea vomiting.  -Check lipase, CBC, CMP.  Consider CT with elevated lipase or leukocytosis.  -Start omeprazole twice daily for possible PUD and trigger avoidance discussed.  Refer to gastroenterology for further evaluation.  ER precautions given  Essential hypertension  - decreased control, may be related to pain.  Monitor home readings, no med  changes at this time unless persistent elevations with RTC precautions.    Meds ordered this encounter  Medications   omeprazole (PRILOSEC) 20 MG capsule    Sig: Take 1 capsule (20 mg total) by mouth 2 (two) times daily before a meal.    Dispense:  60 capsule    Refill:  1   Patient Instructions     Omeprazole twice per day. I will refer you to gastroenterology as well. Avoid spicy foods, no nsaids.  Return to the clinic or go to the nearest emergency room if any of your symptoms worsen or new symptoms occur.  Keep a record of your blood pressures outside of the office and bring them to the next office visit. If running over 140/90 let me know as may need to adjust meds.    Abdominal Pain, Adult Abdominal pain can be caused by many things. Often, abdominal pain is not serious and it gets better with no treatment or by being treated at home. However, sometimes abdominal pain is serious. Your health care provider will do a medical history and a physical exam to try to determine the cause of your abdominal pain. Follow these instructions at home:  Take over-the-counter and prescription medicines only as told by your health care provider. Do not take a laxative unless told by your health care provider.  Drink enough fluid to keep your urine clear or pale yellow.  Watch your condition for any changes.  Keep all follow-up visits as told by your health care provider. This is important. Contact a health care provider if:  Your abdominal pain changes or gets worse.  You are not hungry or you lose weight without trying.  You are constipated or have diarrhea for more than 2-3 days.  You have pain when you urinate or have a bowel movement.  Your abdominal pain wakes you up at night.  Your pain gets worse with meals, after eating, or with certain foods.  You are throwing up and cannot keep anything down.  You have a fever. Get help right away if:  Your pain does not  go away as  soon as your health care provider told you to expect.  You cannot stop throwing up.  Your pain is only in areas of the abdomen, such as the right side or the left lower portion of the abdomen.  You have bloody or black stools, or stools that look like tar.  You have severe pain, cramping, or bloating in your abdomen.  You have signs of dehydration, such as: ? Dark urine, very little urine, or no urine. ? Cracked lips. ? Dry mouth. ? Sunken eyes. ? Sleepiness. ? Weakness. This information is not intended to replace advice given to you by your health care provider. Make sure you discuss any questions you have with your health care provider. Document Released: 09/21/2005 Document Revised: 07/01/2016 Document Reviewed: 05/25/2016 Elsevier Interactive Patient Education  [employer].    If you have lab work done today you will be contacted with your lab results within the next 2 weeks.  If you have not heard from [place] then please contact us. The fastest way to get your results is to register for My Chart.   IF you received an x-ray today, you will receive an invoice from [facility]. Please contact [facility] at [phone] with questions or concerns regarding your invoice.   IF you received labwork today, you will receive an invoice from [facility]. Please contact [facility] at [phone] with questions or concerns regarding your invoice.   Our billing staff will not be able to assist you with questions regarding bills from these companies.  You will be contacted with the lab results as soon as they are available. The fastest way to get your results is to activate your My Chart account. Instructions are located on the last page of this paperwork. If you have not heard from [place] regarding the results in 2 weeks, please contact this office.       Signed,   Merri Ray, MD [facility] [place].  10/28/19 12:34 PM

## 2019-10-28 NOTE — Patient Instructions (Addendum)
Omeprazole twice per day. I will refer you to gastroenterology as well. Avoid spicy foods, no nsaids.  Return to the clinic or go to the nearest emergency room if any of your symptoms worsen or new symptoms occur.  Keep a record of your blood pressures outside of the office and bring them to the next office visit. If running over 140/90 let me know as may need to adjust meds.    Abdominal Pain, Adult Abdominal pain can be caused by many things. Often, abdominal pain is not serious and it gets better with no treatment or by being treated at home. However, sometimes abdominal pain is serious. Your health care provider will do a medical history and a physical exam to try to determine the cause of your abdominal pain. Follow these instructions at home:  Take over-the-counter and prescription medicines only as told by your health care provider. Do not take a laxative unless told by your health care provider.  Drink enough fluid to keep your urine clear or pale yellow.  Watch your condition for any changes.  Keep all follow-up visits as told by your health care provider. This is important. Contact a health care provider if:  Your abdominal pain changes or gets worse.  You are not hungry or you lose weight without trying.  You are constipated or have diarrhea for more than 2-3 days.  You have pain when you urinate or have a bowel movement.  Your abdominal pain wakes you up at night.  Your pain gets worse with meals, after eating, or with certain foods.  You are throwing up and cannot keep anything down.  You have a fever. Get help right away if:  Your pain does not go away as soon as your health care provider told you to expect.  You cannot stop throwing up.  Your pain is only in areas of the abdomen, such as the right side or the left lower portion of the abdomen.  You have bloody or black stools, or stools that look like tar.  You have severe pain, cramping, or bloating in  your abdomen.  You have signs of dehydration, such as: ? Dark urine, very little urine, or no urine. ? Cracked lips. ? Dry mouth. ? Sunken eyes. ? Sleepiness. ? Weakness. This information is not intended to replace advice given to you by your health care provider. Make sure you discuss any questions you have with your health care provider. Document Released: 09/21/2005 Document Revised: 07/01/2016 Document Reviewed: 05/25/2016 Elsevier Interactive Patient Education  [employer].    If you have lab work done today you will be contacted with your lab results within the next 2 weeks.  If you have not heard from [place] then please contact us. The fastest way to get your results is to register for My Chart.   IF you received an x-ray today, you will receive an invoice from [facility]. Please contact [facility] at [phone] with questions or concerns regarding your invoice.   IF you received labwork today, you will receive an invoice from [facility]. Please contact [facility] at [phone] with questions or concerns regarding your invoice.   Our billing staff will not be able to assist you with questions regarding bills from these companies.  You will be contacted with the lab results as soon as they are available. The fastest way to get your results is to activate your My Chart account. Instructions are located on the last page of this paperwork.  If you have not heard from [place] regarding the results in 2 weeks, please contact this office.

## 2019-10-29 LAB — LIPASE: Lipase: 30 U/L (ref 13–78)

## 2019-10-29 LAB — CBC
Hematocrit: 45.1 % (ref 37.5–51.0)
Hemoglobin: 15 g/dL (ref 13.0–17.7)
MCH: 31.9 pg (ref 26.6–33.0)
MCHC: 33.3 g/dL (ref 31.5–35.7)
MCV: 96 fL (ref 79–97)
Platelets: 340 10*3/uL (ref 150–450)
RBC: 4.7 x10E6/uL (ref 4.14–5.80)
RDW: 12.4 % (ref 11.6–15.4)
WBC: 8.5 10*3/uL (ref 3.4–10.8)

## 2019-10-29 LAB — COMPREHENSIVE METABOLIC PANEL
ALT: 27 IU/L (ref 0–44)
AST: 22 IU/L (ref 0–40)
Albumin/Globulin Ratio: 1.9 (ref 1.2–2.2)
Albumin: 5 g/dL — ABNORMAL HIGH (ref 3.8–4.9)
Alkaline Phosphatase: 56 IU/L (ref 39–117)
BUN/Creatinine Ratio: 14 (ref 9–20)
BUN: 16 mg/dL (ref 6–24)
Bilirubin Total: 0.3 mg/dL (ref 0.0–1.2)
CO2: 21 mmol/L (ref 20–29)
Calcium: 9.6 mg/dL (ref 8.7–10.2)
Chloride: 103 mmol/L (ref 96–106)
Creatinine, Ser: 1.15 mg/dL (ref 0.76–1.27)
GFR calc Af Amer: 84 mL/min/{1.73_m2} (ref >59)
GFR calc non Af Amer: 72 mL/min/{1.73_m2} (ref >59)
Globulin, Total: 2.6 g/dL (ref 1.5–4.5)
Glucose: 87 mg/dL (ref 65–99)
Potassium: 5 mmol/L (ref 3.5–5.2)
Sodium: 140 mmol/L (ref 134–144)
Total Protein: 7.6 g/dL (ref 6.0–8.5)

## 2019-11-06 ENCOUNTER — Ambulatory Visit: Payer: Self-pay

## 2019-11-06 ENCOUNTER — Other Ambulatory Visit: Payer: Self-pay | Admitting: Radiology

## 2019-11-06 ENCOUNTER — Other Ambulatory Visit: Payer: Self-pay | Admitting: Physician Assistant

## 2019-11-06 ENCOUNTER — Encounter: Payer: Self-pay | Admitting: Physician Assistant

## 2019-11-06 ENCOUNTER — Ambulatory Visit: Payer: 59 | Admitting: Physician Assistant

## 2019-11-06 ENCOUNTER — Other Ambulatory Visit: Payer: Self-pay

## 2019-11-06 ENCOUNTER — Ambulatory Visit: Payer: 59 | Admitting: Nurse Practitioner

## 2019-11-06 DIAGNOSIS — M5432 Sciatica, left side: Secondary | ICD-10-CM

## 2019-11-06 DIAGNOSIS — M4807 Spinal stenosis, lumbosacral region: Secondary | ICD-10-CM

## 2019-11-06 NOTE — Progress Notes (Signed)
Office Visit Note   Patient: Blake Roberts           Date of Birth: 02/02/1966           MRN: [id number] Visit Date: 11/06/2019              Requested by: Wendie Agreste, MD [address] [place],  [place] [address] PCP: Wendie Agreste, MD   Assessment & Plan: Visit Diagnoses:  1. Sciatica, left side     Plan: Due to patient's severe radicular pain into the left leg the fact that it is not dissipated completely with Medrol Dosepak recommend MRI to evaluate his lumbar spine as a source of his radicular symptoms down the left leg.  He will follow-up after the MRI to go over results discuss further treatment.  Questions encouraged and answered.  Follow-Up Instructions: Return After MRI.   Orders:  Orders Placed This Encounter  Procedures  . XR Lumbar Spine 2-3 Views   No orders of the defined types were placed in this encounter.     Procedures: No procedures performed   Clinical Data: No additional findings.   Subjective: Chief Complaint  Patient presents with  . Lower Back - Pain    HPI Blake Roberts returns today with continued left hip pain but now is having knifelike pain that runs from his buttocks down into his medial lower thigh.  He is also having some numbness tingling like in the arch of his left foot.  All this began last week.  No known injury.  [facility] and was placed on a prednisone Dosepak which helped some but he continues to have the knifelike pain into the thigh.  He denies any back pain.  Having no waking pain, bowel bladder dysfunction, or saddle anesthesia like symptoms.  He does state that the intra-articular injection given to him back in September and his left hip helped. Review of Systems Please see HPI otherwise negative  Objective: Vital Signs: There were no vitals taken for this visit.  Physical Exam Constitutional:      Appearance: He is not ill-appearing or diaphoretic.  Pulmonary:     Effort: Pulmonary effort is  normal.     Breath sounds: Normal breath sounds.  Neurological:     Mental Status: He is alert and oriented to person, place, and time.  Psychiatric:        Mood and Affect: Mood normal.     Ortho Exam Positive straight leg raise on the left negative on the right.  Good range of motion of both hips without pain.  5 out of 5 strength throughout the lower extremities.  He comes within a few inches of being able to touch his toes with flexion of the hip.  Extension of the lumbar spine causes some discomfort.  No tenderness over the lumbar spinal column.  No tenderness paraspinous region lumbar spine.  Specialty Comments:  No specialty comments available.  Imaging: Xr Lumbar Spine 2-3 Views  Result Date: 11/06/2019 Lumbar spine AP and lateral views show severe degenerative disc disease.  Loss of lordotic curvature.  Endplate spurring throughout.  Lower lumbar facet joint changes.  Scoliosis of the spine.  No acute fractures.    PMFS History: Patient Active Problem List   Diagnosis Date Noted  . Hyperlipidemia 05/10/2017  . Erectile dysfunction 05/10/2017  . Memory difficulty 09/26/2016  . Hemochromatosis 07/11/2016  . Episodic lightheadedness 12/25/2015  . Anemia, unspecified 12/25/2015  .  Chronic left-sided low back pain with sciatica 07/15/2015  . Essential hypertension 02/15/2015  . Rotator cuff tear 06/09/2014  . GERD (gastroesophageal reflux disease) 05/13/2014   Past Medical History:  Diagnosis Date  . Acute meniscal tear of knee RIGHT KNEE  . Anxiety   . Arthritis RIGHT KNEE  . Bulging lumbar disc L5 - S1  . Depression   . Hemochromatosis   . Hyperlipidemia   . Hypertension     Family History  Problem Relation Age of Onset  . Cancer Mother   . Diabetes Father   . Heart disease Father   . Hyperlipidemia Father   . Hypertension Father   . Kidney disease Father   . Colon cancer Neg Hx     Past Surgical History:  Procedure Laterality Date  . KNEE ARTHROSCOPY   AGE 69 (APPROX)   RIGHT KNEE  . KNEE ARTHROSCOPY  07/13/2012   Procedure: ARTHROSCOPY KNEE;  Surgeon: Magnus Sinning, MD;  Location: [facility];  Service: Orthopedics;  Laterality: Right;  WITH PARTIAL MEDIAL MENISECTOMY AND REMOVAL OF LOOSE BODIES  . torn rotator cuff     Social History   Occupational History  . Occupation: [employer]  Tobacco Use  . Smoking status: Never Smoker  . Smokeless tobacco: Never Used  Substance and Sexual Activity  . Alcohol use: Yes    Comment: 4 beers a month  . Drug use: No  . Sexual activity: Yes

## 2019-11-14 ENCOUNTER — Encounter: Payer: Self-pay | Admitting: Physician Assistant

## 2019-11-14 ENCOUNTER — Other Ambulatory Visit: Payer: Self-pay

## 2019-11-14 ENCOUNTER — Ambulatory Visit: Payer: 59 | Admitting: Physician Assistant

## 2019-11-14 VITALS — BP 130/88 | HR 90 | Temp 97.6°F | Ht 71.0 in | Wt 228.6 lb

## 2019-11-14 DIAGNOSIS — Z1159 Encounter for screening for other viral diseases: Secondary | ICD-10-CM

## 2019-11-14 DIAGNOSIS — R112 Nausea with vomiting, unspecified: Secondary | ICD-10-CM | POA: Diagnosis not present

## 2019-11-14 DIAGNOSIS — K219 Gastro-esophageal reflux disease without esophagitis: Secondary | ICD-10-CM

## 2019-11-14 DIAGNOSIS — Z86010 Personal history of colonic polyps: Secondary | ICD-10-CM | POA: Diagnosis not present

## 2019-11-14 DIAGNOSIS — R1013 Epigastric pain: Secondary | ICD-10-CM | POA: Diagnosis not present

## 2019-11-14 MED ORDER — NA SULFATE-K SULFATE-MG SULF 17.5-3.13-1.6 GM/177ML PO SOLN: 1.0000 | Freq: Once | ORAL | 0 refills | Status: AC

## 2019-11-14 NOTE — Patient Instructions (Signed)
If you are age 54 or older, your body mass index should be between 23-30. Your Body mass index is 31.88 kg/m. If this is out of the aforementioned range listed, please consider follow up with your Primary Care Provider.  If you are age 8 or younger, your body mass index should be between 19-25. Your Body mass index is 31.88 kg/m. If this is out of the aformentioned range listed, please consider follow up with your Primary Care Provider.   You have been scheduled for an endoscopy and colonoscopy. Please follow the written instructions given to you at your visit today. Please pick up your prep supplies at the pharmacy within the next 1-3 days. If you use inhalers (even only as needed), please bring them with you on the day of your procedure.   Continue Omeprazole 20 mg twice daily.

## 2019-11-14 NOTE — Progress Notes (Signed)
Chief Complaint: Abdominal pain and nausea  HPI:    Blake Roberts is a 53 year old male with a past medical history as listed below, known to Dr. Havery Moros, who was referred to me by Wendie Agreste, MD for a complaint of abdominal pain and nausea.      EGD 01/18/2016 for nausea and dyspepsia.  Findings of a normal esophagus, prominent fold versus polypoid lesion at the GE junction, benign-appearing gastric polyp, patchy erythematous gastritis and normal duodenum.  Resumed on Nexium.  Pathology showed benign type polypoid fold at the GE junction, benign hyperplastic polyp in the stomach.  Otherwise normal.    Colonoscopy 01/18/2016 with 6 small polyps, mild diverticulosis and internal hemorrhoids.  Findings of 4 adenomas and a sessile serrated polyp and hyperplastic polyp.  Repeat recommended in 3 years.    10/28/2019 saw PCP for upper epigastric pain for 2 to 3 weeks radiates from abdomen to the mid back area worse after meals with nausea and vomiting at times.  Also described possible dark tarry stools a few weeks prior.  Pepto-Bismol helped minimally.  At that time had labs including CBC, lipase, CMP and I FOBT.  These all returned negative/normal.  FOBT was never returned.  Continue on Omeprazole 20 mg twice daily.     Today, the patient presents to clinic and explains that he has these "attacks" of severe nausea which leave him vomiting until he has nothing left to vomit and then he dry heaves.  Tells me these typically start with a "cough" and then he starts vomiting.  Also describes epigastric pain which seems to radiate through to his back associated with bloating. Rated as a 7-8/10.  Tightening up his diet to mostly bland/baked foods and decreasing caffeine seems to help.  He was also started Omeprazole 20 mg twice daily a couple of weeks ago by his PCP and has not had an episode since then, but has felt nauseous, but it hasn't led to vomiting.  Does tell me he has a history of being on a PPI in the  past, but only used this for 2 months after time of his last endoscopy and then stopped this medication.  Wonders if he needs to be on something all the time.     Does use Mobic and Ibuprofen 1-2 times a day for some back/sciatic pain.      Reminds me of a family history of pancreatic cancer in his uncle.  Does express excess stress and anxiety during the pandemic.  Tells me he spoke with his ex-wife and she thinks this may be causing some of his problems.    Denies fever, chills, weight loss or melena.  Past Medical History:  Diagnosis Date  . Acute meniscal tear of knee RIGHT KNEE  . Anxiety   . Arthritis RIGHT KNEE  . Bulging lumbar disc L5 - S1  . Depression   . Hemochromatosis   . Hyperlipidemia   . Hypertension     Past Surgical History:  Procedure Laterality Date  . KNEE ARTHROSCOPY  AGE 5 (APPROX)   RIGHT KNEE  . KNEE ARTHROSCOPY  07/13/2012   Procedure: ARTHROSCOPY KNEE;  Surgeon: Magnus Sinning, MD;  Location: [facility];  Service: Orthopedics;  Laterality: Right;  WITH PARTIAL MEDIAL MENISECTOMY AND REMOVAL OF LOOSE BODIES  . torn rotator cuff      Current Outpatient Medications  Medication Sig Dispense Refill  . amLODipine (NORVASC) 10 MG tablet Take 1 tablet (10 mg total)  by mouth daily. 90 tablet 2  . atorvastatin (LIPITOR) 20 MG tablet Take 1 tablet (20 mg total) by mouth daily. 90 tablet 2  . colchicine 0.6 MG tablet Take 1 tablet (0.6 mg total) by mouth daily. 2 tabs at onset of gout flare, 1 additional tab in 1 hour if needed. 3 tablet 2  . ibuprofen (ADVIL,MOTRIN) 200 MG tablet Take 200 mg by mouth every 6 (six) hours as needed.    Marland Kitchen omeprazole (PRILOSEC) 20 MG capsule Take 1 capsule (20 mg total) by mouth 2 (two) times daily before a meal. 60 capsule 1  . tadalafil (CIALIS) 20 MG tablet Take 0.5 tablets (10 mg total) by mouth daily as needed for erectile dysfunction. 6 tablet 3   No current facility-administered medications for this visit.      Allergies as of 11/14/2019 - Review Complete 11/06/2019  Allergen Reaction Noted  . Ace inhibitors Other (See Comments) 02/25/2012    Family History  Problem Relation Age of Onset  . Cancer Mother   . Diabetes Father   . Heart disease Father   . Hyperlipidemia Father   . Hypertension Father   . Kidney disease Father   . Colon cancer Neg Hx     Social History   Socioeconomic History  . Marital status: Legally Separated    Spouse name: Not on file  . Number of children: Not on file  . Years of education: BA  . Highest education level: Not on file  Occupational History  . 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 Financial resource strain: Not on file  . Food insecurity    Worry: Not on file    Inability: Not on file  . Transportation needs    Medical: Not on file    Non-medical: Not on file  Tobacco Use  . Smoking status: Never Smoker  . Smokeless tobacco: Never Used  Substance and Sexual Activity  . Alcohol use: Yes    Comment: 4 beers a month  . Drug use: No  . Sexual activity: Yes  Lifestyle  . Physical activity    Days per week: Not on file    Minutes per session: Not on file  . Stress: Not on file  Relationships  . Social Herbalist on phone: Not on file    Gets together: Not on file    Attends religious service: Not on file    Active member of club or organization: Not on file    Attends meetings of clubs or organizations: Not on file    Relationship status: Not on file  . Intimate partner violence    Fear of current or ex partner: Not on file    Emotionally abused: Not on file    Physically abused: Not on file    Forced sexual activity: Not on file  Other Topics Concern  . Not on file  Social History Narrative   Lives at home alone   Separated, 2 step-daughters   Right-handed   Caffeine: 2 diet Cokes per day    Review of Systems:    Constitutional: No weight loss, fever or chills Skin: No rash  Cardiovascular: No  chest pain Respiratory: No SOB  Gastrointestinal: See HPI and otherwise negative Genitourinary: No dysuria Neurological: No headache Musculoskeletal: +sciatica Hematologic: No bleeding  Psychiatric: No history of depression or anxiety   Physical Exam:  Vital signs: BP 130/88   Pulse 90   Temp 97.6 F (36.4  C)   Ht 5\' 11"  (1.803 m)   Wt 228 lb 9 oz (103.7 kg)   BMI 31.88 kg/m   Constitutional:   Pleasant Caucasian male appears to be in NAD, Well developed, Well nourished, alert and cooperative Head:  Normocephalic and atraumatic. Eyes:   PEERL, EOMI. No icterus. Conjunctiva pink. Ears:  Normal auditory acuity. Neck:  Supple Throat: Oral cavity and pharynx without inflammation, swelling or lesion.  Respiratory: Respirations even and unlabored. Lungs clear to auscultation bilaterally.   No wheezes, crackles, or rhonchi.  Cardiovascular: Normal S1, S2. No MRG. Regular rate and rhythm. No peripheral edema, cyanosis or pallor.  Gastrointestinal:  Soft, nondistended, moderate epigastric ttp,  No rebound or guarding. Normal bowel sounds. No appreciable masses or hepatomegaly. Rectal:  Not performed.  Msk:  Symmetrical without gross deformities. Without edema, no deformity or joint abnormality.  Neurologic:  Alert and  oriented x4;  grossly normal neurologically.  Skin:   Dry and intact without significant lesions or rashes. Psychiatric: Demonstrates good judgement and reason without abnormal affect or behaviors.  RELEVANT LABS AND IMAGING: CBC    Component Value Date/Time   WBC 8.5 10/28/2019 1457   WBC 7.0 05/09/2017 1420   RBC 4.70 10/28/2019 1457   RBC 4.28 05/09/2017 1420   HGB 15.0 10/28/2019 1457   HGB 12.7 (L) 10/21/2016 0819   HCT 45.1 10/28/2019 1457   HCT 37.4 (L) 10/21/2016 0819   PLT 340 10/28/2019 1457   MCV 96 10/28/2019 1457   MCV 93.3 10/21/2016 0819   MCH 31.9 10/28/2019 1457   MCH 32.5 05/09/2017 1420   MCHC 33.3 10/28/2019 1457   MCHC 35.0 05/09/2017  1420   RDW 12.4 10/28/2019 1457   RDW 13.3 10/21/2016 0819   LYMPHSABS 2.5 10/21/2016 0819   MONOABS 0.5 10/21/2016 0819   EOSABS 0.0 10/21/2016 0819   BASOSABS 0.0 10/21/2016 0819    CMP     Component Value Date/Time   NA 140 10/28/2019 1457   NA 134 (L) 10/21/2016 0819   K 5.0 10/28/2019 1457   K 3.9 10/21/2016 0819   CL 103 10/28/2019 1457   CO2 21 10/28/2019 1457   CO2 20 (L) 10/21/2016 0819   GLUCOSE 87 10/28/2019 1457   GLUCOSE 122 (H) 05/09/2017 1420   GLUCOSE 117 10/21/2016 0819   BUN 16 10/28/2019 1457   BUN 11.6 10/21/2016 0819   CREATININE 1.15 10/28/2019 1457   CREATININE 1.1 10/21/2016 0819   CALCIUM 9.6 10/28/2019 1457   CALCIUM 8.9 10/21/2016 0819   PROT 7.6 10/28/2019 1457   PROT 7.4 10/21/2016 0819   ALBUMIN 5.0 (H) 10/28/2019 1457   ALBUMIN 3.9 10/21/2016 0819   AST 22 10/28/2019 1457   AST 34 10/21/2016 0819   ALT 27 10/28/2019 1457   ALT 39 10/21/2016 0819   ALKPHOS 56 10/28/2019 1457   ALKPHOS 50 10/21/2016 0819   BILITOT 0.3 10/28/2019 1457   BILITOT 0.27 10/21/2016 0819   GFRNONAA 72 10/28/2019 1457   GFRAA 84 10/28/2019 1457    Assessment: 1.  History of adenomatous polyps: Last colonoscopy in 2017 with recommendation to repeat in 3 years 2.  Epigastric pain: Some better with Omeprazole, also with episodes and nausea and vomiting; consider gastritis+/-H. pylori 3.  GERD 4.  Non-intractable nausea and vomiting: post-tussive- consider relation to GERD most likely as symptoms have gotten better with Omeprazole  Plan: 1.  Scheduled patient for an EGD given his nausea and vomiting with epigastric pain and a  colonoscopy for surveillance of his adenomatous polyps with Dr. Havery Moros in [facility]  Did discuss risks, benefits, limitations and alternatives and the patient agrees to proceed.  He will be Covid tested 2 days prior to have procedure. 2.  Continue Omeprazole 20 mg twice daily, 30-6- minutes before breakfast and dinner.  Patient does not think  he needs any refills at this time. 3.  Reviewed antireflux diet and lifestyle modifications. 4.  Patient to follow in clinic per recommendations from Dr. Havery Moros after time of procedure.  Ellouise Newer, PA-C [facility] 11/14/2019, 11:02 AM  Cc: Wendie Agreste, MD

## 2019-11-14 NOTE — Progress Notes (Signed)
Agree with a assessment and plan as outlined.

## 2019-11-25 ENCOUNTER — Ambulatory Visit
Admission: RE | Admit: 2019-11-25 | Discharge: 2019-11-25 | Disposition: A | Discharge status: Home / Self Care | Payer: 59 | Source: Ambulatory Visit | Attending: Physician Assistant | Admitting: Physician Assistant

## 2019-11-25 ENCOUNTER — Other Ambulatory Visit: Payer: Self-pay

## 2019-11-25 DIAGNOSIS — M4807 Spinal stenosis, lumbosacral region: Secondary | ICD-10-CM

## 2019-11-28 ENCOUNTER — Encounter: Payer: Self-pay | Admitting: Orthopaedic Surgery

## 2019-11-28 ENCOUNTER — Ambulatory Visit (INDEPENDENT_AMBULATORY_CARE_PROVIDER_SITE_OTHER): Payer: 59 | Admitting: Orthopaedic Surgery

## 2019-11-28 ENCOUNTER — Other Ambulatory Visit: Payer: Self-pay

## 2019-11-28 DIAGNOSIS — M5432 Sciatica, left side: Secondary | ICD-10-CM

## 2019-11-28 DIAGNOSIS — M4807 Spinal stenosis, lumbosacral region: Secondary | ICD-10-CM

## 2019-11-28 NOTE — Progress Notes (Signed)
The patient comes in today to go over an MRI of his lumbar spine.  He has been having radicular symptoms for a few months after heavy lifting.  He got to where there was a searing type of burning and stabbing pain in the sciatic region on the left side.  While we were waiting on the MRI we did put him on a 6-day steroid taper and that has helped his symptoms to subside.  He is still having the symptoms but no weakness.  He wants to know what he should avoid from an exercise routine standpoint.  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  He denies any numbness and tingling in his feet and he denies any lower extremity weakness.  On exam he has a negative straight leg raise bilaterally and excellent strength in his lower extremities.  I did review the MRI with him.  We went over the studies and it does appear that he has a synovial cyst combined with significant degenerative changes and a disc protrusion at L3-L4 to the left side.  He does have stenosis at that level as well as just above and below.  I would like to send him to Dr. Ernestina Patches for an injection/intervention on the left side at the L3-L4 level.  Certainly a steroid injection at the source could be helpful.  If there is potential to aspirate any of the cyst, we will leave that up to Dr. Romona Curls expertise.  I have counseled the patient and about avoiding heavy lifting and changing his exercise routine to just cardiovascular exercise for now.  We will work on core strengthening exercises.  All question concerns were answered and addressed.  I will see him back in 4 weeks to see how he is doing overall and hopefully he will have had a spine intervention by Dr. Ernestina Patches prior to me seeing him in follow-up.

## 2019-11-29 ENCOUNTER — Other Ambulatory Visit: Payer: 59

## 2019-12-12 ENCOUNTER — Encounter: Payer: Self-pay | Admitting: Gastroenterology

## 2019-12-16 ENCOUNTER — Ambulatory Visit: Payer: PRIVATE HEALTH INSURANCE | Admitting: Family Medicine

## 2019-12-26 ENCOUNTER — Ambulatory Visit: Payer: 59 | Admitting: Orthopaedic Surgery

## 2019-12-31 ENCOUNTER — Other Ambulatory Visit: Payer: Self-pay

## 2019-12-31 ENCOUNTER — Ambulatory Visit: Payer: Self-pay

## 2019-12-31 ENCOUNTER — Encounter: Payer: Self-pay | Admitting: Physical Medicine and Rehabilitation

## 2019-12-31 ENCOUNTER — Ambulatory Visit (INDEPENDENT_AMBULATORY_CARE_PROVIDER_SITE_OTHER): Payer: 59 | Admitting: Physical Medicine and Rehabilitation

## 2019-12-31 VITALS — BP 139/80 | HR 87

## 2019-12-31 DIAGNOSIS — M5416 Radiculopathy, lumbar region: Secondary | ICD-10-CM | POA: Diagnosis not present

## 2019-12-31 MED ORDER — DEXAMETHASONE SODIUM PHOSPHATE 10 MG/ML IJ SOLN: 15.0000 mg | Freq: Once | INTRAMUSCULAR | Status: DC

## 2019-12-31 NOTE — Progress Notes (Signed)
Lower back pain radiating into left lateral hip. Back pain is primarily left sided. Standing for long periods of time causes pain to increase.  Numeric Pain Rating Scale and Functional Assessment Average Pain 4   In the last MONTH (on 0-10 scale) has pain interfered with the following?  1. General activity like being  able to carry out your everyday physical activities such as walking, climbing stairs, carrying groceries, or moving a chair?  Rating(7)   +Driver, -BT, -Dye Allergies.

## 2020-01-21 ENCOUNTER — Encounter: Payer: Self-pay | Admitting: Physical Medicine and Rehabilitation

## 2020-01-21 ENCOUNTER — Other Ambulatory Visit: Payer: Self-pay

## 2020-01-21 ENCOUNTER — Ambulatory Visit: Payer: 59 | Admitting: Physical Medicine and Rehabilitation

## 2020-01-21 ENCOUNTER — Ambulatory Visit: Payer: Self-pay

## 2020-01-21 VITALS — BP 158/99 | HR 100

## 2020-01-21 DIAGNOSIS — M47816 Spondylosis without myelopathy or radiculopathy, lumbar region: Secondary | ICD-10-CM | POA: Diagnosis not present

## 2020-01-21 MED ORDER — METHYLPREDNISOLONE ACETATE 80 MG/ML IJ SUSP
40.0000 mg | Freq: Once | INTRAMUSCULAR | Status: AC
  Administered 2020-01-21: 40 mg

## 2020-01-21 NOTE — Progress Notes (Signed)
Pt states pain in the left buttocks. Pt states last injection 12/31/19 helped out a lot with lower back pain and left pain. Pt states sitting and standing too long makes pain worse. Nothing seems to help with pain.   .Numeric Pain Rating Scale and Functional Assessment Average Pain 3   In the last MONTH (on 0-10 scale) has pain interfered with the following?  1. General activity like being  able to carry out your everyday physical activities such as walking, climbing stairs, carrying groceries, or moving a chair?  Rating(4)   +Driver, -BT, -Dye Allergies.

## 2020-01-22 ENCOUNTER — Ambulatory Visit: Payer: 59 | Admitting: Orthopaedic Surgery

## 2020-01-23 ENCOUNTER — Ambulatory Visit: Payer: 59 | Admitting: Orthopaedic Surgery

## 2020-01-29 ENCOUNTER — Other Ambulatory Visit: Payer: Self-pay

## 2020-01-29 ENCOUNTER — Ambulatory Visit: Payer: 59 | Admitting: Orthopedic Surgery

## 2020-01-29 ENCOUNTER — Ambulatory Visit: Payer: Self-pay

## 2020-01-29 DIAGNOSIS — M25561 Pain in right knee: Secondary | ICD-10-CM | POA: Diagnosis not present

## 2020-01-29 DIAGNOSIS — M1711 Unilateral primary osteoarthritis, right knee: Secondary | ICD-10-CM

## 2020-01-30 ENCOUNTER — Encounter: Payer: Self-pay | Admitting: Orthopedic Surgery

## 2020-01-30 DIAGNOSIS — M1711 Unilateral primary osteoarthritis, right knee: Secondary | ICD-10-CM

## 2020-01-30 MED ORDER — METHYLPREDNISOLONE ACETATE 40 MG/ML IJ SUSP
40.0000 mg | INTRAMUSCULAR | Status: AC | PRN
  Administered 2020-01-30: 40 mg via INTRA_ARTICULAR

## 2020-01-30 MED ORDER — LIDOCAINE HCL 1 % IJ SOLN
5.0000 mL | INTRAMUSCULAR | Status: AC | PRN
  Administered 2020-01-30: 5 mL

## 2020-01-30 MED ORDER — BUPIVACAINE HCL 0.25 % IJ SOLN
4.0000 mL | INTRAMUSCULAR | Status: AC | PRN
  Administered 2020-01-30: 4 mL via INTRA_ARTICULAR
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
## 2020-03-26 ENCOUNTER — Telehealth (INDEPENDENT_AMBULATORY_CARE_PROVIDER_SITE_OTHER): Payer: 59 | Admitting: Family Medicine

## 2020-03-26 ENCOUNTER — Encounter: Payer: Self-pay | Admitting: Family Medicine

## 2020-03-26 ENCOUNTER — Other Ambulatory Visit: Payer: Self-pay

## 2020-03-26 DIAGNOSIS — K529 Noninfective gastroenteritis and colitis, unspecified: Secondary | ICD-10-CM | POA: Diagnosis not present

## 2020-03-26 MED ORDER — PROMETHAZINE HCL 25 MG PO TABS: 25.0000 mg | ORAL_TABLET | Freq: Three times a day (TID) | ORAL | 0 refills | Status: DC | PRN

## 2020-03-26 MED ORDER — ONDANSETRON 8 MG PO TBDP: 8.0000 mg | ORAL_TABLET | Freq: Three times a day (TID) | ORAL | 0 refills | Status: DC | PRN

## 2020-03-26 NOTE — Progress Notes (Signed)
Virtual Visit Note  I connected with patient on 03/26/20 at 502pm by video via epic and verified that I am speaking with the correct person using two identifiers. Blake Roberts is currently located at home and patient is currently with them during visit. The provider, Rutherford Guys, MD is located in their office at time of visit.  I discussed the limitations, risks, security and privacy concerns of performing an evaluation and management service by telephone and the availability of in person appointments. I also discussed with the patient that there may be a patient responsible charge related to this service. The patient expressed understanding and agreed to proceed.   I provided 12 minutes of non-face-to-face time during this encounter.  CC: nausea, vomiting, abd pain  HPI ? This morning woke up feeling queasy but after having chicken biscuit from [employer], abd pain, nausea, vomiting (non bloody), LUQ, Has loose stools no blood, mild dizziness No fever, chills Tried a bit ginger ale about an hour ago and threw up Minimal fluid intake  Yesterday felt really tired, did not sleep well  No cough, sob, dysuria  Has h/o hematochromatosis  Allergies  Allergen Reactions  . Ace Inhibitors Other (See Comments)    ANGIOEDEMA OF JOINTS- SEVERE    Prior to Admission medications   Medication Sig Start Date End Date Taking? Authorizing Provider  amLODipine (NORVASC) 10 MG tablet Take 1 tablet (10 mg total) by mouth daily. 06/19/19   Wendie Agreste, MD  atorvastatin (LIPITOR) 20 MG tablet Take 1 tablet (20 mg total) by mouth daily. 06/19/19   Wendie Agreste, MD  colchicine 0.6 MG tablet Take 1 tablet (0.6 mg total) by mouth daily. 2 tabs at onset of gout flare, 1 additional tab in 1 hour if needed. Patient taking differently: Take 0.6 mg by mouth as needed. 2 tabs at onset of gout flare, 1 additional tab in 1 hour if needed. 06/19/19   Wendie Agreste, MD  ibuprofen (ADVIL,MOTRIN)  200 MG tablet Take 200 mg by mouth every 6 (six) hours as needed.    [provider]  meloxicam (MOBIC) 7.5 MG tablet Take 7.5 mg by mouth daily.    [provider]  omeprazole (PRILOSEC) 20 MG capsule Take 1 capsule (20 mg total) by mouth 2 (two) times daily before a meal. 10/28/19   Wendie Agreste, MD  tadalafil (CIALIS) 20 MG tablet Take 0.5 tablets (10 mg total) by mouth daily as needed for erectile dysfunction. 08/22/19   Wendie Agreste, MD    Past Medical History:  Diagnosis Date  . Acute meniscal tear of knee RIGHT KNEE  . Anxiety   . Arthritis RIGHT KNEE  . Bulging lumbar disc L5 - S1  . Depression   . Hemochromatosis   . Hyperlipidemia   . Hypertension     Past Surgical History:  Procedure Laterality Date  . KNEE ARTHROSCOPY  AGE 67 (APPROX)   RIGHT KNEE  . KNEE ARTHROSCOPY  07/13/2012   Procedure: ARTHROSCOPY KNEE;  Surgeon: Magnus Sinning, MD;  Location: [facility];  Service: Orthopedics;  Laterality: Right;  WITH PARTIAL MEDIAL MENISECTOMY AND REMOVAL OF LOOSE BODIES  . torn rotator cuff      Social History   Tobacco Use  . Smoking status: Never Smoker  . Smokeless tobacco: Never Used  Substance Use Topics  . Alcohol use: Yes    Comment: 4 beers a month,states heavy beer drinker in younger years  Family History  Problem Relation Age of Onset  . Breast cancer Mother   . Lung cancer Mother   . Diabetes Father        adopted father; no history of biological father  . Heart disease Father   . Hyperlipidemia Father   . Hypertension Father   . Kidney disease Father   . Heart Problems Brother        has pacemaker also handicapped  . Pancreatic cancer Maternal Uncle        age early 40's  . Colon cancer Neg Hx     ROS Per hpi  Objective  Vitals as reported by the patient: none  GEN: AAOx3, NAD HEENT: [place]/AT, pupils are symmetrical, EOMI, non-icteric sclera Resp: breathing comfortably, speaking in full  sentences Skin: no rashes noted, no pallor Psych: good eye contact, normal mood and affect   ASSESSMENT and PLAN  1. Gastroenteritis Discussed supportive measures, new meds r/se/b and RTC precautions.   Other orders - ondansetron (ZOFRAN-ODT) 8 MG disintegrating tablet; Take 1 tablet (8 mg total) by mouth every 8 (eight) hours as needed for nausea or vomiting. - promethazine (PHENERGAN) 25 MG tablet; Take 1 tablet (25 mg total) by mouth every 8 (eight) hours as needed for nausea or vomiting.  FOLLOW-UP: prn   The above assessment and management plan was discussed with the patient. The patient verbalized understanding of and has agreed to the management plan. Patient is aware to call the clinic if symptoms persist or worsen. Patient is aware when to return to the clinic for a follow-up visit. Patient educated on when it is appropriate to go to the emergency department.     Rutherford Guys, MD [facility] [place], [place] [address] Ph.  [phone] Fax [phone]

## 2020-04-10 ENCOUNTER — Other Ambulatory Visit: Payer: Self-pay | Admitting: Family Medicine

## 2020-04-10 DIAGNOSIS — R1013 Epigastric pain: Secondary | ICD-10-CM

## 2020-04-10 DIAGNOSIS — Z8719 Personal history of other diseases of the digestive system: Secondary | ICD-10-CM

## 2020-04-27 NOTE — Progress Notes (Signed)
Blake Roberts - 54 y.o. male MRN [id number]  Date of birth: Nov 28, 1966  Office Visit Note: Visit Date: 12/31/2019 PCP: Wendie Agreste, MD Referred by: Wendie Agreste, MD  Subjective: Chief Complaint  Patient presents with  . Lower Back - Pain   HPI:  Blake Roberts is a 54 y.o. male who comes in today for planned Left L3-L4 lumbar epidural steroid injection with fluoroscopic guidance.  The patient has failed conservative care including home exercise, medications, time and activity modification.  This injection will be diagnostic and hopefully therapeutic.  Please see requesting physician notes for further details and justification.  Main issue appears to be at L3-4 with facet joint cyst and arthropathy causing moderate stenosis particularly on the left.  Would consider left L4 transforaminal injection.   ROS Otherwise per HPI.  Assessment & Plan: Visit Diagnoses:  1. Lumbar radiculopathy     Plan: No additional findings.   Meds & Orders:  Meds ordered this encounter  Medications  . dexamethasone (DECADRON) injection 15 mg    Orders Placed This Encounter  Procedures  . XR C-ARM NO REPORT  . Epidural Steroid injection    Follow-up: Return if symptoms worsen or fail to improve.   Procedures: No procedures performed  Lumbosacral Transforaminal Epidural Steroid Injection - Sub-Pedicular Approach with Fluoroscopic Guidance  Patient: Blake Roberts      Date of Birth: 10-09-1966 MRN: [id number] PCP: Wendie Agreste, MD      Visit Date: 12/31/2019   Universal Protocol:    Date/Time: 12/31/2019  Consent Given By: the patient  Position: PRONE  Additional Comments: Vital signs were monitored before and after the procedure. Patient was prepped and draped in the usual sterile fashion. The correct patient, procedure, and site was verified.   Injection Procedure Details:  Procedure Site One Meds Administered:  Meds ordered this encounter  Medications  .  dexamethasone (DECADRON) injection 15 mg    Laterality: Left  Location/Site:  L3-L4  Needle size: 22 G  Needle type: Spinal  Needle Placement: Transforaminal  Findings:    -Comments: Excellent flow of contrast along the nerve and into the epidural space.  Procedure Details: After squaring off the end-plates to get a true AP view, the C-arm was positioned so that an oblique view of the foramen as noted above was visualized. The target area is just inferior to the "nose of the scotty dog" or sub pedicular. The soft tissues overlying this structure were infiltrated with 2-3 ml. of 1% Lidocaine without Epinephrine.  The spinal needle was inserted toward the target using a "trajectory" view along the fluoroscope beam.  Under AP and lateral visualization, the needle was advanced so it did not puncture dura and was located close the 6 O'Clock position of the pedical in AP tracterory. Biplanar projections were used to confirm position. Aspiration was confirmed to be negative for CSF and/or blood. A 1-2 ml. volume of Isovue-250 was injected and flow of contrast was noted at each level. Radiographs were obtained for documentation purposes.   After attaining the desired flow of contrast documented above, a 0.5 to 1.0 ml test dose of 0.25% Marcaine was injected into each respective transforaminal space.  The patient was observed for 90 seconds post injection.  After no sensory deficits were reported, and normal lower extremity motor function was noted,   the above injectate was administered so that equal amounts of the injectate were placed at each foramen (level) into the transforaminal epidural  space.   Additional Comments:  The patient tolerated the procedure well Dressing: 2 x 2 sterile gauze and Band-Aid    Post-procedure details: Patient was observed during the procedure. Post-procedure instructions were reviewed.  Patient left the clinic in stable condition.     Clinical  History: MRI LUMBAR SPINE WITHOUT CONTRAST  TECHNIQUE: Multiplanar, multisequence MR imaging of the lumbar spine was performed. No intravenous contrast was administered.  COMPARISON:  Lumbar spine radiographs 11/06/2019  FINDINGS: Segmentation:  Standard.  Alignment: Moderate lumbar dextroscoliosis. Minimal retrolisthesis of L2 on L3 and L3 on L4.  Vertebrae: No fracture or suspicious marrow lesion. Mild Modic type 1 endplate changes at X33443 and L3-4. Modic type 2 endplate changes greatest at L5-S1.  Conus medullaris and cauda equina: Conus extends to the L1-2 level and is normal in signal. The cauda equina nerve roots are redundant in appearance related to spinal stenosis at L3-4.  Paraspinal and other soft tissues: Unremarkable.  Disc levels:  Disc desiccation and moderate disc space narrowing from L2-3 to L5-S1.  T12-L1 and L1-2: Minimal disc bulging without stenosis.  L2-3: Circumferential disc bulging, epidural lipomatosis, and mild facet hypertrophy result in mild spinal stenosis, mild left lateral recess stenosis, and mild bilateral neural foraminal stenosis.  L3-4: Circumferential disc bulging, epidural lipomatosis, moderate facet and ligamentum flavum hypertrophy, and a 7 x 2 mm synovial cyst medial to the left facet joint result in moderate spinal stenosis, moderate left lateral recess stenosis, and mild-to-moderate right and moderate left neural foraminal stenosis. Potential left L4 nerve root impingement.  L4-5: Circumferential disc bulging, epidural lipomatosis, and severe left greater than right facet hypertrophy result in mild spinal stenosis, mild bilateral lateral recess stenosis, and mild right and moderate left neural foraminal stenosis. Small left facet joint effusion.  L5-S1: Circumferential disc bulging, a small right central disc protrusion, and moderate right and mild left facet hypertrophy result in borderline to mild right  lateral recess stenosis and moderate right neural foraminal stenosis without spinal stenosis.  IMPRESSION: 1. Lumbar dextroscoliosis with moderately advanced multilevel disc and facet degeneration. 2. Moderate spinal stenosis at L3-4 and mild spinal stenosis at L2-3 and L4-5. 3. Left-sided synovial cyst at L3-4 with left lateral recess stenosis and potential left L4 nerve root impingement. 4. Moderate multilevel neural foraminal stenosis.   Electronically Signed   By: Logan Bores M.D.   On: 11/25/2019 12:37     Objective:  VS:  HT:    WT:   BMI:     BP:139/80  HR:87bpm  TEMP: ( )  RESP:  Physical Exam  Ortho Exam Imaging: No results found.

## 2020-04-27 NOTE — Progress Notes (Signed)
Blake Roberts - 54 y.o. male MRN [id number]  Date of birth: 1966/08/19  Office Visit Note: Visit Date: 01/21/2020 PCP: Wendie Agreste, MD Referred by: Wendie Agreste, MD  Subjective: No chief complaint on file.  HPI:  Blake Roberts is a 54 y.o. male who comes in today for planned Left L3-L4 lumbar facet/medial branch block with fluoroscopic guidance.  The patient has failed conservative care including home exercise, medications, time and activity modification.  This injection will be diagnostic and hopefully therapeutic.  Please see requesting physician notes for further details and justification.  Exam shows concordant low back pain with facet joint loading and extension.   Prior injection 2 weeks ago was a train foraminal epidural steroid injection that really cut his pain more than half.  He still having mechanical complaints with going from sit to stand and standing.  Does have significant facet arthropathy at L3-4 and L4-5.  ROS Otherwise per HPI.  Assessment & Plan: Visit Diagnoses:  1. Spondylosis without myelopathy or radiculopathy, lumbar region     Plan: No additional findings.   Meds & Orders:  Meds ordered this encounter  Medications  . methylPREDNISolone acetate (DEPO-MEDROL) injection 40 mg    Orders Placed This Encounter  Procedures  . Facet Injection  . XR C-ARM NO REPORT    Follow-up: Return if symptoms worsen or fail to improve.   Procedures: No procedures performed  Lumbar Facet Joint Intra-Articular Injection(s) with Fluoroscopic Guidance  Patient: Blake Roberts      Date of Birth: [birth year] MRN: [id number] PCP: Wendie Agreste, MD      Visit Date: 01/21/2020   Universal Protocol:    Date/Time: 01/21/2020  Consent Given By: the patient  Position: PRONE   Additional Comments: Vital signs were monitored before and after the procedure. Patient was prepped and draped in the usual sterile fashion. The correct patient, procedure, and  site was verified.   Injection Procedure Details:  Procedure Site One Meds Administered:  Meds ordered this encounter  Medications  . methylPREDNISolone acetate (DEPO-MEDROL) injection 40 mg     Laterality: Left  Location/Site:  L3-L4  Needle size: 22 guage  Needle type: Spinal  Needle Placement: Articular  Findings:  -Comments: Excellent flow of contrast producing a partial arthrogram.  I was able to aspirate a very small amount of normal-appearing joint fluid.  Procedure Details: The fluoroscope beam is vertically oriented in AP, and the inferior recess is visualized beneath the lower pole of the inferior apophyseal process, which represents the target point for needle insertion. When direct visualization is difficult the target point is located at the medial projection of the vertebral pedicle. The region overlying each aforementioned target is locally anesthetized with a 1 to 2 ml. volume of 1% Lidocaine without Epinephrine.   The spinal needle was inserted into each of the above mentioned facet joints using biplanar fluoroscopic guidance. A 0.25 to 0.5 ml. volume of Isovue-250 was injected and a partial facet joint arthrogram was obtained. A single spot film was obtained of the resulting arthrogram.    One to 1.25 ml of the steroid/anesthetic solution was then injected into each of the facet joints noted above.   Additional Comments:  The patient tolerated the procedure well Dressing: 2 x 2 sterile gauze and Band-Aid    Post-procedure details: Patient was observed during the procedure. Post-procedure instructions were reviewed.  Patient left the clinic in stable condition.     Clinical History: MRI LUMBAR  SPINE WITHOUT CONTRAST  TECHNIQUE: Multiplanar, multisequence MR imaging of the lumbar spine was performed. No intravenous contrast was administered.  COMPARISON:  Lumbar spine radiographs 11/06/2019  FINDINGS: Segmentation:  Standard.  Alignment:  Moderate lumbar dextroscoliosis. Minimal retrolisthesis of L2 on L3 and L3 on L4.  Vertebrae: No fracture or suspicious marrow lesion. Mild Modic type 1 endplate changes at X33443 and L3-4. Modic type 2 endplate changes greatest at L5-S1.  Conus medullaris and cauda equina: Conus extends to the L1-2 level and is normal in signal. The cauda equina nerve roots are redundant in appearance related to spinal stenosis at L3-4.  Paraspinal and other soft tissues: Unremarkable.  Disc levels:  Disc desiccation and moderate disc space narrowing from L2-3 to L5-S1.  T12-L1 and L1-2: Minimal disc bulging without stenosis.  L2-3: Circumferential disc bulging, epidural lipomatosis, and mild facet hypertrophy result in mild spinal stenosis, mild left lateral recess stenosis, and mild bilateral neural foraminal stenosis.  L3-4: Circumferential disc bulging, epidural lipomatosis, moderate facet and ligamentum flavum hypertrophy, and a 7 x 2 mm synovial cyst medial to the left facet joint result in moderate spinal stenosis, moderate left lateral recess stenosis, and mild-to-moderate right and moderate left neural foraminal stenosis. Potential left L4 nerve root impingement.  L4-5: Circumferential disc bulging, epidural lipomatosis, and severe left greater than right facet hypertrophy result in mild spinal stenosis, mild bilateral lateral recess stenosis, and mild right and moderate left neural foraminal stenosis. Small left facet joint effusion.  L5-S1: Circumferential disc bulging, a small right central disc protrusion, and moderate right and mild left facet hypertrophy result in borderline to mild right lateral recess stenosis and moderate right neural foraminal stenosis without spinal stenosis.  IMPRESSION: 1. Lumbar dextroscoliosis with moderately advanced multilevel disc and facet degeneration. 2. Moderate spinal stenosis at L3-4 and mild spinal stenosis at L2-3 and L4-5.  3. Left-sided synovial cyst at L3-4 with left lateral recess stenosis and potential left L4 nerve root impingement. 4. Moderate multilevel neural foraminal stenosis.   Electronically Signed   By: Logan Bores M.D.   On: 11/25/2019 12:37     Objective:  VS:  HT:    WT:   BMI:     BP:(!) 158/99  HR:100bpm  TEMP: ( )  RESP:  Physical Exam  Ortho Exam Imaging: No results found.

## 2020-04-27 NOTE — Procedures (Signed)
Lumbosacral Transforaminal Epidural Steroid Injection - Sub-Pedicular Approach with Fluoroscopic Guidance  Patient: Blake Roberts      Date of Birth: 10-05-1966 MRN: [id number] PCP: Wendie Agreste, MD      Visit Date: 12/31/2019   Universal Protocol:    Date/Time: 12/31/2019  Consent Given By: the patient  Position: PRONE  Additional Comments: Vital signs were monitored before and after the procedure. Patient was prepped and draped in the usual sterile fashion. The correct patient, procedure, and site was verified.   Injection Procedure Details:  Procedure Site One Meds Administered:  Meds ordered this encounter  Medications  . dexamethasone (DECADRON) injection 15 mg    Laterality: Left  Location/Site:  L3-L4  Needle size: 22 G  Needle type: Spinal  Needle Placement: Transforaminal  Findings:    -Comments: Excellent flow of contrast along the nerve and into the epidural space.  Procedure Details: After squaring off the end-plates to get a true AP view, the C-arm was positioned so that an oblique view of the foramen as noted above was visualized. The target area is just inferior to the "nose of the scotty dog" or sub pedicular. The soft tissues overlying this structure were infiltrated with 2-3 ml. of 1% Lidocaine without Epinephrine.  The spinal needle was inserted toward the target using a "trajectory" view along the fluoroscope beam.  Under AP and lateral visualization, the needle was advanced so it did not puncture dura and was located close the 6 O'Clock position of the pedical in AP tracterory. Biplanar projections were used to confirm position. Aspiration was confirmed to be negative for CSF and/or blood. A 1-2 ml. volume of Isovue-250 was injected and flow of contrast was noted at each level. Radiographs were obtained for documentation purposes.   After attaining the desired flow of contrast documented above, a 0.5 to 1.0 ml test dose of 0.25% Marcaine was  injected into each respective transforaminal space.  The patient was observed for 90 seconds post injection.  After no sensory deficits were reported, and normal lower extremity motor function was noted,   the above injectate was administered so that equal amounts of the injectate were placed at each foramen (level) into the transforaminal epidural space.   Additional Comments:  The patient tolerated the procedure well Dressing: 2 x 2 sterile gauze and Band-Aid    Post-procedure details: Patient was observed during the procedure. Post-procedure instructions were reviewed.  Patient left the clinic in stable condition.

## 2020-04-27 NOTE — Procedures (Signed)
Lumbar Facet Joint Intra-Articular Injection(s) with Fluoroscopic Guidance  Patient: Blake Roberts      Date of Birth: May 13, 1966 MRN: [id number] PCP: Wendie Agreste, MD      Visit Date: 01/21/2020   Universal Protocol:    Date/Time: 01/21/2020  Consent Given By: the patient  Position: PRONE   Additional Comments: Vital signs were monitored before and after the procedure. Patient was prepped and draped in the usual sterile fashion. The correct patient, procedure, and site was verified.   Injection Procedure Details:  Procedure Site One Meds Administered:  Meds ordered this encounter  Medications  . methylPREDNISolone acetate (DEPO-MEDROL) injection 40 mg     Laterality: Left  Location/Site:  L3-L4  Needle size: 22 guage  Needle type: Spinal  Needle Placement: Articular  Findings:  -Comments: Excellent flow of contrast producing a partial arthrogram.  I was able to aspirate a very small amount of normal-appearing joint fluid.  Procedure Details: The fluoroscope beam is vertically oriented in AP, and the inferior recess is visualized beneath the lower pole of the inferior apophyseal process, which represents the target point for needle insertion. When direct visualization is difficult the target point is located at the medial projection of the vertebral pedicle. The region overlying each aforementioned target is locally anesthetized with a 1 to 2 ml. volume of 1% Lidocaine without Epinephrine.   The spinal needle was inserted into each of the above mentioned facet joints using biplanar fluoroscopic guidance. A 0.25 to 0.5 ml. volume of Isovue-250 was injected and a partial facet joint arthrogram was obtained. A single spot film was obtained of the resulting arthrogram.    One to 1.25 ml of the steroid/anesthetic solution was then injected into each of the facet joints noted above.   Additional Comments:  The patient tolerated the procedure well Dressing: 2 x 2  sterile gauze and Band-Aid    Post-procedure details: Patient was observed during the procedure. Post-procedure instructions were reviewed.  Patient left the clinic in stable condition.

## 2020-05-26 ENCOUNTER — Telehealth: Payer: Self-pay

## 2020-05-26 NOTE — Telephone Encounter (Signed)
Pt was requesting a reschedule of his 05/28/2020 appt to Friday 05/29/2020. Pt has been rescheduled

## 2020-05-28 ENCOUNTER — Ambulatory Visit: Payer: 59 | Admitting: Family Medicine

## 2020-05-29 ENCOUNTER — Ambulatory Visit (INDEPENDENT_AMBULATORY_CARE_PROVIDER_SITE_OTHER): Payer: 59 | Admitting: Registered Nurse

## 2020-05-29 ENCOUNTER — Encounter: Payer: Self-pay | Admitting: Registered Nurse

## 2020-05-29 ENCOUNTER — Other Ambulatory Visit: Payer: Self-pay

## 2020-05-29 VITALS — BP 137/83 | HR 77 | Temp 97.8°F | Ht 71.0 in | Wt 222.8 lb

## 2020-05-29 DIAGNOSIS — Z130 Encounter for screening for diseases of the blood and blood-forming organs and certain disorders involving the immune mechanism: Secondary | ICD-10-CM

## 2020-05-29 DIAGNOSIS — Z1329 Encounter for screening for other suspected endocrine disorder: Secondary | ICD-10-CM

## 2020-05-29 DIAGNOSIS — R55 Syncope and collapse: Secondary | ICD-10-CM

## 2020-05-29 DIAGNOSIS — N529 Male erectile dysfunction, unspecified: Secondary | ICD-10-CM

## 2020-05-29 DIAGNOSIS — Z13228 Encounter for screening for other metabolic disorders: Secondary | ICD-10-CM

## 2020-05-29 DIAGNOSIS — Z13220 Encounter for screening for lipoid disorders: Secondary | ICD-10-CM

## 2020-05-29 MED ORDER — TADALAFIL 20 MG PO TABS: 10.0000 mg | ORAL_TABLET | Freq: Every day | ORAL | 3 refills | Status: DC | PRN

## 2020-05-29 NOTE — Patient Instructions (Signed)
° ° ° °  If you have lab work done today you will be contacted with your lab results within the next 2 weeks.  If you have not heard from us then please contact us. The fastest way to get your results is to register for My Chart. ° ° °IF you received an x-ray today, you will receive an invoice from [facility]. Please contact [facility] at [phone] with questions or concerns regarding your invoice.  ° °IF you received labwork today, you will receive an invoice from [facility]. Please contact [facility] at [phone] with questions or concerns regarding your invoice.  ° °Our billing staff will not be able to assist you with questions regarding bills from these companies. ° °You will be contacted with the lab results as soon as they are available. The fastest way to get your results is to activate your My Chart account. Instructions are located on the last page of this paperwork. If you have not heard from us regarding the results in 2 weeks, please contact this office. °  ° ° ° °

## 2020-05-29 NOTE — Progress Notes (Signed)
Acute Office Visit  Subjective:    Patient ID: Blake Roberts, male    DOB: 1966/07/26, 54 y.o.   MRN: [id number]  Chief Complaint  Patient presents with  . Migraine    Pt stated that he has been experiencing some headache and some dizzy spells to the point that he felt like he was going to pass out for the pass 3 mos. 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    HPI Patient is in today for near syncope  Episodes have been happening around 1-2 times each week for the past 3 months or so. No clear onset or precipitating event. Has not changed his diet - a little less active for past 6+ months because of back pain.  No CV or respiratory history. Does not feel like he has chest pain, palpitations, doe, shob, visual changes, claudication, or dependent edema. He does note more frequent headaches.  No pattern emerges - has happened at home, at work, and most recently at grocery store where he feels that he needs to "take a knee" to recover for 20-30 seconds. Has not lost consciousness but states that he feels close to that at times.   Past Medical History:  Diagnosis Date  . Acute meniscal tear of knee RIGHT KNEE  . Anxiety   . Arthritis RIGHT KNEE  . Bulging lumbar disc L5 - S1  . Depression   . Hemochromatosis   . Hyperlipidemia   . Hypertension     Past Surgical History:  Procedure Laterality Date  . KNEE ARTHROSCOPY  AGE 33 (APPROX)   RIGHT KNEE  . KNEE ARTHROSCOPY  07/13/2012   Procedure: ARTHROSCOPY KNEE;  Surgeon: Magnus Sinning, MD;  Location: [facility];  Service: Orthopedics;  Laterality: Right;  WITH PARTIAL MEDIAL MENISECTOMY AND REMOVAL OF LOOSE BODIES  . torn rotator cuff      Family History  Problem Relation Age of Onset  . Breast cancer Mother   . Lung cancer Mother   . Diabetes Father        adopted father; no history of biological father  . Heart disease Father   . Hyperlipidemia Father   . Hypertension Father   .  Kidney disease Father   . Heart Problems Brother        has pacemaker also handicapped  . Pancreatic cancer Maternal Uncle        age early 63's  . Colon cancer Neg Hx     Social History   Socioeconomic History  . Marital status: Legally Separated    Spouse name: Not on file  . Number of children: 0  . Years of education: BA  . Highest education level: Not on file  Occupational History  . Occupation: paramedic  Tobacco Use  . Smoking status: Never Smoker  . Smokeless tobacco: Never Used  Substance and Sexual Activity  . Alcohol use: Yes    Comment: 4 beers a month,states heavy beer drinker in younger years  . Drug use: No  . Sexual activity: Yes  Other Topics Concern  . Not on file  Social History Narrative   Lives at home alone   Separated, 2 step-daughters   Right-handed   Caffeine: 2 diet Cokes per day   Social Determinants of Health   Financial Resource Strain:   . Difficulty of Paying Living Expenses:   Food Insecurity:   . Worried About Charity fundraiser in the Last Year:   .  Ran Out of Food in the Last Year:   Transportation Needs:   . Film/video editor (Medical):   Marland Kitchen Lack of Transportation (Non-Medical):   Physical Activity:   . Days of Exercise per Week:   . Minutes of Exercise per Session:   Stress:   . Feeling of Stress :   Social Connections:   . Frequency of Communication with Friends and Family:   . Frequency of Social Gatherings with Friends and Family:   . Attends Religious Services:   . Active Member of Clubs or Organizations:   . Attends Archivist Meetings:   Marland Kitchen Marital Status:   Intimate Partner Violence:   . Fear of Current or Ex-Partner:   . Emotionally Abused:   Marland Kitchen Physically Abused:   . Sexually Abused:     Outpatient Medications Prior to Visit  Medication Sig Dispense Refill  . amLODipine (NORVASC) 10 MG tablet Take 1 tablet (10 mg total) by mouth daily. 90 tablet 2  . atorvastatin (LIPITOR) 20 MG tablet Take 1  tablet (20 mg total) by mouth daily. 90 tablet 2  . ibuprofen (ADVIL,MOTRIN) 200 MG tablet Take 200 mg by mouth every 6 (six) hours as needed.    Marland Kitchen omeprazole (PRILOSEC) 20 MG capsule TAKE 1 CAPSULE(20 MG) BY MOUTH TWICE DAILY BEFORE A MEAL 180 capsule 1  . tadalafil (CIALIS) 20 MG tablet Take 0.5 tablets (10 mg total) by mouth daily as needed for erectile dysfunction. 6 tablet 3  . colchicine 0.6 MG tablet Take 1 tablet (0.6 mg total) by mouth daily. 2 tabs at onset of gout flare, 1 additional tab in 1 hour if needed. (Patient taking differently: Take 0.6 mg by mouth as needed. 2 tabs at onset of gout flare, 1 additional tab in 1 hour if needed.) 3 tablet 2  . meloxicam (MOBIC) 7.5 MG tablet Take 7.5 mg by mouth daily.    . ondansetron (ZOFRAN-ODT) 8 MG disintegrating tablet Take 1 tablet (8 mg total) by mouth every 8 (eight) hours as needed for nausea or vomiting. 20 tablet 0  . promethazine (PHENERGAN) 25 MG tablet Take 1 tablet (25 mg total) by mouth every 8 (eight) hours as needed for nausea or vomiting. 20 tablet 0   Facility-Administered Medications Prior to Visit  Medication Dose Route Frequency Provider Last Rate Last Admin  . dexamethasone (DECADRON) injection 15 mg  15 mg Other Once Magnus Sinning, MD        Allergies  Allergen Reactions  . Ace Inhibitors Other (See Comments)    ANGIOEDEMA OF JOINTS- SEVERE    Review of Systems  Constitutional: Negative.   HENT: Negative.   Eyes: Negative.   Respiratory: Negative.   Cardiovascular: Negative.   Gastrointestinal: Negative.   Endocrine: Negative.   Genitourinary: Negative.   Musculoskeletal: Negative.   Skin: Negative.   Allergic/Immunologic: Negative.   Neurological: Positive for light-headedness and headaches. Negative for dizziness, tremors, seizures, syncope, facial asymmetry, speech difficulty, weakness and numbness.  Hematological: Negative.   Psychiatric/Behavioral: Negative.   All other systems reviewed and are  negative.      Objective:    Physical Exam Cardiovascular:     Rate and Rhythm: Normal rate and regular rhythm.     Pulses: Normal pulses.     Heart sounds: Normal heart sounds. No murmur. No friction rub. No gallop.   Pulmonary:     Effort: Pulmonary effort is normal. No respiratory distress.     Breath sounds: Normal breath sounds.  No stridor. No wheezing, rhonchi or rales.  Chest:     Chest wall: No tenderness.  Musculoskeletal:     Cervical back: Normal range of motion and neck supple.  Skin:    Capillary Refill: Capillary refill takes less than 2 seconds.  Neurological:     General: No focal deficit present.     Mental Status: He is oriented to person, place, and time. Mental status is at baseline.  Psychiatric:        Mood and Affect: Mood normal.        Behavior: Behavior normal.        Thought Content: Thought content normal.        Judgment: Judgment normal.     BP 137/83 (BP Location: Right Arm, Patient Position: Sitting, Cuff Size: Large)   Pulse 77   Temp 97.8 F (36.6 C) (Temporal)   Ht 5' 11"  (1.803 m)   Wt 222 lb 12.8 oz (101.1 kg)   SpO2 95%   BMI 31.07 kg/m  Wt Readings from Last 3 Encounters:  05/29/20 222 lb 12.8 oz (101.1 kg)  11/14/19 228 lb 9 oz (103.7 kg)  10/28/19 227 lb 9.6 oz (103.2 kg)    Health Maintenance Due  Topic Date Due  . COVID-19 Vaccine (1) Never done    There are no preventive care reminders to display for this patient.   Lab Results  Component Value Date   TSH 3.380 09/26/2016   Lab Results  Component Value Date   WBC 8.5 10/28/2019   HGB 15.0 10/28/2019   HCT 45.1 10/28/2019   MCV 96 10/28/2019   PLT 340 10/28/2019   Lab Results  Component Value Date   NA 140 10/28/2019   K 5.0 10/28/2019   CHLORIDE 103 10/21/2016   CO2 21 10/28/2019   GLUCOSE 87 10/28/2019   BUN 16 10/28/2019   CREATININE 1.15 10/28/2019   BILITOT 0.3 10/28/2019   ALKPHOS 56 10/28/2019   AST 22 10/28/2019   ALT 27 10/28/2019   PROT  7.6 10/28/2019   ALBUMIN 5.0 (H) 10/28/2019   CALCIUM 9.6 10/28/2019   ANIONGAP 12 05/09/2017   EGFR 82 (L) 10/21/2016   GFR 68.08 01/07/2016   Lab Results  Component Value Date   CHOL 248 (H) 07/04/2018   Lab Results  Component Value Date   HDL 48 07/04/2018   Lab Results  Component Value Date   LDLCALC 121 (H) 07/04/2018   Lab Results  Component Value Date   TRIG 394 (H) 07/04/2018   Lab Results  Component Value Date   CHOLHDL 5.2 (H) 07/04/2018   Lab Results  Component Value Date   HGBA1C 5.7 (H) 07/24/2018       Assessment & Plan:   Problem List Items Addressed This Visit      Other   Erectile dysfunction   Relevant Medications   tadalafil (CIALIS) 20 MG tablet    Other Visit Diagnoses    Near syncope    -  Primary   Relevant Medications   tadalafil (CIALIS) 20 MG tablet   Other Relevant Orders   TSH   Comprehensive metabolic panel   CBC   EKG 12-Lead   Screening for endocrine, metabolic and immunity disorder       Relevant Orders   TSH   Comprehensive metabolic panel   CBC   Hemoglobin A1c   Lipid screening       Relevant Orders   Lipid panel  Meds ordered this encounter  Medications  . tadalafil (CIALIS) 20 MG tablet    Sig: Take 0.5 tablets (10 mg total) by mouth daily as needed for erectile dysfunction.    Dispense:  6 tablet    Refill:  3   PLAN  EKG reassuring - shows some concern for mild ischemia, but no symptoms that correlate to this finding. No arrythmias   Otherwise, unremarkable exam.   Will refer to Cardiology  Patient encouraged to call clinic with any questions, comments, or concerns.  Maximiano Coss, NP

## 2020-05-30 LAB — COMPREHENSIVE METABOLIC PANEL
ALT: 34 IU/L (ref 0–44)
AST: 29 IU/L (ref 0–40)
Albumin/Globulin Ratio: 1.7 (ref 1.2–2.2)
Albumin: 4.4 g/dL (ref 3.8–4.9)
Alkaline Phosphatase: 55 IU/L (ref 48–121)
BUN/Creatinine Ratio: 13 (ref 9–20)
BUN: 12 mg/dL (ref 6–24)
Bilirubin Total: 0.5 mg/dL (ref 0.0–1.2)
CO2: 21 mmol/L (ref 20–29)
Calcium: 9.3 mg/dL (ref 8.7–10.2)
Chloride: 102 mmol/L (ref 96–106)
Creatinine, Ser: 0.91 mg/dL (ref 0.76–1.27)
GFR calc Af Amer: 110 mL/min/{1.73_m2} (ref >59)
GFR calc non Af Amer: 95 mL/min/{1.73_m2} (ref >59)
Globulin, Total: 2.6 g/dL (ref 1.5–4.5)
Glucose: 105 mg/dL — ABNORMAL HIGH (ref 65–99)
Potassium: 4.6 mmol/L (ref 3.5–5.2)
Sodium: 140 mmol/L (ref 134–144)
Total Protein: 7 g/dL (ref 6.0–8.5)

## 2020-05-30 LAB — HEMOGLOBIN A1C
Est. average glucose Bld gHb Est-mCnc: 114 mg/dL
Hgb A1c MFr Bld: 5.6 % (ref 4.8–5.6)

## 2020-05-30 LAB — CBC
Hematocrit: 40.4 % (ref 37.5–51.0)
Hemoglobin: 14.1 g/dL (ref 13.0–17.7)
MCH: 32.4 pg (ref 26.6–33.0)
MCHC: 34.9 g/dL (ref 31.5–35.7)
MCV: 93 fL (ref 79–97)
Platelets: 257 10*3/uL (ref 150–450)
RBC: 4.35 x10E6/uL (ref 4.14–5.80)
RDW: 12.6 % (ref 11.6–15.4)
WBC: 4.1 10*3/uL (ref 3.4–10.8)

## 2020-05-30 LAB — LIPID PANEL
Chol/HDL Ratio: 2.7 ratio (ref 0.0–5.0)
Cholesterol, Total: 175 mg/dL (ref 100–199)
HDL: 64 mg/dL (ref >39)
LDL Chol Calc (NIH): 86 mg/dL (ref 0–99)
Triglycerides: 145 mg/dL (ref 0–149)
VLDL Cholesterol Cal: 25 mg/dL (ref 5–40)

## 2020-05-30 LAB — TSH: TSH: 1.81 u[IU]/mL (ref 0.450–4.500)

## 2020-06-08 ENCOUNTER — Other Ambulatory Visit: Payer: Self-pay | Admitting: Family Medicine

## 2020-06-08 ENCOUNTER — Ambulatory Visit: Payer: 59 | Attending: Internal Medicine

## 2020-06-08 DIAGNOSIS — Z23 Encounter for immunization: Secondary | ICD-10-CM

## 2020-06-08 DIAGNOSIS — I10 Essential (primary) hypertension: Secondary | ICD-10-CM

## 2020-06-08 NOTE — Telephone Encounter (Signed)
Requested Prescriptions  Pending Prescriptions Disp Refills  . amLODipine (NORVASC) 10 MG tablet [Pharmacy Med Name: AMLODIPINE BESYLATE 10MG  TABLETS] 90 tablet 2    Sig: TAKE 1 TABLET(10 MG) BY MOUTH DAILY     Cardiovascular:  Calcium Channel Blockers Passed - 06/08/2020 10:16 AM      Passed - Last BP in normal range    BP Readings from Last 1 Encounters:  05/29/20 137/83         Passed - Valid encounter within last 6 months    Recent Outpatient Visits          1 week ago Near syncope   [facility], Delfino Lovett, NP   2 months ago Gastroenteritis   [facility], Lilia Argue, MD   7 months ago Abdominal pain, epigastric   [facility], Ranell Patrick, MD   9 months ago Nausea and vomiting, intractability of vomiting not specified, unspecified vomiting type   [facility], Ranell Patrick, MD   9 months ago Left hip pain   [facility], Ranell Patrick, MD      Future Appointments            In 2 weeks Carlota Raspberry Ranell Patrick, MD [facility]

## 2020-06-08 NOTE — Progress Notes (Signed)
   Covid-19 Vaccination Clinic  Name:  KEITON COSMA    MRN: [id number] DOB: 1966/10/13  06/08/2020  Mr. Agena was observed post Covid-19 immunization for 30 minutes based on pre-vaccination screening without incident. He was provided with Vaccine Information Sheet and instruction to access the V-Safe system.   Mr. Standley was instructed to call 911 with any severe reactions post vaccine: Marland Kitchen Difficulty breathing  . Swelling of face and throat  . A fast heartbeat  . A bad rash all over body  . Dizziness and weakness   Immunizations Administered    Name Date Dose VIS Date Route   JANSSEN COVID-19 VACCINE 06/08/2020 12:44 PM 0.5 mL 02/22/2020 Intramuscular   Manufacturer: [employer]   Lot: [id number]   [place]: 41740-814-48

## 2020-06-10 ENCOUNTER — Other Ambulatory Visit: Payer: Self-pay | Admitting: Family Medicine

## 2020-06-10 DIAGNOSIS — E782 Mixed hyperlipidemia: Secondary | ICD-10-CM

## 2020-06-22 ENCOUNTER — Ambulatory Visit (INDEPENDENT_AMBULATORY_CARE_PROVIDER_SITE_OTHER): Payer: 59

## 2020-06-22 ENCOUNTER — Other Ambulatory Visit: Payer: Self-pay

## 2020-06-22 ENCOUNTER — Ambulatory Visit: Payer: 59 | Admitting: Family Medicine

## 2020-06-22 ENCOUNTER — Telehealth: Payer: 59 | Admitting: Emergency Medicine

## 2020-06-22 ENCOUNTER — Encounter: Payer: Self-pay | Admitting: Family Medicine

## 2020-06-22 VITALS — BP 155/102 | HR 93 | Temp 98.5°F | Ht 71.0 in | Wt 219.0 lb

## 2020-06-22 DIAGNOSIS — E785 Hyperlipidemia, unspecified: Secondary | ICD-10-CM

## 2020-06-22 DIAGNOSIS — M25851 Other specified joint disorders, right hip: Secondary | ICD-10-CM | POA: Diagnosis not present

## 2020-06-22 DIAGNOSIS — M25551 Pain in right hip: Secondary | ICD-10-CM | POA: Diagnosis not present

## 2020-06-22 DIAGNOSIS — I10 Essential (primary) hypertension: Secondary | ICD-10-CM | POA: Diagnosis not present

## 2020-06-22 MED ORDER — HYDROCHLOROTHIAZIDE 12.5 MG PO CAPS: 12.5000 mg | ORAL_CAPSULE | Freq: Every day | ORAL | 1 refills | Status: DC

## 2020-06-22 NOTE — Patient Instructions (Addendum)
Hip pain may be related to impingement syndrome.  I will refer you back to Dr. Ninfa Linden.  Advil temporarily for now or Tylenol over-the-counter if needed.  Blood pressure at home and here is still running too high.  Add hydrochlorothiazide once per day.  Take in the morning to lessen chance of urination throughout the night.  Continue amlodipine same dose.  Recheck in 6 weeks.  Sooner if worse.   Return to the clinic or go to the nearest emergency room if any of your symptoms worsen or new symptoms occur.     If you have lab work done today you will be contacted with your lab results within the next 2 weeks.  If you have not heard from [place] then please contact us. The fastest way to get your results is to register for My Chart.   IF you received an x-ray today, you will receive an invoice from [facility]. Please contact [facility] at [phone] with questions or concerns regarding your invoice.   IF you received labwork today, you will receive an invoice from [facility]. Please contact [facility] at [phone] with questions or concerns regarding your invoice.   Our billing staff will not be able to assist you with questions regarding bills from these companies.  You will be contacted with the lab results as soon as they are available. The fastest way to get your results is to activate your My Chart account. Instructions are located on the last page of this paperwork. If you have not heard from [place] regarding the results in 2 weeks, please contact this office.

## 2020-06-22 NOTE — Progress Notes (Signed)
Subjective:  Patient ID: Blake Roberts, male    DOB: 07-23-1966  Age: 54 y.o. MRN: [id number]  CC:  Chief Complaint  Patient presents with   Follow-up    on L hip pain. Pt reports since last OV the pain in his L hip has sub sided a little, but not the Hip him has been hurting while the pt is active. pt reports he has been favoring his R side since the L hip was hurting. current pain level while seated is 0-1/10 while the pt was standing and walking back to the rm pain level was 5/10. pt reports the longewr he is on his feet the more it startes to hurt.   medication question    Pt has a question about the lipitor medication he's on.    HPI Blake Roberts presents for  Hyperlipidemia: Lipitor 20 mg daily.  Same dose for awhile.  Lab Results  Component Value Date   CHOL 175 05/29/2020   HDL 64 05/29/2020   LDLCALC 86 05/29/2020   TRIG 145 05/29/2020   CHOLHDL 2.7 05/29/2020   Lab Results  Component Value Date   ALT 34 05/29/2020   AST 29 05/29/2020   ALKPHOS 55 05/29/2020   BILITOT 0.5 05/29/2020   Hypertension: Amlodipine 10 mg daily, no missed doses.  Evaluated June 4 for his near syncope with Kathrin Ruddy.  Episodic headaches and dizziness noted at that visit.  CBC, TSH normal.  Glucose 105. possible concern EKG, was referred to cardiology.  Has not met with cardiology.  No further nearsyncope. Eating better, exercising - no CP with exercise.  Home readings: 140's/90's.  Alcohol: off for past 2 months.  No illicit drug use.  Caffeine: few diet cokes per day.   BP Readings from Last 3 Encounters:  06/22/20 (!) 155/102  05/29/20 137/83  01/21/20 (!) 158/99   Lab Results  Component Value Date   CREATININE 0.91 05/29/2020    Left hip pain  History of L side sciatica. MRI lumbar spine November 2020 with moderately advanced multiple disc and facet degeneration, spinal stenosis L3-4, mild spinal stenosis 2-3, 4-5, left-sided synovial cyst L3-4 with left lateral  recess stenosis and potential left L4 nerve root impingement.  Moderate multilevel neuroforaminal stenosis.  Treated by Dr. Ernestina Patches in January for lumbar radiculopathy with injections. Left sided symptoms improved with injection.   Now R sided pain in hip. Past 2 weeks. Twinges of pain previously, but more noticeable past 2 weeks.  No falls. No injury known, some weights and walking prior for more exercise.  No unexplained wt loss, night sweats, fevers.  Pain in front of R hip in joint with prolonged standing. No buttock or back pain. No pain seated or at rest. Some discomfort on bed.  Tx: Advil few times per day - min relief initially, less now.  No prior R hip injection or surgery. US guided L hip injection by Dr. Ninfa Linden in 08/28/19.  Wondered if pain d/t statin.    History Patient Active Problem List   Diagnosis Date Noted   Hyperlipidemia 05/10/2017   Erectile dysfunction 05/10/2017   Memory difficulty 09/26/2016   Hemochromatosis 07/11/2016   Episodic lightheadedness 12/25/2015   Anemia, unspecified 12/25/2015   Chronic left-sided low back pain with sciatica 07/15/2015   Essential hypertension 02/15/2015   Rotator cuff tear 06/09/2014   GERD (gastroesophageal reflux disease) 05/13/2014   Past Medical History:  Diagnosis Date   Acute meniscal tear of knee RIGHT KNEE  Anxiety    Arthritis RIGHT KNEE   Bulging lumbar disc L5 - S1   Depression    Hemochromatosis    Hyperlipidemia    Hypertension    Past Surgical History:  Procedure Laterality Date   KNEE ARTHROSCOPY  AGE 39 (APPROX)   RIGHT KNEE   KNEE ARTHROSCOPY  07/13/2012   Procedure: ARTHROSCOPY KNEE;  Surgeon: Magnus Sinning, MD;  Location: [facility];  Service: Orthopedics;  Laterality: Right;  WITH PARTIAL MEDIAL MENISECTOMY AND REMOVAL OF LOOSE BODIES   torn rotator cuff     Allergies  Allergen Reactions   Ace Inhibitors Other (See Comments)    ANGIOEDEMA OF JOINTS-  SEVERE   Prior to Admission medications   Medication Sig Start Date End Date Taking? Authorizing Provider  amLODipine (NORVASC) 10 MG tablet TAKE 1 TABLET(10 MG) BY MOUTH DAILY 06/08/20  Yes Wendie Agreste, MD  atorvastatin (LIPITOR) 20 MG tablet TAKE 1 TABLET(20 MG) BY MOUTH DAILY 06/10/20  Yes Wendie Agreste, MD  ibuprofen (ADVIL,MOTRIN) 200 MG tablet Take 200 mg by mouth every 6 (six) hours as needed.   Yes [provider]  omeprazole (PRILOSEC) 20 MG capsule TAKE 1 CAPSULE(20 MG) BY MOUTH TWICE DAILY BEFORE A MEAL 04/10/20  Yes Wendie Agreste, MD  tadalafil (CIALIS) 20 MG tablet Take 0.5 tablets (10 mg total) by mouth daily as needed for erectile dysfunction. 05/29/20  Yes Maximiano Coss, NP   Social History   Socioeconomic History   Marital status: Legally Separated    Spouse name: Not on file   Number of children: 0   Years of education: BA   Highest education level: Not on file  Occupational History   Occupation: paramedic  Tobacco Use   Smoking status: Never Smoker   Smokeless tobacco: Never Used  Substance and Sexual Activity   Alcohol use: Yes    Comment: 4 beers a month,states heavy beer drinker in younger years   Drug use: No   Sexual activity: Yes  Other Topics Concern   Not on file  Social History Narrative   Lives at home alone   Separated, 2 step-daughters   Right-handed   Caffeine: 2 diet Cokes per day   Social Determinants of Health   Financial Resource Strain:    Difficulty of Paying Living Expenses:   Food Insecurity:    Worried About Charity fundraiser in the Last Year:    Arboriculturist in the Last Year:   Transportation Needs:    Film/video editor (Medical):    Lack of Transportation (Non-Medical):   Physical Activity:    Days of Exercise per Week:    Minutes of Exercise per Session:   Stress:    Feeling of Stress :   Social Connections:    Frequency of Communication with Friends and Family:     Frequency of Social Gatherings with Friends and Family:    Attends Religious Services:    Active Member of Clubs or Organizations:    Attends Music therapist:    Marital Status:   Intimate Partner Violence:    Fear of Current or Ex-Partner:    Emotionally Abused:    Physically Abused:    Sexually Abused:     Review of Systems  Constitutional: Negative for fatigue and unexpected weight change.  Eyes: Negative for visual disturbance.  Respiratory: Negative for cough, chest tightness and shortness of breath.   Cardiovascular: Negative for chest pain, palpitations  and leg swelling.  Gastrointestinal: Negative for abdominal pain and blood in stool.  Neurological: Negative for dizziness, light-headedness and headaches.    Objective:   Vitals:   06/22/20 1436 06/22/20 1445 06/22/20 1548  BP: (!) 157/89 (!) 155/102 (!) 155/102  Pulse: 93    Temp: 98.5 F (36.9 C)    TempSrc: Temporal    SpO2: 95%    Weight: 219 lb (99.3 kg)    Height: 5' 11"  (1.803 m)       Physical Exam Vitals reviewed.  Constitutional:      Appearance: He is well-developed.  HENT:     Head: Normocephalic and atraumatic.  Eyes:     Pupils: Pupils are equal, round, and reactive to light.  Neck:     Vascular: No carotid bruit or JVD.  Cardiovascular:     Rate and Rhythm: Normal rate and regular rhythm.     Heart sounds: Normal heart sounds. No murmur heard.   Pulmonary:     Effort: Pulmonary effort is normal.     Breath sounds: Normal breath sounds. No rales.  Musculoskeletal:     Comments: Lumbar spine, slight discomfort into the anterior right hip with forward flexion, but otherwise does not reproduce pain, no radicular pain down the side of the leg with lumbar range of motion.  Negative seated straight leg raise  Right hip discomfort with full flexion, abduction, external rotation, but worse with internal rotation.  No focal bony tenderness on exterior hip, trochanteric bursa.    Skin:    General: Skin is warm and dry.  Neurological:     Mental Status: He is alert and oriented to person, place, and time.    DG HIP UNILAT W OR W/O PELVIS 2-3 VIEWS RIGHT  Result Date: 06/22/2020 CLINICAL DATA:  Right hip pain EXAM: DG HIP (WITH OR WITHOUT PELVIS) 2-3V RIGHT COMPARISON:  08/23/2008 FINDINGS: SI joints are non widened. The pubic symphysis and rami are intact. No fracture or malalignment. Joint space is relatively maintained. Small osseous bumps at the femoral head neck junction, can be seen in association with impingement. IMPRESSION: No acute osseous abnormality. Electronically Signed   By: Donavan Foil M.D.   On: 06/22/2020 15:50     Assessment & Plan:   SHELLHAMMER is a 54 y.o. male . Essential hypertension - Plan: hydrochlorothiazide (MICROZIDE) 12.5 MG capsule  -Decreased control, will add HCTZ, continue amlodipine.  Recheck 6 weeks.  Right hip pain - Plan: DG HIP UNILAT W OR W/O PELVIS 2-3 VIEWS RIGHT, Ambulatory referral to Orthopedic Surgery Hip impingement syndrome, right - Plan: Ambulatory referral to Orthopedic Surgery  -Possible femoral acetabular impingement.  Symptomatic care with clinic Advil for now, refer to orthopedist.  Hyperlipidemia, unspecified hyperlipidemia type  -Continued on same dose Lipitor, unlikely cause of hip pain.  Meds ordered this encounter  Medications   hydrochlorothiazide (MICROZIDE) 12.5 MG capsule    Sig: Take 1 capsule (12.5 mg total) by mouth daily.    Dispense:  30 capsule    Refill:  1   Patient Instructions   Hip pain may be related to impingement syndrome.  I will refer you back to Dr. Ninfa Linden.  Advil temporarily for now or Tylenol over-the-counter if needed.  Blood pressure at home and here is still running too high.  Add hydrochlorothiazide once per day.  Take in the morning to lessen chance of urination throughout the night.  Continue amlodipine same dose.  Recheck in 6 weeks.  Sooner if  worse.   Return  to the clinic or go to the nearest emergency room if any of your symptoms worsen or new symptoms occur.     If you have lab work done today you will be contacted with your lab results within the next 2 weeks.  If you have not heard from [place] then please contact us. The fastest way to get your results is to register for My Chart.   IF you received an x-ray today, you will receive an invoice from [facility]. Please contact [facility] at [phone] with questions or concerns regarding your invoice.   IF you received labwork today, you will receive an invoice from [facility]. Please contact [facility] at [phone] with questions or concerns regarding your invoice.   Our billing staff will not be able to assist you with questions regarding bills from these companies.  You will be contacted with the lab results as soon as they are available. The fastest way to get your results is to activate your My Chart account. Instructions are located on the last page of this paperwork. If you have not heard from [place] regarding the results in 2 weeks, please contact this office.         Signed, Merri Ray, MD [facility]

## 2020-06-23 ENCOUNTER — Encounter: Payer: Self-pay | Admitting: Family Medicine

## 2020-06-25 ENCOUNTER — Ambulatory Visit: Payer: 59 | Admitting: Family Medicine

## 2020-06-30 ENCOUNTER — Other Ambulatory Visit: Payer: Self-pay

## 2020-06-30 ENCOUNTER — Ambulatory Visit: Payer: 59 | Admitting: Orthopaedic Surgery

## 2020-06-30 DIAGNOSIS — M25551 Pain in right hip: Secondary | ICD-10-CM

## 2020-06-30 NOTE — Progress Notes (Signed)
Office Visit Note   Patient: Blake Roberts           Date of Birth: 12/20/1966           MRN: [id number] Visit Date: 06/30/2020              Requested by: Wendie Agreste, MD [address] [place],  [place] [address] PCP: Wendie Agreste, MD   Assessment & Plan: Visit Diagnoses:  1. Pain in right hip     Plan: I went over the patient's clinical exam and x-ray findings with him.  Given the fact that he is having some pain in the groin on the right side I have recommended a appointment be established with Dr. Junius Roads in our office.  I would like Dr. Junius Roads to evaluate his right hip with ultrasound to see if there is the potential for fluid collection or labral tear or other abnormalities including any pathology of the iliopsoas tendon.  I would then recommend at least an intra-articular injection in the right hip joint under ultrasound with steroid.  This would be an appropriate injection versus potentially the iliopsoas tendon itself.  We will see with Dr. Junius Roads thanks and then after that injection Dr. Junius Roads can get him back with me at least about 2 weeks later.  All questions and concerns were addressed.  The patient agrees with this plan.  Follow-Up Instructions: No follow-ups on file.   Orders:  No orders of the defined types were placed in this encounter.  No orders of the defined types were placed in this encounter.     Procedures: No procedures performed   Clinical Data: No additional findings.   Subjective: Chief Complaint  Patient presents with  . Right Hip - Pain  The patient is well-known to me.  He comes in today as referral for questionable right hip impingement syndrome.  He says his right hip pain is slowly getting worse if he has been standing for long period time occasionally gets a stabbing type of pain in his right groin area.  His left knee has become bothersome which he feels is from overcompensation.  He denies any injury to his right hip.  There are  x-rays on the canopy system for me to review.  He is never had right hip surgery.  He is asymptomatic on the left hip.  HPI  Review of Systems He currently denies any headache, chest pain, shortness of breath, fever, chills, nausea, vomiting  Objective: Vital Signs: There were no vitals taken for this visit.  Physical Exam He is alert and orient x3 and in no acute distress Ortho Exam Examination of his right and left hip shows fluid range of motion of both hips and no significant signs of impingement. Specialty Comments:  No specialty comments available.  Imaging: No results found. X-rays on the canopy system show well-maintained joint space bilaterally.  There is slight flattening of the femoral heads bilaterally which can be indicative of impingement.  PMFS History: Patient Active Problem List   Diagnosis Date Noted  . Hyperlipidemia 05/10/2017  . Erectile dysfunction 05/10/2017  . Memory difficulty 09/26/2016  . Hemochromatosis 07/11/2016  . Episodic lightheadedness 12/25/2015  . Anemia, unspecified 12/25/2015  . Chronic left-sided low back pain with sciatica 07/15/2015  . Essential hypertension 02/15/2015  . Rotator cuff tear 06/09/2014  . GERD (gastroesophageal reflux disease) 05/13/2014   Past Medical History:  Diagnosis Date  . Acute meniscal tear of knee RIGHT KNEE  .  Anxiety   . Arthritis RIGHT KNEE  . Bulging lumbar disc L5 - S1  . Depression   . Hemochromatosis   . Hyperlipidemia   . Hypertension     Family History  Problem Relation Age of Onset  . Breast cancer Mother   . Lung cancer Mother   . Diabetes Father        adopted father; no history of biological father  . Heart disease Father   . Hyperlipidemia Father   . Hypertension Father   . Kidney disease Father   . Heart Problems Brother        has pacemaker also handicapped  . Pancreatic cancer Maternal Uncle        age early 54's  . Colon cancer Neg Hx     Past Surgical History:  Procedure  Laterality Date  . KNEE ARTHROSCOPY  AGE 29 (APPROX)   RIGHT KNEE  . KNEE ARTHROSCOPY  07/13/2012   Procedure: ARTHROSCOPY KNEE;  Surgeon: Magnus Sinning, MD;  Location: [facility];  Service: Orthopedics;  Laterality: Right;  WITH PARTIAL MEDIAL MENISECTOMY AND REMOVAL OF LOOSE BODIES  . torn rotator cuff     Social History   Occupational History  . Occupation: paramedic  Tobacco Use  . Smoking status: Never Smoker  . Smokeless tobacco: Never Used  Substance and Sexual Activity  . Alcohol use: Yes    Comment: 4 beers a month,states heavy beer drinker in younger years  . Drug use: No  . Sexual activity: Yes

## 2020-07-03 ENCOUNTER — Encounter: Payer: Self-pay | Admitting: Family Medicine

## 2020-07-03 ENCOUNTER — Other Ambulatory Visit: Payer: Self-pay

## 2020-07-03 ENCOUNTER — Ambulatory Visit: Payer: 59 | Admitting: Family Medicine

## 2020-07-03 ENCOUNTER — Ambulatory Visit: Payer: Self-pay

## 2020-07-03 DIAGNOSIS — M25551 Pain in right hip: Secondary | ICD-10-CM | POA: Diagnosis not present

## 2020-07-03 NOTE — Progress Notes (Signed)
Office Visit Note   Patient: Blake Roberts           Date of Birth: [birth year]           MRN: [id number] Visit Date: 07/03/2020 Requested by: Wendie Agreste, MD [address] [place],  [place] [address] PCP: Wendie Agreste, MD  Subjective: Chief Complaint  Patient presents with  . Right Hip - Pain    HPI: He is here with right hip pain.  He did well with left hip injection, still not having any significant pain.  He has a history of gout.  He does not require any daily treatment for that, but did well with colchicine during an attack in his foot.               ROS:   All other systems were reviewed and are negative.  Objective: Vital Signs: There were no vitals taken for this visit.  Physical Exam:  General:  Alert and oriented, in no acute distress. Pulm:  Breathing unlabored. Psy:  Normal mood, congruent affect.  Right hip: Pain and limited range of motion with passive flexion and internal rotation.   Imaging: US Guided Needle Placement - No Linked Charges  Result Date: 07/03/2020 Limited diagnostic ultrasound right hip: He has a small joint effusion with evidence of crystal deposits in the anterior joint capsule.  I believe he has an anterior labrum tear.  The iliopsoas tendon is thickened with calcified appearance.  No hyperemia on power Doppler imaging. Ultrasound-guided right hip injection: After sterile prep with Betadine, injected 8 cc 1% lidocaine without epinephrine and 40 mg methylprednisolone using a 22-gauge spinal needle, passing the needle through the iliofemoral ligament into the femoral head/neck junction.  Injectate seen filling the joint capsule.    Assessment & Plan: 1.  Chronic right hip pain probably due to osteoarthritis, with evidence of gout involvement. -Elected to inject intra-articularly with cortisone today.  Could contemplate dextrose prolotherapy in the future if needed.  Follow-up with Dr. Ninfa Linden as scheduled.     Procedures: No  procedures performed  No notes on file     PMFS History: Patient Active Problem List   Diagnosis Date Noted  . Hyperlipidemia 05/10/2017  . Erectile dysfunction 05/10/2017  . Memory difficulty 09/26/2016  . Hemochromatosis 07/11/2016  . Episodic lightheadedness 12/25/2015  . Anemia, unspecified 12/25/2015  . Chronic left-sided low back pain with sciatica 07/15/2015  . Essential hypertension 02/15/2015  . Rotator cuff tear 06/09/2014  . GERD (gastroesophageal reflux disease) 05/13/2014   Past Medical History:  Diagnosis Date  . Acute meniscal tear of knee RIGHT KNEE  . Anxiety   . Arthritis RIGHT KNEE  . Bulging lumbar disc L5 - S1  . Depression   . Hemochromatosis   . Hyperlipidemia   . Hypertension     Family History  Problem Relation Age of Onset  . Breast cancer Mother   . Lung cancer Mother   . Diabetes Father        adopted father; no history of biological father  . Heart disease Father   . Hyperlipidemia Father   . Hypertension Father   . Kidney disease Father   . Heart Problems Brother        has pacemaker also handicapped  . Pancreatic cancer Maternal Uncle        age early 1's  . Colon cancer Neg Hx     Past Surgical History:  Procedure Laterality Date  . KNEE ARTHROSCOPY  AGE 52 (APPROX)   RIGHT KNEE  . KNEE ARTHROSCOPY  07/13/2012   Procedure: ARTHROSCOPY KNEE;  Surgeon: Magnus Sinning, MD;  Location: [facility];  Service: Orthopedics;  Laterality: Right;  WITH PARTIAL MEDIAL MENISECTOMY AND REMOVAL OF LOOSE BODIES  . torn rotator cuff     Social History   Occupational History  . Occupation: paramedic  Tobacco Use  . Smoking status: Never Smoker  . Smokeless tobacco: Never Used  Substance and Sexual Activity  . Alcohol use: Yes    Comment: 4 beers a month,states heavy beer drinker in younger years  . Drug use: No  . Sexual activity: Yes

## 2020-07-05 ENCOUNTER — Encounter: Payer: Self-pay | Admitting: Registered Nurse

## 2020-08-03 ENCOUNTER — Ambulatory Visit: Payer: 59 | Admitting: Family Medicine

## 2020-08-04 ENCOUNTER — Encounter: Payer: Self-pay | Admitting: Family Medicine

## 2020-08-20 ENCOUNTER — Other Ambulatory Visit: Payer: Self-pay | Admitting: Family Medicine

## 2020-08-20 DIAGNOSIS — I10 Essential (primary) hypertension: Secondary | ICD-10-CM

## 2020-08-20 NOTE — Telephone Encounter (Signed)
Requested Prescriptions  Pending Prescriptions Disp Refills  . hydrochlorothiazide (MICROZIDE) 12.5 MG capsule [Pharmacy Med Name: HYDROCHLOROTHIAZIDE 12.5MG  CAPSULES] 90 capsule 1    Sig: TAKE 1 CAPSULE(12.5 MG) BY MOUTH DAILY     Cardiovascular: Diuretics - Thiazide Failed - 08/20/2020  3:44 AM      Failed - Last BP in normal range    BP Readings from Last 1 Encounters:  06/22/20 (!) 155/102         Passed - Ca in normal range and within 360 days    Calcium  Date Value Ref Range Status  05/29/2020 9.3 8.7 - 10.2 mg/dL Final  10/21/2016 8.9 8.4 - 10.4 mg/dL Final         Passed - Cr in normal range and within 360 days    Creatinine  Date Value Ref Range Status  10/21/2016 1.1 0.7 - 1.3 mg/dL Final   Creatinine, Ser  Date Value Ref Range Status  05/29/2020 0.91 0.76 - 1.27 mg/dL Final         Passed - K in normal range and within 360 days    Potassium  Date Value Ref Range Status  05/29/2020 4.6 3.5 - 5.2 mmol/L Final  10/21/2016 3.9 3.5 - 5.1 mEq/L Final         Passed - Na in normal range and within 360 days    Sodium  Date Value Ref Range Status  05/29/2020 140 134 - 144 mmol/L Final  10/21/2016 134 (L) 136 - 145 mEq/L Final         Passed - Valid encounter within last 6 months    Recent Outpatient Visits          1 month ago Essential hypertension   [facility], Ranell Patrick, MD   2 months ago Near syncope   [facility], Delfino Lovett, NP   4 months ago Gastroenteritis   [facility], Lilia Argue, MD   9 months ago Abdominal pain, epigastric   [facility], Ranell Patrick, MD   11 months ago Nausea and vomiting, intractability of vomiting not specified, unspecified vomiting type   [facility], Ranell Patrick, MD

## 2020-09-03 ENCOUNTER — Ambulatory Visit: Payer: 59 | Admitting: Family Medicine

## 2020-09-03 ENCOUNTER — Encounter: Payer: Self-pay | Admitting: Family Medicine

## 2020-09-03 ENCOUNTER — Other Ambulatory Visit: Payer: Self-pay

## 2020-09-03 VITALS — BP 140/82 | HR 82 | Temp 97.8°F | Ht 71.0 in | Wt 221.0 lb

## 2020-09-03 DIAGNOSIS — Z23 Encounter for immunization: Secondary | ICD-10-CM

## 2020-09-03 DIAGNOSIS — M25552 Pain in left hip: Secondary | ICD-10-CM

## 2020-09-03 DIAGNOSIS — M25512 Pain in left shoulder: Secondary | ICD-10-CM

## 2020-09-03 MED ORDER — MELOXICAM 7.5 MG PO TABS: 7.5000 mg | ORAL_TABLET | Freq: Every day | ORAL | 0 refills | Status: DC

## 2020-09-03 NOTE — Patient Instructions (Addendum)
Try meloxicam 2 pills initially then 1 pill/day to see if that calms down shoulder and hip issues.  If not improved in the next few weeks I would recommend following up with Dr. Ninfa Linden or Dr. Junius Roads.  Return to the clinic or go to the nearest emergency room if any of your symptoms worsen or new symptoms occur.  If you have lab work done today you will be contacted with your lab results within the next 2 weeks.  If you have not heard from [place] then please contact us. The fastest way to get your results is to register for My Chart.   IF you received an x-ray today, you will receive an invoice from [facility]. Please contact [facility] at [phone] with questions or concerns regarding your invoice.   IF you received labwork today, you will receive an invoice from [facility]. Please contact [facility] at [phone] with questions or concerns regarding your invoice.   Our billing staff will not be able to assist you with questions regarding bills from these companies.  You will be contacted with the lab results as soon as they are available. The fastest way to get your results is to activate your My Chart account. Instructions are located on the last page of this paperwork. If you have not heard from [place] regarding the results in 2 weeks, please contact this office.

## 2020-09-03 NOTE — Progress Notes (Signed)
Subjective:  Patient ID: Blake Roberts, male    DOB: 03/26/1966  Age: 54 y.o. MRN: [id number]  CC:  Chief Complaint  Patient presents with  . Shoulder Pain    left should pain xseveral weeks worsen over the past weekend. no know injury. Hx of right shoulder rotative cuff injury and this feels like that  . Hip Pain    patient stated still having right hip pain and would like to discuss going on some NSAIDs for the pain    HPI Blake Roberts presents for   L shoulder pain: 4- 5 weeks. No known injury. Had been doing usual workouts, including bench press. No limitation in movement, but hurts to turn steering wheel. Worse past weekend. Thought slept on it funny. Some work around house, no new activities. Still able to move, just hurts. No neck pain. R han dominant.  Tx: ice, ibuprofen, bengay sports cream.  R rotator cuff injury in 2015, treated by orthopedist at [facility].  L shoulder XR 07/24/18:  IMPRESSION: No acute finding. Degenerative spurring at the acromioclavicular joint.   R hip pain: It appears this has been evaluated by orthopedics, Dr. Ninfa Linden on July 6, then Dr. Junius Roads on July 9.  Ultrasound-guided injection with 40 mg methylprednisone at that time.  Thought to be chronic right hip pain due to osteoarthritis, evidence of gout involvement.  Option of dextrose prolotherapy in the future if needed.  3 of right hip on June 28 with small osseous bumps at the femoral head neck junction, joint space relatively maintained.  No acute osseous abnormality. Hip pain improved after injection. Now past few weeks having pain at the end of the workday.  Tx: otc ibuprofen/acetaminophen.   No hx of PUD/GI bleeding.  Lab Results  Component Value Date   CREATININE 0.91 05/29/2020     History Patient Active Problem List   Diagnosis Date Noted  . Hyperlipidemia 05/10/2017  . Erectile dysfunction 05/10/2017  . Memory difficulty 09/26/2016  . Hemochromatosis 07/11/2016  . Episodic  lightheadedness 12/25/2015  . Anemia, unspecified 12/25/2015  . Chronic left-sided low back pain with sciatica 07/15/2015  . Essential hypertension 02/15/2015  . Rotator cuff tear 06/09/2014  . GERD (gastroesophageal reflux disease) 05/13/2014   Past Medical History:  Diagnosis Date  . Acute meniscal tear of knee RIGHT KNEE  . Anxiety   . Arthritis RIGHT KNEE  . Bulging lumbar disc L5 - S1  . Depression   . Hemochromatosis   . Hyperlipidemia   . Hypertension    Past Surgical History:  Procedure Laterality Date  . KNEE ARTHROSCOPY  AGE 72 (APPROX)   RIGHT KNEE  . KNEE ARTHROSCOPY  07/13/2012   Procedure: ARTHROSCOPY KNEE;  Surgeon: Magnus Sinning, MD;  Location: [place];  Service: Orthopedics;  Laterality: Right;  WITH PARTIAL MEDIAL MENISECTOMY AND REMOVAL OF LOOSE BODIES  . torn rotator cuff     Allergies  Allergen Reactions  . Ace Inhibitors Other (See Comments)    ANGIOEDEMA OF JOINTS- SEVERE   Prior to Admission medications   Medication Sig Start Date End Date Taking? Authorizing Provider  amLODipine (NORVASC) 10 MG tablet TAKE 1 TABLET(10 MG) BY MOUTH DAILY 06/08/20  Yes Wendie Agreste, MD  atorvastatin (LIPITOR) 20 MG tablet TAKE 1 TABLET(20 MG) BY MOUTH DAILY 06/10/20  Yes Wendie Agreste, MD  hydrochlorothiazide (MICROZIDE) 12.5 MG capsule TAKE 1 CAPSULE(12.5 MG) BY MOUTH DAILY 08/20/20  Yes Wendie Agreste, MD  ibuprofen (  ADVIL,MOTRIN) 200 MG tablet Take 200 mg by mouth every 6 (six) hours as needed.   Yes [provider]  omeprazole (PRILOSEC) 20 MG capsule TAKE 1 CAPSULE(20 MG) BY MOUTH TWICE DAILY BEFORE A MEAL 04/10/20  Yes Wendie Agreste, MD  tadalafil (CIALIS) 20 MG tablet Take 0.5 tablets (10 mg total) by mouth daily as needed for erectile dysfunction. 05/29/20  Yes Maximiano Coss, NP   Social History   Socioeconomic History  . Marital status: Legally Separated    Spouse name: Not on file  . Number of children: 0  .  Years of education: BA  . Highest education level: Not on file  Occupational History  . Occupation: paramedic  Tobacco Use  . Smoking status: Never Smoker  . Smokeless tobacco: Never Used  Substance and Sexual Activity  . Alcohol use: Yes    Comment: 4 beers a month,states heavy beer drinker in younger years  . Drug use: No  . Sexual activity: Yes  Other Topics Concern  . Not on file  Social History Narrative   Lives at home alone   Separated, 2 step-daughters   Right-handed   Caffeine: 2 diet Cokes per day   Social Determinants of Health   Financial Resource Strain:   . Difficulty of Paying Living Expenses: Not on file  Food Insecurity:   . Worried About Charity fundraiser in the Last Year: Not on file  . Ran Out of Food in the Last Year: Not on file  Transportation Needs:   . Lack of Transportation (Medical): Not on file  . Lack of Transportation (Non-Medical): Not on file  Physical Activity:   . Days of Exercise per Week: Not on file  . Minutes of Exercise per Session: Not on file  Stress:   . Feeling of Stress : Not on file  Social Connections:   . Frequency of Communication with Friends and Family: Not on file  . Frequency of Social Gatherings with Friends and Family: Not on file  . Attends Religious Services: Not on file  . Active Member of Clubs or Organizations: Not on file  . Attends Archivist Meetings: Not on file  . Marital Status: Not on file  Intimate Partner Violence:   . Fear of Current or Ex-Partner: Not on file  . Emotionally Abused: Not on file  . Physically Abused: Not on file  . Sexually Abused: Not on file    Review of Systems  Constitutional: Negative for diaphoresis, fever and unexpected weight change.     Objective:   Vitals:   09/03/20 1613  BP: 140/82  Pulse: 82  Temp: 97.8 F (36.6 C)  TempSrc: Temporal  SpO2: 97%  Weight: 221 lb (100.2 kg)  Height: 5\' 11"  (1.803 m)     Physical Exam Constitutional:       General: He is not in acute distress.    Appearance: He is well-developed.  HENT:     Head: Normocephalic and atraumatic.  Cardiovascular:     Rate and Rhythm: Normal rate.  Pulmonary:     Effort: Pulmonary effort is normal.  Musculoskeletal:     Comments: C-spine, intact range of motion without reproduction of pain symptoms.  No focal bony tenderness.  Left shoulder: [place], AC, clavicle nontender, full range of motion.  Full rotator cuff strength.  Minimal discomfort with O'Brien's, negative Neer, Hawkins.  Pain reproduced with external rotation and slight abduction at 90 degrees.  Pain at posterior lateral shoulder.  No focal tenderness to this area.  Neurovascular intact distally.  Right hip: Pain free ROM, no bony ttp.  Negative FABER, negative FADIR.  Ambulates without assistive device or difficulty.  Neurological:     Mental Status: He is alert and oriented to person, place, and time.    Assessment & Plan:  Blake Roberts is a 53 y.o. male . Acute pain of left shoulder - Plan: meloxicam (MOBIC) 7.5 MG tablet  -Rotator cuff testing reassuring.  No known injury.  Potentially could have component of impingement but overall reassuring exam.  Trial of meloxicam, potential long-term side effects/risk discussed.  If not improving next few weeks recommended follow-up with his orthopedist.  Need for prophylactic vaccination and inoculation against influenza - Plan: Flu Vaccine QUAD 6+ mos PF IM (Fluarix Quad PF)  Left hip pain - Plan: meloxicam (MOBIC) 7.5 MG tablet  -Recurrence of symptoms at night.  Meloxicam as above, if not improved, follow-up with orthopedist as planned.    Meds ordered this encounter  Medications  . meloxicam (MOBIC) 7.5 MG tablet    Sig: Take 1-2 tablets (7.5-15 mg total) by mouth daily.    Dispense:  30 tablet    Refill:  0   Patient Instructions   Try meloxicam 2 pills initially then 1 pill/day to see if that calms down shoulder and hip issues.  If not  improved in the next few weeks I would recommend following up with Dr. Ninfa Linden or Dr. Junius Roads.  Return to the clinic or go to the nearest emergency room if any of your symptoms worsen or new symptoms occur.  If you have lab work done today you will be contacted with your lab results within the next 2 weeks.  If you have not heard from [place] then please contact us. The fastest way to get your results is to register for My Chart.   IF you received an x-ray today, you will receive an invoice from [facility]. Please contact [facility] at [phone] with questions or concerns regarding your invoice.   IF you received labwork today, you will receive an invoice from [facility]. Please contact [facility] at [phone] with questions or concerns regarding your invoice.   Our billing staff will not be able to assist you with questions regarding bills from these companies.  You will be contacted with the lab results as soon as they are available. The fastest way to get your results is to activate your My Chart account. Instructions are located on the last page of this paperwork. If you have not heard from [place] regarding the results in 2 weeks, please contact this office.          Signed, Merri Ray, MD [facility]

## 2020-09-14 ENCOUNTER — Ambulatory Visit: Payer: 59 | Admitting: Physician Assistant

## 2020-09-14 ENCOUNTER — Encounter: Payer: Self-pay | Admitting: Physician Assistant

## 2020-09-14 ENCOUNTER — Ambulatory Visit: Payer: Self-pay

## 2020-09-14 DIAGNOSIS — M25552 Pain in left hip: Secondary | ICD-10-CM

## 2020-09-14 DIAGNOSIS — M25512 Pain in left shoulder: Secondary | ICD-10-CM | POA: Diagnosis not present

## 2020-09-14 MED ORDER — LIDOCAINE HCL 1 % IJ SOLN
3.0000 mL | INTRAMUSCULAR | Status: AC | PRN
  Administered 2020-09-14: 3 mL

## 2020-09-14 MED ORDER — METHYLPREDNISOLONE ACETATE 40 MG/ML IJ SUSP
40.0000 mg | INTRAMUSCULAR | Status: AC | PRN
  Administered 2020-09-14: 40 mg via INTRA_ARTICULAR

## 2020-09-14 MED ORDER — MELOXICAM 7.5 MG PO TABS: 7.5000 mg | ORAL_TABLET | Freq: Every day | ORAL | 2 refills | Status: DC

## 2020-09-14 NOTE — Progress Notes (Signed)
Office Visit Note   Patient: Blake Roberts           Date of Birth: Feb 28, 1966           MRN: [id number] Visit Date: 09/14/2020              Requested by: Wendie Agreste, MD [address] [place],  [place] [address] PCP: Wendie Agreste, MD   Assessment & Plan: Visit Diagnoses:  1. Acute pain of left shoulder     Plan: Patient will wall crawls and forward flexion exercises which I did like for him to do over the next 2 weeks.  We will continue to take his low back and did send in some refills on the Mobic as he found this beneficial.  Discussed exercises that may actually aggravate the shoulder particularly the acromioclavicular joint, which she has arthritis.  Exercises such as bench press military press and push-ups could aggravate his condition.   Follow-Up Instructions: Return in about 2 weeks (around 09/28/2020).   Orders:  Orders Placed This Encounter  Procedures  . Large Joint Inj  . XR Shoulder Left   No orders of the defined types were placed in this encounter.     Procedures: Large Joint Inj: L subacromial bursa on 09/14/2020 2:31 PM Indications: pain Details: 22 G 1.5 in needle, superior approach  Arthrogram: No  Medications: 3 mL lidocaine 1 %; 40 mg methylPREDNISolone acetate 40 MG/ML Outcome: tolerated well, no immediate complications Procedure, treatment alternatives, risks and benefits explained, specific risks discussed. Consent was given by the patient. Immediately prior to procedure a time out was called to verify the correct patient, procedure, equipment, support staff and site/side marked as required. Patient was prepped and draped in the usual sterile fashion.       Clinical Data: No additional findings.   Subjective: Chief Complaint  Patient presents with  . Right Hip - Follow-up  . Left Shoulder - Pain    HPI Blake Roberts is a 54 year old male well-known to Dr. Ninfa Linden service comes in today due to left shoulder pain that  began about 2 weeks ago no particular injury.  Seen by his primary care physician who placed him on some meloxicam which has been helpful.  Mainly has pain whenever he is turning a steering well with the left arm.  Sharp stabbing pain.  Denies any numbness tingling down the arm he has had some numbness in his fingertips from time to time but has been ongoing for years. Review of Systems Negative for fevers chills shortness breath chest pain  Objective: Vital Signs: There were no vitals taken for this visit.  Physical Exam Constitutional:      Appearance: He is not ill-appearing or diaphoretic.  Pulmonary:     Effort: Pulmonary effort is normal.  Neurological:     Mental Status: He is alert and oriented to person, place, and time.  Psychiatric:        Mood and Affect: Mood normal.     Ortho Exam Biceps strength bilaterally 5 out of 5 against resistance.  Triceps strength 5 out of 5 against resistance.  External/internal rotation 5-5 strength against resistance bilateral shoulders.  Impingement testing positive on the left negative on the right.  Abduction of the left arm across the chest causes no significant pain.  Liftoff test is negative bilaterally.  Empty can test negative bilaterally. Specialty Comments:  No specialty comments available.  Imaging: XR Shoulder Left  Result Date: 09/14/2020 Left shoulder 3 views: No acute fracture.  Shoulder is well located.  Subacromial space well-maintained.  Severe AC joint arthritic changes.  Glenohumeral joint well-maintained.    PMFS History: Patient Active Problem List   Diagnosis Date Noted  . Hyperlipidemia 05/10/2017  . Erectile dysfunction 05/10/2017  . Memory difficulty 09/26/2016  . Hemochromatosis 07/11/2016  . Episodic lightheadedness 12/25/2015  . Anemia, unspecified 12/25/2015  . Chronic left-sided low back pain with sciatica 07/15/2015  . Essential hypertension 02/15/2015  . Rotator cuff tear 06/09/2014  . GERD  (gastroesophageal reflux disease) 05/13/2014   Past Medical History:  Diagnosis Date  . Acute meniscal tear of knee RIGHT KNEE  . Anxiety   . Arthritis RIGHT KNEE  . Bulging lumbar disc L5 - S1  . Depression   . Hemochromatosis   . Hyperlipidemia   . Hypertension     Family History  Problem Relation Age of Onset  . Breast cancer Mother   . Lung cancer Mother   . Diabetes Father        adopted father; no history of biological father  . Heart disease Father   . Hyperlipidemia Father   . Hypertension Father   . Kidney disease Father   . Heart Problems Brother        has pacemaker also handicapped  . Pancreatic cancer Maternal Uncle        age early 20's  . Colon cancer Neg Hx     Past Surgical History:  Procedure Laterality Date  . KNEE ARTHROSCOPY  AGE 54 (APPROX)   RIGHT KNEE  . KNEE ARTHROSCOPY  07/13/2012   Procedure: ARTHROSCOPY KNEE;  Surgeon: Magnus Sinning, MD;  Location: [place];  Service: Orthopedics;  Laterality: Right;  WITH PARTIAL MEDIAL MENISECTOMY AND REMOVAL OF LOOSE BODIES  . torn rotator cuff     Social History   Occupational History  . Occupation: paramedic  Tobacco Use  . Smoking status: Never Smoker  . Smokeless tobacco: Never Used  Substance and Sexual Activity  . Alcohol use: Yes    Comment: 4 beers a month,states heavy beer drinker in younger years  . Drug use: No  . Sexual activity: Yes

## 2020-09-14 NOTE — Addendum Note (Signed)
Addended by: Erskine Emery on: 09/14/2020 03:22 PM   Modules accepted: Orders

## 2020-10-28 ENCOUNTER — Telehealth: Payer: Self-pay

## 2020-10-28 ENCOUNTER — Ambulatory Visit (INDEPENDENT_AMBULATORY_CARE_PROVIDER_SITE_OTHER): Payer: 59 | Admitting: Orthopaedic Surgery

## 2020-10-28 ENCOUNTER — Encounter: Payer: Self-pay | Admitting: Orthopaedic Surgery

## 2020-10-28 DIAGNOSIS — M25552 Pain in left hip: Secondary | ICD-10-CM | POA: Diagnosis not present

## 2020-10-28 DIAGNOSIS — M25512 Pain in left shoulder: Secondary | ICD-10-CM

## 2020-10-28 MED ORDER — MELOXICAM 7.5 MG PO TABS: 7.5000 mg | ORAL_TABLET | Freq: Every day | ORAL | 2 refills | Status: DC

## 2020-10-28 NOTE — Telephone Encounter (Signed)
Can you call and make him an appt in about 2 months with [facility] for a right hip injection under ultrasound?

## 2020-10-28 NOTE — Progress Notes (Signed)
Office Visit Note   Patient: Blake Roberts           Date of Birth: 05/08/1966           MRN: [id number] Visit Date: 10/28/2020              Requested by: Wendie Agreste, MD [address] [place],  [place] [address] PCP: Wendie Agreste, MD   Assessment & Plan: Visit Diagnoses:  1. Acute pain of left shoulder   2. Left hip pain     Plan: Patient would like to continue with conservative treatment which will consist of periodic intra-articular injections of the right hip (every 6 months and NSAIDs.  We will set him up for an injection in the right hip with Dr. Junius Roads in 2 months.  Refill on his Mobic was given.  He will follow up with [place] in 3 months to see how he is doing.  Questions encouraged and answered.  Follow-Up Instructions: Return in about 3 months (around 01/28/2021).   Orders:  No orders of the defined types were placed in this encounter.  Meds ordered this encounter  Medications  . meloxicam (MOBIC) 7.5 MG tablet    Sig: Take 1-2 tablets (7.5-15 mg total) by mouth daily.    Dispense:  30 tablet    Refill:  2      Procedures: No procedures performed   Clinical Data: No additional findings.   Subjective: Chief Complaint  Patient presents with  . Right Hip - Pain    HPI Blake Roberts returns today due to right hip pain.  He states the right hip is better status post injection with Dr. Junius Roads intra-articularly on 07/03/2020.  States he does not have constant pain but he can have a pretty bad pain by the end of 12-hour shifts.  He works as an Public relations account executive.  He does have some trouble walking due to the pain in the hip at times.  He feels that the Mobic in the past is helped.  He also feels that his pain is not severe enough that he would want to do any type of surgical intervention at this time. The right hip ultrasound-guided intra-articular injection by Dr. Junius Roads shows an effusion with crystals consistent with gout also possible anterior labral tear.  He has impingement  findings on his radiographs and some cystic changes within the femoral head consistent with early arthritic changes. Review of Systems Denies any fevers chills shortness of breath chest pain.  Objective: Vital Signs: There were no vitals taken for this visit.  Physical Exam General: Well-developed well-nourished male in no acute distress Psych: Alert and orient x3 Ortho Exam Right hip he has no pain with external rotation slight pain with internal rotation slight guarding.  Walks without any assistive device. Specialty Comments:  No specialty comments available.  Imaging: No results found.   PMFS History: Patient Active Problem List   Diagnosis Date Noted  . Hyperlipidemia 05/10/2017  . Erectile dysfunction 05/10/2017  . Memory difficulty 09/26/2016  . Hemochromatosis 07/11/2016  . Episodic lightheadedness 12/25/2015  . Anemia, unspecified 12/25/2015  . Chronic left-sided low back pain with sciatica 07/15/2015  . Essential hypertension 02/15/2015  . Rotator cuff tear 06/09/2014  . GERD (gastroesophageal reflux disease) 05/13/2014   Past Medical History:  Diagnosis Date  . Acute meniscal tear of knee RIGHT KNEE  . Anxiety   . Arthritis RIGHT KNEE  . Bulging lumbar disc L5 - S1  . Depression   .  Hemochromatosis   . Hyperlipidemia   . Hypertension     Family History  Problem Relation Age of Onset  . Breast cancer Mother   . Lung cancer Mother   . Diabetes Father        adopted father; no history of biological father  . Heart disease Father   . Hyperlipidemia Father   . Hypertension Father   . Kidney disease Father   . Heart Problems Brother        has pacemaker also handicapped  . Pancreatic cancer Maternal Uncle        age early 68's  . Colon cancer Neg Hx     Past Surgical History:  Procedure Laterality Date  . KNEE ARTHROSCOPY  AGE 38 (APPROX)   RIGHT KNEE  . KNEE ARTHROSCOPY  07/13/2012   Procedure: ARTHROSCOPY KNEE;  Surgeon: Magnus Sinning, MD;   Location: [place];  Service: Orthopedics;  Laterality: Right;  WITH PARTIAL MEDIAL MENISECTOMY AND REMOVAL OF LOOSE BODIES  . torn rotator cuff     Social History   Occupational History  . Occupation: paramedic  Tobacco Use  . Smoking status: Never Smoker  . Smokeless tobacco: Never Used  Substance and Sexual Activity  . Alcohol use: Yes    Comment: 4 beers a month,states heavy beer drinker in younger years  . Drug use: No  . Sexual activity: Yes

## 2021-01-01 ENCOUNTER — Ambulatory Visit: Payer: 59 | Admitting: Family Medicine

## 2021-01-04 ENCOUNTER — Ambulatory Visit: Payer: Self-pay

## 2021-01-04 ENCOUNTER — Ambulatory Visit (INDEPENDENT_AMBULATORY_CARE_PROVIDER_SITE_OTHER): Payer: 59 | Admitting: Family Medicine

## 2021-01-04 DIAGNOSIS — M25551 Pain in right hip: Secondary | ICD-10-CM

## 2021-01-04 NOTE — Progress Notes (Signed)
Subjective: Patient is here for ultrasound-guided intra-articular right hip injection.   Previous injection helped, but his pain is very sporadic.  Today he is actually feeling pretty good.  Objective: No significant pain with passive internal rotation today.  Procedure: Ultrasound guided injection is preferred based studies that show increased duration, increased effect, greater accuracy, decreased procedural pain, increased response rate, and decreased cost with ultrasound guided versus blind injection.   Verbal informed consent obtained.  Time-out conducted.  Noted no overlying erythema, induration, or other signs of local infection. Ultrasound-guided right hip injection: After sterile prep with Betadine, injected 4 cc 0.25% bupivacaine without epinephrine and 6 mg betamethasone using a 22-gauge spinal needle, passing the needle through the iliofemoral ligament into the femoral head/neck junction.  Injectate seen filling the joint capsule.  Follow-up with Dr. Ninfa Linden as scheduled.

## 2021-01-28 ENCOUNTER — Encounter: Payer: Self-pay | Admitting: Orthopaedic Surgery

## 2021-01-28 ENCOUNTER — Ambulatory Visit: Payer: 59 | Admitting: Orthopaedic Surgery

## 2021-01-28 DIAGNOSIS — G8929 Other chronic pain: Secondary | ICD-10-CM | POA: Diagnosis not present

## 2021-01-28 DIAGNOSIS — M25512 Pain in left shoulder: Secondary | ICD-10-CM | POA: Diagnosis not present

## 2021-01-28 MED ORDER — LIDOCAINE HCL 1 % IJ SOLN
3.0000 mL | INTRAMUSCULAR | Status: AC | PRN
  Administered 2021-01-28: 3 mL

## 2021-01-28 MED ORDER — METHYLPREDNISOLONE ACETATE 40 MG/ML IJ SUSP
40.0000 mg | INTRAMUSCULAR | Status: AC | PRN
  Administered 2021-01-28: 40 mg via INTRA_ARTICULAR

## 2021-01-28 NOTE — Progress Notes (Signed)
Office Visit Note   Patient: Blake Roberts           Date of Birth: Oct 08, 1966           MRN: [id number] Visit Date: 01/28/2021              Requested by: Wendie Agreste, MD [address] [place],  [place] [address] PCP: Wendie Agreste, MD   Assessment & Plan: Visit Diagnoses:  1. Chronic left shoulder pain     Plan: Per the patient's wishes I did provide a steroid injection in his left shoulder subacromial space. At some point he may benefit from arthroscopic intervention and [facility] joint resection. Eventually he may end up needing hip replacement surgery. There is no harm in trying over-the-counter lumbar corset braces for back support. He should also go to [employer] for assessment of shoe wear for the possibility of custom inserts. I do feel that looking at his shoes he pronates with his gait. All questions and concerns were answered and addressed. Follow-up can be as needed.  Follow-Up Instructions: Return if symptoms worsen or fail to improve.   Orders:  No orders of the defined types were placed in this encounter.  No orders of the defined types were placed in this encounter.     Procedures: Large Joint Inj: L subacromial bursa on 01/28/2021 5:05 PM Indications: pain and diagnostic evaluation Details: 22 G 1.5 in needle  Arthrogram: No  Medications: 3 mL lidocaine 1 %; 40 mg methylPREDNISolone acetate 40 MG/ML Outcome: tolerated well, no immediate complications Procedure, treatment alternatives, risks and benefits explained, specific risks discussed. Consent was given by the patient. Immediately prior to procedure a time out was called to verify the correct patient, procedure, equipment, support staff and site/side marked as required. Patient was prepped and draped in the usual sterile fashion.       Clinical Data: No additional findings.   Subjective: Chief Complaint  Patient presents with  . Right Hip - Follow-up  The patient is well-known to me. 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 He comes in today requesting steroid injection in his left shoulder. Was seen in for this shoulder before. 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 He also has some known degenerative changes in the lumbar spine and known degenerative changes in both hips. He recently had an intra-articular right hip injection by Dr. Junius Roads. He still states that he is not ready for hip replacement surgery as of yet. He asked appropriate questions about shoe wear as well as arch supports and back brace. Again, he requested a steroid injection in his left shoulder today. Previous x-rays of left shoulder do show significant arthritis at the [facility] joint.  HPI  Review of Systems Today there is no listed headache, chest pain, shortness of breath, fever, chills, nausea, vomiting  Objective: Vital Signs: There were no vitals taken for this visit.  Physical Exam He is alert and orient x3 and in no acute distress Ortho Exam Examination of his left shoulder does show pain at the [facility] joint and positive Neer and Hawkins signs and some grinding in this area. His rotator cuff feels intact and has got good strength. Both hips have stiffness with rotation. Both feet appear wide but have normal arches. He is able to raise up on his toes easily. Both feet are ligamentously stable and neurovascularly intact. Specialty Comments:  No specialty comments available.  Imaging: No results found.   PMFS History: Patient Active Problem List   Diagnosis Date Noted  . Hyperlipidemia 05/10/2017  . Erectile dysfunction 05/10/2017  . Memory difficulty 09/26/2016  . Hemochromatosis 07/11/2016  . Episodic lightheadedness 12/25/2015  . Anemia, unspecified 12/25/2015  . Chronic left-sided low back pain with sciatica 07/15/2015  . Essential hypertension 02/15/2015  . Rotator cuff tear 06/09/2014  . GERD (gastroesophageal reflux disease) 05/13/2014   Past Medical  History:  Diagnosis Date  . Acute meniscal tear of knee RIGHT KNEE  . Anxiety   . Arthritis RIGHT KNEE  . Bulging lumbar disc L5 - S1  . Depression   . Hemochromatosis   . Hyperlipidemia   . Hypertension     Family History  Problem Relation Age of Onset  . Breast cancer Mother   . Lung cancer Mother   . Diabetes Father        adopted father; no history of biological father  . Heart disease Father   . Hyperlipidemia Father   . Hypertension Father   . Kidney disease Father   . Heart Problems Brother        has pacemaker also handicapped  . Pancreatic cancer Maternal Uncle        age early 57's  . Colon cancer Neg Hx     Past Surgical History:  Procedure Laterality Date  . KNEE ARTHROSCOPY  AGE 58 (APPROX)   RIGHT KNEE  . KNEE ARTHROSCOPY  07/13/2012   Procedure: ARTHROSCOPY KNEE;  Surgeon: Magnus Sinning, MD;  Location: [facility];  Service: Orthopedics;  Laterality: Right;  WITH PARTIAL MEDIAL MENISECTOMY AND REMOVAL OF LOOSE BODIES  . torn rotator cuff     Social History   Occupational History  . Occupation: paramedic  Tobacco Use  . Smoking status: Never Smoker  . Smokeless tobacco: Never Used  Substance and Sexual Activity  . Alcohol use: Yes    Comment: 4 beers a month,states heavy beer drinker in younger years  . Drug use: No  . Sexual activity: Yes

## 2021-02-14 ENCOUNTER — Other Ambulatory Visit: Payer: Self-pay | Admitting: Family Medicine

## 2021-02-14 DIAGNOSIS — I10 Essential (primary) hypertension: Secondary | ICD-10-CM

## 2021-04-01 ENCOUNTER — Encounter: Payer: Self-pay | Admitting: Family Medicine

## 2021-04-01 ENCOUNTER — Ambulatory Visit: Payer: 59 | Admitting: Family Medicine

## 2021-04-01 ENCOUNTER — Other Ambulatory Visit: Payer: Self-pay

## 2021-04-01 DIAGNOSIS — G8929 Other chronic pain: Secondary | ICD-10-CM

## 2021-04-01 DIAGNOSIS — M25512 Pain in left shoulder: Secondary | ICD-10-CM | POA: Diagnosis not present

## 2021-04-01 NOTE — Progress Notes (Signed)
Office Visit Note   Patient: Blake Roberts           Date of Birth: 1966/08/27           MRN: [id number] Visit Date: 04/01/2021 Requested by: Wendie Agreste, MD [address] [place],  [place] [address] PCP: Wendie Agreste, MD  Subjective: Chief Complaint  Patient presents with  . Left Shoulder - Pain    Pain x months. The subacromial cortisone injection given by Dr. Ninfa Linden in February helped some, but continues to have pain. You voltaren gel and ibuprofen otc - helps relieve the pain some. Patient is concerned he may have an impingement or a tear in the shoulder.    HPI: He is here with left shoulder pain.  Pain for months, had a subacromial injection in February which gave some improvement but not complete relief.  He has a history of complete rotator cuff tear on the right requiring repair in 2015.  It was a very rough recovery but he is now pain-free on that side.  He wonders whether he might have torn his left shoulder although he does not remember a specific moment of injury.               ROS:   All other systems were reviewed and are negative.  Objective: Vital Signs: There were no vitals taken for this visit.  Physical Exam:  General:  Alert and oriented, in no acute distress. Pulm:  Breathing unlabored. Psy:  Normal mood, congruent affect.  Left shoulder: There is slight decreased external rotation motion with some pain at the extreme.  Internal rotation motion is good.  Isometric rotator cuff strength is 5/5 throughout with no significant pain.  Speeds test is negative.  No tenderness at the [facility] joint.  No palpable crepitus.   Imaging: No results found.  Assessment & Plan: 1.  Chronic left shoulder pain, possible impingement versus partial rotator cuff tear. -He would very much like to avoid surgery if possible.  We will try home exercises for the next 6 to 8 weeks.  If his pain persist, then he will contact me and I will order  MRI.     Procedures: No procedures performed        PMFS History: Patient Active Problem List   Diagnosis Date Noted  . Hyperlipidemia 05/10/2017  . Erectile dysfunction 05/10/2017  . Memory difficulty 09/26/2016  . Hemochromatosis 07/11/2016  . Episodic lightheadedness 12/25/2015  . Anemia, unspecified 12/25/2015  . Chronic left-sided low back pain with sciatica 07/15/2015  . Essential hypertension 02/15/2015  . Rotator cuff tear 06/09/2014  . GERD (gastroesophageal reflux disease) 05/13/2014   Past Medical History:  Diagnosis Date  . Acute meniscal tear of knee RIGHT KNEE  . Anxiety   . Arthritis RIGHT KNEE  . Bulging lumbar disc L5 - S1  . Depression   . Hemochromatosis   . Hyperlipidemia   . Hypertension     Family History  Problem Relation Age of Onset  . Breast cancer Mother   . Lung cancer Mother   . Diabetes Father        adopted father; no history of biological father  . Heart disease Father   . Hyperlipidemia Father   . Hypertension Father   . Kidney disease Father   . Heart Problems Brother        has pacemaker also handicapped  . Pancreatic cancer Maternal Uncle  age early 14's  . Colon cancer Neg Hx     Past Surgical History:  Procedure Laterality Date  . KNEE ARTHROSCOPY  AGE 1 (APPROX)   RIGHT KNEE  . KNEE ARTHROSCOPY  07/13/2012   Procedure: ARTHROSCOPY KNEE;  Surgeon: Magnus Sinning, MD;  Location: [facility];  Service: Orthopedics;  Laterality: Right;  WITH PARTIAL MEDIAL MENISECTOMY AND REMOVAL OF LOOSE BODIES  . torn rotator cuff     Social History   Occupational History  . Occupation: paramedic  Tobacco Use  . Smoking status: Never Smoker  . Smokeless tobacco: Never Used  Substance and Sexual Activity  . Alcohol use: Yes    Comment: 4 beers a month,states heavy beer drinker in younger years  . Drug use: No  . Sexual activity: Yes

## 2021-05-07 ENCOUNTER — Telehealth: Payer: Self-pay | Admitting: Family Medicine

## 2021-05-07 DIAGNOSIS — G8929 Other chronic pain: Secondary | ICD-10-CM

## 2021-05-07 DIAGNOSIS — M25512 Pain in left shoulder: Secondary | ICD-10-CM

## 2021-05-07 NOTE — Telephone Encounter (Signed)
Please advise 

## 2021-05-07 NOTE — Telephone Encounter (Signed)
MRI ordered

## 2021-05-07 NOTE — Telephone Encounter (Signed)
Pt called and was wondering if he needed to make an appt or if you could go ahead and send a referral for an MRI? He states PT is not helping with his shoulder. 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

## 2021-05-07 NOTE — Telephone Encounter (Signed)
I called and left voice mail that the MRI has been ordered (no ov needed beforehand).

## 2021-05-15 ENCOUNTER — Other Ambulatory Visit: Payer: Self-pay | Admitting: Family Medicine

## 2021-05-15 DIAGNOSIS — I10 Essential (primary) hypertension: Secondary | ICD-10-CM

## 2021-05-15 DIAGNOSIS — E782 Mixed hyperlipidemia: Secondary | ICD-10-CM

## 2021-05-23 ENCOUNTER — Ambulatory Visit
Admission: RE | Admit: 2021-05-23 | Discharge: 2021-05-23 | Disposition: A | Discharge status: Home / Self Care | Payer: 59 | Source: Ambulatory Visit | Attending: Family Medicine | Admitting: Family Medicine

## 2021-05-23 ENCOUNTER — Other Ambulatory Visit: Payer: Self-pay

## 2021-05-23 DIAGNOSIS — G8929 Other chronic pain: Secondary | ICD-10-CM

## 2021-05-25 ENCOUNTER — Telehealth: Payer: Self-pay | Admitting: Family Medicine

## 2021-05-25 NOTE — Telephone Encounter (Signed)
I called the patient with his results. He said his pain has gotten markedly worse over the last 2 weeks and he is having trouble with his work and with sleeping. I saw Dr. Trevor Mace first available is 6/07--- I told the patient I would check with you to see if there is possibility of getting him in this week (Dr. Junius Roads sent you a message, also).

## 2021-05-25 NOTE — Telephone Encounter (Signed)
Patient called needing to know the results of his MRI?  Patient said to please leave a message if he can not answer his phone. The number to contact patient is [phone]

## 2021-05-25 NOTE — Telephone Encounter (Signed)
LMOM for patient letting him know that unfortunately we don't have any available appointments until next week; to give [place] a call back to schedule next week

## 2021-05-25 NOTE — Telephone Encounter (Signed)
Left shoulder MRI scan shows severe rotator cuff tendinopathy with partial tearing.  Presuming you're still having trouble, I recommend consulting with Dr. Ninfa Linden for possible surgery.

## 2021-06-02 ENCOUNTER — Other Ambulatory Visit: Payer: Self-pay

## 2021-06-02 ENCOUNTER — Ambulatory Visit (INDEPENDENT_AMBULATORY_CARE_PROVIDER_SITE_OTHER): Payer: 59 | Admitting: Orthopaedic Surgery

## 2021-06-02 DIAGNOSIS — M25512 Pain in left shoulder: Secondary | ICD-10-CM

## 2021-06-02 DIAGNOSIS — G8929 Other chronic pain: Secondary | ICD-10-CM | POA: Diagnosis not present

## 2021-06-02 DIAGNOSIS — M7542 Impingement syndrome of left shoulder: Secondary | ICD-10-CM | POA: Diagnosis not present

## 2021-06-02 NOTE — Progress Notes (Signed)
The patient is well-known to me.  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  He has been dealing with left shoulder pain for quite some time now.  He has had injections in that left shoulder and is gotten to where his left shoulder pain is consistent and daily is becoming more stiff.  A MRI of the left shoulder showed severe tendinosis of the rotator cuff with both the supraspinatus and infraspinatus as well as subscapularis being significantly thickened.  There is also thickened intra-articular portion of the biceps tendon and severe AC joint arthritic changes.  There are some mild to moderate arthritic changes of the glenoid face but no evidence of full-thickness cartilage loss seen on the MRI or on plain films.  At this point he is interested in the operative intervention with an arthroscopy for his left shoulder and I agree with this.  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  Examination of his left shoulder shows full range of motion but it is definitely stiff and painful especially in the subacromial outlet and the [facility] joint.  He has positive Neer and Hawkins signs as well.  The rotator cuff itself feels strong.  At this point I do agree with a left shoulder arthroscopy.  I showed him a shoulder model and explained in detail what a left shoulder arthroscopy involves.  I described the interoperative and postoperative course and the risk and benefits of surgery.  This can be done as an outpatient and then we will see him back at 1 week postoperative for suture removal.  Again, I would have him out of work probably 6 to 8 weeks to allow for full recovery before getting back to the heavy work that he does.  All questions and concerns were answered and addressed.  We will work on getting this scheduled.

## 2021-06-08 ENCOUNTER — Telehealth: Payer: Self-pay

## 2021-06-08 ENCOUNTER — Other Ambulatory Visit: Payer: Self-pay | Admitting: Orthopaedic Surgery

## 2021-06-08 MED ORDER — TIZANIDINE HCL 4 MG PO TABS: 4.0000 mg | ORAL_TABLET | Freq: Three times a day (TID) | ORAL | 0 refills | Status: DC | PRN

## 2021-06-08 NOTE — Telephone Encounter (Signed)
Patient called he is requesting a rx for a muscle relaxer to be sent to the pharmacy call [phone]

## 2021-06-08 NOTE — Telephone Encounter (Signed)
Called pt and advised. Pt would like to know when he will be called for surgery. Please advise.

## 2021-06-08 NOTE — Telephone Encounter (Signed)
Please advise 

## 2021-06-09 NOTE — Telephone Encounter (Signed)
I called patient and left voice mail for return call. °

## 2021-06-17 ENCOUNTER — Other Ambulatory Visit: Payer: Self-pay | Admitting: Orthopaedic Surgery

## 2021-06-17 DIAGNOSIS — M7542 Impingement syndrome of left shoulder: Secondary | ICD-10-CM

## 2021-06-17 MED ORDER — OXYCODONE HCL 5 MG PO TABS: 5.0000 mg | ORAL_TABLET | Freq: Four times a day (QID) | ORAL | 0 refills | Status: DC | PRN

## 2021-06-24 ENCOUNTER — Encounter: Payer: Self-pay | Admitting: Orthopaedic Surgery

## 2021-06-24 ENCOUNTER — Ambulatory Visit (INDEPENDENT_AMBULATORY_CARE_PROVIDER_SITE_OTHER): Payer: 59 | Admitting: Orthopaedic Surgery

## 2021-06-24 ENCOUNTER — Other Ambulatory Visit: Payer: Self-pay

## 2021-06-24 DIAGNOSIS — G8929 Other chronic pain: Secondary | ICD-10-CM

## 2021-06-24 DIAGNOSIS — M25512 Pain in left shoulder: Secondary | ICD-10-CM

## 2021-06-24 DIAGNOSIS — M7542 Impingement syndrome of left shoulder: Secondary | ICD-10-CM

## 2021-06-24 DIAGNOSIS — Z9889 Other specified postprocedural states: Secondary | ICD-10-CM

## 2021-06-24 MED ORDER — OXYCODONE HCL 5 MG PO TABS: 5.0000 mg | ORAL_TABLET | Freq: Four times a day (QID) | ORAL | 0 refills | Status: DC | PRN

## 2021-06-24 NOTE — Progress Notes (Signed)
The patient comes in today status post a left shoulder arthroscopy 1 week ago.  We found significant impingement in that shoulder with tendinosis of the rotator cuff and spurring underneath the acromion.  He also had significant cartilage loss on the glenoid face and synovitis throughout the glenohumeral joint area.  We performed an extensive debridement.  This is his first follow-up since then.  He reports that he is doing well.  He is only 55 years old and he works with a Neurosurgeon.  This means he does do a lot of heavy work.  On exam the suture lines look good so the sutures were removed from the left shoulder.  His axillary nerve function is intact.  There is no evidence of infection.  I did go over his arthroscopy pictures with him.  He would definitely benefit from physical therapy to improve the function of his left shoulder.  I have shown him some exercises to try as well while we set him up for outpatient physical therapy.  He agrees with this treatment plan.  I would like to reevaluate him in 4 weeks to see how he is doing overall.  I did send in a refill for oxycodone which he is already using sparingly.  All questions and concerns were answered and addressed.

## 2021-06-30 ENCOUNTER — Encounter: Payer: Self-pay | Admitting: Family Medicine

## 2021-06-30 ENCOUNTER — Ambulatory Visit: Payer: 59 | Admitting: Family Medicine

## 2021-06-30 ENCOUNTER — Ambulatory Visit: Payer: 59 | Admitting: Physical Therapy

## 2021-06-30 ENCOUNTER — Other Ambulatory Visit: Payer: Self-pay

## 2021-06-30 VITALS — BP 160/100 | HR 110 | Temp 98.1°F | Wt 224.4 lb

## 2021-06-30 DIAGNOSIS — M25552 Pain in left hip: Secondary | ICD-10-CM

## 2021-06-30 DIAGNOSIS — R5383 Other fatigue: Secondary | ICD-10-CM | POA: Diagnosis not present

## 2021-06-30 DIAGNOSIS — D126 Benign neoplasm of colon, unspecified: Secondary | ICD-10-CM | POA: Diagnosis not present

## 2021-06-30 DIAGNOSIS — I10 Essential (primary) hypertension: Secondary | ICD-10-CM | POA: Diagnosis not present

## 2021-06-30 DIAGNOSIS — R634 Abnormal weight loss: Secondary | ICD-10-CM | POA: Diagnosis not present

## 2021-06-30 DIAGNOSIS — M6281 Muscle weakness (generalized): Secondary | ICD-10-CM

## 2021-06-30 DIAGNOSIS — R600 Localized edema: Secondary | ICD-10-CM | POA: Diagnosis not present

## 2021-06-30 DIAGNOSIS — M25551 Pain in right hip: Secondary | ICD-10-CM | POA: Diagnosis not present

## 2021-06-30 DIAGNOSIS — R000 Tachycardia, unspecified: Secondary | ICD-10-CM

## 2021-06-30 DIAGNOSIS — M25512 Pain in left shoulder: Secondary | ICD-10-CM | POA: Diagnosis not present

## 2021-06-30 DIAGNOSIS — R630 Anorexia: Secondary | ICD-10-CM

## 2021-06-30 NOTE — Therapy (Signed)
[facility] [address] [place], [place], [address] Phone: [phone]   Fax:  [phone]  Physical Therapy Evaluation  Patient Details  Name: Blake Roberts MRN: [id number] Date of Birth: 28-Mar-1966 Referring Provider (PT): Mcarthur Rossetti, MD   Encounter Date: 06/30/2021   PT End of Session - 06/30/21 1656     Visit Number 1    Number of Visits 15    Date for PT Re-Evaluation 08/25/21    Authorization Type U[employer]    PT Start Time 1600    PT Stop Time 9528    PT Time Calculation (min) 46 min    Activity Tolerance Patient tolerated treatment well    Behavior During Therapy WFL for tasks assessed/performed             Past Medical History:  Diagnosis Date   Acute meniscal tear of knee RIGHT KNEE   Anxiety    Arthritis RIGHT KNEE   Bulging lumbar disc L5 - S1   Depression    Hemochromatosis    Hyperlipidemia    Hypertension     Past Surgical History:  Procedure Laterality Date   KNEE ARTHROSCOPY  AGE 55 (APPROX)   RIGHT KNEE   KNEE ARTHROSCOPY  07/13/2012   Procedure: ARTHROSCOPY KNEE;  Surgeon: Magnus Sinning, MD;  Location: [facility];  Service: Orthopedics;  Laterality: Right;  WITH PARTIAL MEDIAL MENISECTOMY AND REMOVAL OF LOOSE BODIES   torn rotator cuff      There were no vitals filed for this visit.    Subjective Assessment - 06/30/21 1605     Subjective Status post arthroscopy of left shoulder with extensive debriedement. He relays surgery was 06/17/21. He is having a lot of pain overall if he tries to use his arm. He is wrote out of work until at least 07/27/21. He works in EMS    Pertinent History Rt RTC repair 2015    Patient Stated Goals reduce pain and improve function    Currently in Pain? Yes    Pain Score 6     Pain Location Shoulder    Pain Orientation Left    Pain Descriptors / Indicators Dull    Pain Type Surgical pain    Pain Radiating Towards denies N/T in his arm    Pain  Onset 1 to 4 weeks ago    Pain Frequency Intermittent    Aggravating Factors  reaching or any use of arm    Pain Relieving Factors rest                [facility] PT Assessment - 06/30/21 0001       Assessment   Medical Diagnosis Status post arthroscopy of left shoulder with extensive debriedement.    Referring Provider (PT) Mcarthur Rossetti, MD    Onset Date/Surgical Date 06/17/21    Hand Dominance Right    Next MD Visit 07/22/21      Balance Screen   Has the patient fallen in the past 6 months No    Has the patient had a decrease in activity level because of a fear of falling?  No    Is the patient reluctant to leave their home because of a fear of falling?  No      Home Ec[employer] residence      Prior Function   Level of Independence Independent    Vocation Full time employment    Vocation Requirements EMS  Leisure strength train, read, go out with friends      Cognition   Overall Cognitive Status Within Functional Limits for tasks assessed      Observation/Other Assessments   Focus on Therapeutic Outcomes (FOTO)  50%, goal 71%      Observation/Other Assessments-Edema    Edema --   moderate edema in left shoulder     ROM / Strength   AROM / PROM / Strength AROM;PROM;Strength      AROM   AROM Assessment Site Shoulder    Right/Left Shoulder Left    Left Shoulder Flexion 90 Degrees    Left Shoulder ABduction 70 Degrees    Left Shoulder Internal Rotation --   Lt iliac crest   Left Shoulder External Rotation --   to ear     PROM   PROM Assessment Site Shoulder    Right/Left Shoulder Left    Left Shoulder Flexion 120 Degrees    Left Shoulder ABduction 95 Degrees    Left Shoulder Internal Rotation 40 Degrees    Left Shoulder External Rotation 50 Degrees      Strength   Overall Strength Comments 2/5 MMT grossly left shoulder                        Objective measurements completed on examination: See above  findings.       [facility] Adult PT Treatment/Exercise - 06/30/21 0001       Exercises   Exercises Shoulder      Shoulder Exercises: Supine   External Rotation AAROM;Left;10 reps    Flexion AAROM;Left;10 reps    ABduction AAROM;Left;10 reps      Shoulder Exercises: Seated   Other Seated Exercises gentle isometrics 5 sec X10 for IR,ER,flexion,abd      Modalities   Modalities Vasopneumatic      Vasopneumatic   Number Minutes Vasopneumatic  10 minutes    Vasopnuematic Location  Shoulder    Vasopneumatic Pressure Medium    Vasopneumatic Temperature  34      Manual Therapy   Manual therapy comments Lt shoulder PROM all planes to tolerance                    PT Education - 06/30/21 1656     Education Details HEP,POC,healing time expected    Person(s) Educated Patient    Methods Explanation;Demonstration;Verbal cues;Handout    Comprehension Verbalized understanding;Returned demonstration              PT Short Term Goals - 06/30/21 1701       PT SHORT TERM GOAL #1   Title Pt will be I and compliant with HEP    Time 4    Period Weeks    Status New    Target Date 07/28/21               PT Long Term Goals - 06/30/21 1701       PT LONG TERM GOAL #1   Title Pt will improve FOTO to 71% functional score (target for all goals is 8 weeks)    Time 8    Period Weeks    Status New    Target Date 08/25/21      PT LONG TERM GOAL #2   Title Pt will improve Lt shoulder ROM to WNL.    Status New      PT LONG TERM GOAL #3   Title Pt will improve Lt shoulder strength to 5/5  MMT to improve function.    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    Status New                    Plan - 06/30/21 1657     Clinical Impression Statement Pt presents to PT Status post arthroscopy of left shoulder with extensive debriedement on 06/17/21. He is doing  quite well to only be 2 weeks post op. He will benefit from skilled PT to address his deficits in Lt shoulder ROM,strength,pain, edema, and to improve functional use of Lt upper extremity.    Examination-Activity Limitations Carry;Lift;Sleep;Reach Overhead    Examination-Participation Restrictions Cleaning;Driving;Laundry;Yard Work;Occupation    Stability/Clinical Decision Making Stable/Uncomplicated    Clinical Decision Making Low    Rehab Potential Good    PT Frequency 2x / week   1-2   PT Duration 8 weeks    PT Treatment/Interventions Electrical Stimulation;Cryotherapy;Iontophoresis 4mg /ml Dexamethasone;Moist Heat;Ultrasound;Therapeutic activities;Therapeutic exercise;Neuromuscular re-education;Patient/family education;Manual techniques;Passive range of motion;Dry needling;Joint Manipulations;Vasopneumatic Device;Taping    PT Next Visit Plan improve AAROM and decrease pain    PT Home Exercise Plan Access Code: [email or web address]    Consulted and Agree with Plan of Care Patient             Patient will benefit from skilled therapeutic intervention in order to improve the following deficits and impairments:  Decreased activity tolerance, Decreased range of motion, Decreased strength, Increased edema, Increased fascial restricitons, Increased muscle spasms, Impaired flexibility, Impaired UE functional use  Visit Diagnosis: Acute pain of left shoulder  Muscle weakness (generalized)  Localized edema     Problem List Patient Active Problem List   Diagnosis Date Noted   Hyperlipidemia 05/10/2017   Erectile dysfunction 05/10/2017   Memory difficulty 09/26/2016   Hemochromatosis 07/11/2016   Episodic lightheadedness 12/25/2015   Anemia, unspecified 12/25/2015   Chronic left-sided low back pain with sciatica 07/15/2015   Essential hypertension 02/15/2015   Rotator cuff tear 06/09/2014   GERD (gastroesophageal reflux disease) 05/13/2014    Silvestre Mesi 06/30/2021, 5:04 PM  [facility] Physical Therapy [address] [place], [place], [address] Phone: [phone]   Fax:  [phone]  Name: Blake Roberts MRN: [id number] Date of Birth: 1966-06-23

## 2021-06-30 NOTE — Progress Notes (Signed)
Subjective:  Patient ID: Blake Roberts, male    DOB: [birth year]  Age: 55 y.o. MRN: [id number]  CC:  Chief Complaint  Patient presents with   Hip Pain    Bilateral, is seeing Dr. Karolee Ohs for previous shoulder surgery 2 weeks ago.  Has noticed increase in loss appetite and weight loss Extreme fatigue     HPI Blake Roberts presents for   Multiple concerns as above. Shoulder arthroscopy in June by Dr. Ninfa Linden.  Impingement with tendinosis of rotator cuff and spurring underneath the acromion.  Debridement performed.  Suture lines were improving, sutures removed, axillary nerve function intact and no evidence of infection at his postop visit June 30.  Plan for outpatient physical therapy.  Prescription for oxycodone 5 mg every 6 hours as needed for #30, filled on June 30, previous prescription for #5 filled on June 23 per CSRS. Has not been using oxycodone - did not take any of the new rx. Has PT for left shoulder today.   Has been treated by orthopedics, Dr. Junius Roads, for chronic hip pain previously including with injections. No acute changes in hip pain, some relief with prior injections. Feels like more constant. Has not discussed with ortho. No recent falls, sore to stand for a short period of time. Out on short term disability for shoulder.   Has been experiencing increased fatigue, weight loss - 10 pounds since surgery ([phone])Decreased appetite - past week. No abd pain. No fever, no cough, no increased shoulder pain or redness. Ho change in hip pain past 2 weeks.  No dysuria.  No dark stools or blood in stools.  Drinking fluids, but not sure he is drinking enough.  No chest pain, no palpitations.  ER visit in 2/22. Negative troponins, plan for outpatient cardiology eval - was not performed. No recurrence of chest pain. Able ot exercise without CP prior to surgery.  Colonoscopy with 6 polyps (4 adenomas), diverticulosis and internal hemorrhoids on 01/18/16. 3 year repeat recommended.   No change in thirst/urination or blurry vision.   No recent alcohol.  Did have a few drinks after his surgery but no heavy use of alcohol . He did not take his blood pressure medication today, off his usual routine.  Misses approximately 1 or 2 doses per week. Lab Results  Component Value Date   WBC 4.1 05/29/2020   HGB 14.1 05/29/2020   HCT 40.4 05/29/2020   MCV 93 05/29/2020   PLT 257 05/29/2020   Lab Results  Component Value Date   NA 140 05/29/2020   K 4.6 05/29/2020   CL 102 05/29/2020   CO2 21 05/29/2020        History Patient Active Problem List   Diagnosis Date Noted   Hyperlipidemia 05/10/2017   Erectile dysfunction 05/10/2017   Memory difficulty 09/26/2016   Hemochromatosis 07/11/2016   Episodic lightheadedness 12/25/2015   Anemia, unspecified 12/25/2015   Chronic left-sided low back pain with sciatica 07/15/2015   Essential hypertension 02/15/2015   Rotator cuff tear 06/09/2014   GERD (gastroesophageal reflux disease) 05/13/2014   Past Medical History:  Diagnosis Date   Acute meniscal tear of knee RIGHT KNEE   Anxiety    Arthritis RIGHT KNEE   Bulging lumbar disc L5 - S1   Depression    Hemochromatosis    Hyperlipidemia    Hypertension    Past Surgical History:  Procedure Laterality Date   KNEE ARTHROSCOPY  AGE 21 (APPROX)   RIGHT KNEE   KNEE  ARTHROSCOPY  07/13/2012   Procedure: ARTHROSCOPY KNEE;  Surgeon: Magnus Sinning, MD;  Location: [facility];  Service: Orthopedics;  Laterality: Right;  WITH PARTIAL MEDIAL MENISECTOMY AND REMOVAL OF LOOSE BODIES   torn rotator cuff     Allergies  Allergen Reactions   Ace Inhibitors Other (See Comments)    ANGIOEDEMA OF JOINTS- SEVERE   Prior to Admission medications   Medication Sig Start Date End Date Taking? Authorizing Provider  amLODipine (NORVASC) 10 MG tablet TAKE 1 TABLET(10 MG) BY MOUTH DAILY 05/17/21  Yes Wendie Agreste, MD  atorvastatin (LIPITOR) 20 MG tablet TAKE 1  TABLET(20 MG) BY MOUTH DAILY 05/17/21  Yes Wendie Agreste, MD  hydrochlorothiazide (MICROZIDE) 12.5 MG capsule TAKE 1 CAPSULE(12.5 MG) BY MOUTH DAILY 02/14/21  Yes Wendie Agreste, MD  tadalafil (CIALIS) 20 MG tablet Take 0.5 tablets (10 mg total) by mouth daily as needed for erectile dysfunction. 05/29/20  Yes Maximiano Coss, NP  tiZANidine (ZANAFLEX) 4 MG tablet Take 1 tablet (4 mg total) by mouth every 8 (eight) hours as needed for muscle spasms. 06/08/21  Yes Mcarthur Rossetti, MD   Social History   Socioeconomic History   Marital status: Legally Separated    Spouse name: Not on file   Number of children: 0   Years of education: BA   Highest education level: Not on file  Occupational History   Occupation: paramedic  Tobacco Use   Smoking status: Never   Smokeless tobacco: Never  Substance and Sexual Activity   Alcohol use: Yes    Comment: 4 beers a month,states heavy beer drinker in younger years   Drug use: No   Sexual activity: Yes  Other Topics Concern   Not on file  Social History Narrative   Lives at home alone   Separated, 2 step-daughters   Right-handed   Caffeine: 2 diet Cokes per day   Social Determinants of Health   Financial Resource Strain: Not on file  Food Insecurity: Not on file  Transportation Needs: Not on file  Physical Activity: Not on file  Stress: Not on file  Social Connections: Not on file  Intimate Partner Violence: Not on file    Review of Systems Per HPI.   Objective:   Vitals:   06/30/21 1351 06/30/21 1501  BP: (!) 174/100 (!) 160/100  Pulse: (!) 112 (!) 110  Temp: 98.1 F (36.7 C)   TempSrc: Temporal   SpO2: 97% 96%  Weight: 224 lb 6.4 oz (101.8 kg)     Physical Exam Vitals reviewed.  Constitutional:      General: He is not in acute distress.    Appearance: Normal appearance. He is well-developed. He is not ill-appearing, toxic-appearing or diaphoretic.  HENT:     Head: Normocephalic and atraumatic.  Neck:      Vascular: No carotid bruit or JVD.  Cardiovascular:     Rate and Rhythm: Regular rhythm. Tachycardia present.     Heart sounds: Normal heart sounds. No murmur heard. Pulmonary:     Effort: Pulmonary effort is normal.     Breath sounds: Normal breath sounds. No rales.  Musculoskeletal:     Right lower leg: No edema.     Left lower leg: No edema.     Comments: Healing scars left shoulder, no significant surrounding erythema or rash.  Skin:    General: Skin is warm and dry.  Neurological:     Mental Status: He is alert and oriented to  person, place, and time.  Psychiatric:        Mood and Affect: Mood normal.     EKG: Sinus tachycardia with rate 102.  No apparent acute findings.  No apparent significant changes from 05/29/2020. flattened T wave in lead III, now borderline inverted.  Subtle change.  No apparent other significant changes.  On initial triage - sweating, tachycardic - rushed to get here and no air conditioning in vehicle as cause of sweating.  EKG ordered given other symptoms on chief complaint.  Not diaphoretic, no distress at time of my exam  Assessment & Plan:  Blake Roberts is a 55 y.o. male . Fatigue, unspecified type - Plan: Ambulatory referral to Gastroenterology, Comprehensive metabolic panel, TSH Weight loss - Plan: Ambulatory referral to Gastroenterology, CBC, Comprehensive metabolic panel Decreased appetite - Plan: Ambulatory referral to Gastroenterology  -Possibly multifactorial.  We will check TSH, CBC, CMP to screen for anemia, electrolyte abnormality.  Importance of hydration discussed to minimize risk for volume depletion.  He is overdue for repeat colonoscopy and will refer to gastroenterology.  Also consistent use of antihypertensive may also help with symptoms.  Nonfocal exam in office, EKG without acute findings.  ER precautions given if acute changes.  Hip pain, bilateral  -Persistent symptoms, previously evaluated by orthopedics including with injections.   Recommend he follow-up with his orthopedist to decide on further treatment or imaging options.  Tachycardia - Plan: EKG 12-Lead, TSH  -Check TSH, sinus tachycardia on EKG.  Denies recent alcohol use.  Volume depletion possible.  Check labs as above.  Increase hydration.  ER precautions if symptomatic  Adenomatous polyp of colon, unspecified part of colon - Plan: Ambulatory referral to Gastroenterology  =-Overdue for follow-up colonoscopy, referral placed  Essential hypertension - Plan: Comprehensive metabolic panel, TSH  -Uncontrolled with medication nonadherence today, restart medication daily, setting alarm if needed.  1 week recheck.  No orders of the defined types were placed in this encounter.  Patient Instructions  I would recommend calling ortho - Dr. Junius Roads office to schedule a visit to review options for your hip pain.   Make sure to take your blood pressure medication daily, with use of alarm on your phone if needed.  Make sure to drink plenty of fluids.  I will check some blood work today including anemia testing, electrolyte testing and thyroid testing.  Let you know if there are concerns.  If any increasing fatigue or acute worsening of symptoms be seen right away.   I will refer you to gastroenterology for evaluation of your symptoms as well as repeat colonoscopy.  Recheck with me in 1 week.    47 minutes spent during visit, including chart review, counseling and assimilation of information, exam, discussion of plan, and chart completion.      Signed,   Merri Ray, MD [place] 06/30/21 7:50 PM

## 2021-06-30 NOTE — Patient Instructions (Addendum)
I would recommend calling ortho - Dr. Junius Roads office to schedule a visit to review options for your hip pain.   Make sure to take your blood pressure medication daily, with use of alarm on your phone if needed.  Make sure to drink plenty of fluids.  I will check some blood work today including anemia testing, electrolyte testing and thyroid testing.  Let you know if there are concerns.  If any increasing fatigue or acute worsening of symptoms be seen right away.   I will refer you to gastroenterology for evaluation of your symptoms as well as repeat colonoscopy.  Recheck with me in 1 week.

## 2021-06-30 NOTE — Patient Instructions (Signed)
Access Code: [email or web address] URL: [email or web address] Date: 06/30/2021 Prepared by: Elsie Ra  Exercises Supine Shoulder Flexion with Dowel - 2 x daily - 6 x weekly - 1-2 sets - 10 reps Supine Shoulder Abduction AAROM with Dowel - 2 x daily - 6 x weekly - 1-2 sets - 10 reps Supine Shoulder External Rotation in 45 Degrees Abduction AAROM with Dowel - 2 x daily - 6 x weekly - 1-2 sets - 10 reps Seated Isometric Shoulder Internal Rotation with Towel - 2 x daily - 6 x weekly - 1 sets - 10 reps - 5 sec hold Seated Isometric Shoulder External Rotation with Towel - 2 x daily - 6 x weekly - 1 sets - 10 reps - 5 sec hold Seated Isometric Shoulder Flexion - 2 x daily - 6 x weekly - 1 sets - 10 reps - 5 sec hold Seated Isometric Shoulder Abduction - 2 x daily - 6 x weekly - 1 sets - 10 reps - 5 sec hold

## 2021-07-01 LAB — TSH: TSH: 2.22 u[IU]/mL (ref 0.35–5.50)

## 2021-07-01 LAB — COMPREHENSIVE METABOLIC PANEL
ALT: 47 U/L (ref 0–53)
AST: 41 U/L — ABNORMAL HIGH (ref 0–37)
Albumin: 5 g/dL (ref 3.5–5.2)
Alkaline Phosphatase: 62 U/L (ref 39–117)
BUN: 19 mg/dL (ref 6–23)
CO2: 25 mEq/L (ref 19–32)
Calcium: 10.4 mg/dL (ref 8.4–10.5)
Chloride: 100 mEq/L (ref 96–112)
Creatinine, Ser: 1.14 mg/dL (ref 0.40–1.50)
GFR: 72.35 mL/min (ref >60.00)
Glucose, Bld: 114 mg/dL — ABNORMAL HIGH (ref 70–99)
Potassium: 3.8 mEq/L (ref 3.5–5.1)
Sodium: 140 mEq/L (ref 135–145)
Total Bilirubin: 0.7 mg/dL (ref 0.2–1.2)
Total Protein: 8.3 g/dL (ref 6.0–8.3)

## 2021-07-01 LAB — CBC
HCT: 44.6 % (ref 39.0–52.0)
Hemoglobin: 15.3 g/dL (ref 13.0–17.0)
MCHC: 34.4 g/dL (ref 30.0–36.0)
MCV: 96 fl (ref 78.0–100.0)
Platelets: 231 10*3/uL (ref 150.0–400.0)
RBC: 4.64 Mil/uL (ref 4.22–5.81)
RDW: 13.3 % (ref 11.5–15.5)
WBC: 10.4 10*3/uL (ref 4.0–10.5)

## 2021-07-05 ENCOUNTER — Encounter: Payer: Self-pay | Admitting: Rehabilitative and Restorative Service Providers"

## 2021-07-05 ENCOUNTER — Ambulatory Visit (INDEPENDENT_AMBULATORY_CARE_PROVIDER_SITE_OTHER): Payer: 59 | Admitting: Rehabilitative and Restorative Service Providers"

## 2021-07-05 ENCOUNTER — Other Ambulatory Visit: Payer: Self-pay

## 2021-07-05 DIAGNOSIS — M6281 Muscle weakness (generalized): Secondary | ICD-10-CM

## 2021-07-05 DIAGNOSIS — R600 Localized edema: Secondary | ICD-10-CM

## 2021-07-05 DIAGNOSIS — M25512 Pain in left shoulder: Secondary | ICD-10-CM

## 2021-07-05 NOTE — Therapy (Signed)
[facility] Physical Therapy [address] [place], [place], [address] Phone: [phone]   Fax:  [phone]  Physical Therapy Treatment  Patient Details  Name: Blake Roberts MRN: [id number] Date of Birth: 1966-11-17 Referring Provider (PT): Mcarthur Rossetti, MD   Encounter Date: 07/05/2021   PT End of Session - 07/05/21 1712     Visit Number 2    Number of Visits 15    Date for PT Re-Evaluation 08/25/21    PT Start Time 0406    PT Stop Time 0449    PT Time Calculation (min) 43 min    Activity Tolerance Patient tolerated treatment well    Behavior During Therapy [facility] for tasks assessed/performed             Past Medical History:  Diagnosis Date   Acute meniscal tear of knee RIGHT KNEE   Anxiety    Arthritis RIGHT KNEE   Bulging lumbar disc L5 - S1   Depression    Hemochromatosis    Hyperlipidemia    Hypertension     Past Surgical History:  Procedure Laterality Date   KNEE ARTHROSCOPY  AGE 26 (APPROX)   RIGHT KNEE   KNEE ARTHROSCOPY  07/13/2012   Procedure: ARTHROSCOPY KNEE;  Surgeon: Magnus Sinning, MD;  Location: [facility];  Service: Orthopedics;  Laterality: Right;  WITH PARTIAL MEDIAL MENISECTOMY AND REMOVAL OF LOOSE BODIES   torn rotator cuff      There were no vitals filed for this visit.   Subjective Assessment - 07/05/21 1658     Subjective "I am just being impatient.Marland KitchenMarland KitchenI know. I am beginning to question this surgery."    Currently in Pain? Yes    Pain Score 4     Pain Location Shoulder    Pain Orientation Left    Pain Descriptors / Indicators Dull;Nagging    Pain Type Surgical pain    Multiple Pain Sites No                               [facility] Adult PT Treatment/Exercise - 07/05/21 0001       Shoulder Exercises: Supine   Other Supine Exercises Scap protraction x 15, scap protraction with limited AROM flex/ext x 15, scap protraction with limited AROM abdct/addct x 15. Shldr  ext iso with elbow flexed x 15 with PT max verbal cues to decrease cadence and concentrate on form; scap protraction with shoulder circles counterclockwise x 15    Other Supine Exercises Review dowel shoulder flex/ext, abdct, ER; AAROM shoulder flex to pt tolerance with pt able to perform flexion without tremor but with tremor present bil UEs to lower      Manual Therapy   Manual therapy comments L shoulder PROM all planes with pt having extreme difficulty relaxing with and without oscillations; elbow flex/ext PROM with pt difficulty relaxing; elbow pronation and supination combined with elbow flex/ext PROM performed with pt with extreme difficulty relaxing. Pec Strumming and STW performed due to pt high muscle guarding even at rest into shoulder IR; Periscapular STW performed.                      PT Short Term Goals - 07/05/21 1714       PT SHORT TERM GOAL #1   Title Pt will be I and compliant with HEP    Status On-going  PT Long Term Goals - 06/30/21 1701       PT LONG TERM GOAL #1   Title Pt will improve FOTO to 71% functional score (target for all goals is 8 weeks)    Time 8    Period Weeks    Status New    Target Date 08/25/21      PT LONG TERM GOAL #2   Title Pt will improve Lt shoulder ROM to WNL.    Status New      PT LONG TERM GOAL #3   Title Pt will improve Lt shoulder strength to 5/5 MMT to improve function.    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    Status New                   Plan - 07/05/21 1712     Clinical Impression Statement Pt presents to PT Status post arthroscopy of left shoulder with extensive debriedement on 06/17/21. He will benefit from skilled PT to address his deficits in Lt shoulder ROM,strength,pain, edema, and to improve functional use of Lt upper extremity. Pt performs all exercises with  increased accessory muscle usage and increased overall tension. PT discussed with him (see pt education). Pt without increased pain after tx. All education performed during manual or exercise.    PT Treatment/Interventions Electrical Stimulation;Cryotherapy;Iontophoresis 4mg /ml Dexamethasone;Moist Heat;Ultrasound;Therapeutic activities;Therapeutic exercise;Neuromuscular re-education;Patient/family education;Manual techniques;Passive range of motion;Dry needling;Joint Manipulations;Vasopneumatic Device;Taping    PT Next Visit Plan improve AAROM and decrease pain    Consulted and Agree with Plan of Care Patient             Patient will benefit from skilled therapeutic intervention in order to improve the following deficits and impairments:  Decreased activity tolerance, Decreased range of motion, Decreased strength, Increased edema, Increased fascial restricitons, Increased muscle spasms, Impaired flexibility, Impaired UE functional use  Visit Diagnosis: Acute pain of left shoulder  Muscle weakness (generalized)  Localized edema     Problem List Patient Active Problem List   Diagnosis Date Noted   Hyperlipidemia 05/10/2017   Erectile dysfunction 05/10/2017   Memory difficulty 09/26/2016   Hemochromatosis 07/11/2016   Episodic lightheadedness 12/25/2015   Anemia, unspecified 12/25/2015   Chronic left-sided low back pain with sciatica 07/15/2015   Essential hypertension 02/15/2015   Rotator cuff tear 06/09/2014   GERD (gastroesophageal reflux disease) 05/13/2014    America Brown, PT, DPT 07/05/2021, 5:16 PM  [facility] Physical Therapy [address] [place], [place], [address] Phone: [phone]   Fax:  [phone]  Name: Blake Roberts MRN: [id number] Date of Birth: 11-Dec-1966

## 2021-07-05 NOTE — Patient Instructions (Signed)
Reviewed HEP with pt demonstrating modifications made with shoulder ISO exercises due to increased discomfort when using contralateral UE to assist (used countertop and wall). He presented with good technique except increased L shoulder IR with shoulder flex ISO with PT discussing needing to have good posture to perform to decrease increased pec tightness; discussed overall extreme pec tightness due to posturing and anxiety; discussed not recruiting other muscles to perform activities which can contribute to increased shoulder pain; discussed full can vs empty can positioning due to pt stating he has high pain when reaching forward and driving with his demonstration demonstrating increased impingement positioning of thumb in and down. PT discussed full can positioning when performing problematic activities which pt reporting shoulder pain decreasing from a 4/10 to a 2/10 with modification of thumb positioning. Pt stated difficulty with putting on seat belt reaching backward with L hand. PT discussed that being a combined motion and thereby increasing pain and to reach over with right hand at this time to retrieve seatbelt. Pt also stated driving to be problematic and demonstrating current driving habits to be in the empty can positioning. PT discussed proper hand positioning with driving. Discussed sleeping positions as pt is a sidesleeper; PT discussed options of pillows to decrease pt rolling completely over onto his left side with sleeping. PT discussed icing at home for 10-15 min as needed to assist with reduction of pain and inflammation. Pt agreed to perform.

## 2021-07-07 ENCOUNTER — Ambulatory Visit: Payer: 59 | Admitting: Family Medicine

## 2021-07-14 ENCOUNTER — Other Ambulatory Visit: Payer: Self-pay

## 2021-07-14 ENCOUNTER — Ambulatory Visit: Payer: 59 | Admitting: Physical Therapy

## 2021-07-14 DIAGNOSIS — M25512 Pain in left shoulder: Secondary | ICD-10-CM

## 2021-07-14 DIAGNOSIS — M6281 Muscle weakness (generalized): Secondary | ICD-10-CM | POA: Diagnosis not present

## 2021-07-14 DIAGNOSIS — R600 Localized edema: Secondary | ICD-10-CM

## 2021-07-14 NOTE — Therapy (Signed)
[facility] Physical Therapy [address] [place], [place], [address] Phone: [phone]   Fax:  [phone]  Physical Therapy Treatment/MD progress note  Patient Details  Name: Blake Roberts MRN: [id number] Date of Birth: 28-Feb-1966 Referring Provider (PT): Mcarthur Rossetti, MD   Encounter Date: 07/14/2021   PT End of Session - 07/14/21 1640     Visit Number 3    Number of Visits 15    Date for PT Re-Evaluation 08/25/21    Authorization Type [employer]    PT Start Time 2542    PT Stop Time 1640    PT Time Calculation (min) 43 min    Activity Tolerance Patient tolerated treatment well    Behavior During Therapy WFL for tasks assessed/performed             Past Medical History:  Diagnosis Date   Acute meniscal tear of knee RIGHT KNEE   Anxiety    Arthritis RIGHT KNEE   Bulging lumbar disc L5 - S1   Depression    Hemochromatosis    Hyperlipidemia    Hypertension     Past Surgical History:  Procedure Laterality Date   KNEE ARTHROSCOPY  AGE 18 (APPROX)   RIGHT KNEE   KNEE ARTHROSCOPY  07/13/2012   Procedure: ARTHROSCOPY KNEE;  Surgeon: Magnus Sinning, MD;  Location: [facility];  Service: Orthopedics;  Laterality: Right;  WITH PARTIAL MEDIAL MENISECTOMY AND REMOVAL OF LOOSE BODIES   torn rotator cuff      There were no vitals filed for this visit.   Subjective Assessment - 07/14/21 1601     Subjective "I am a little frustrated with the pain, it feels worse now than it did before surgery.    Pertinent History Rt RTC repair 2015    Patient Stated Goals reduce pain and improve function    Pain Onset 1 to 4 weeks ago                [facility] PT Assessment - 07/14/21 0001       Assessment   Medical Diagnosis Status post arthroscopy of left shoulder with extensive debriedement.    Referring Provider (PT) Mcarthur Rossetti, MD    Onset Date/Surgical Date 06/17/21      AROM   Left Shoulder Flexion 140 Degrees     Left Shoulder ABduction 110 Degrees    Left Shoulder Internal Rotation --   L4 reaching behind back   Left Shoulder External Rotation --   C7 reaching behind head     Strength   Overall Strength Comments 3+ to 4 gross Lt shoulder strength                           [facility] Adult PT Treatment/Exercise - 07/14/21 0001       Shoulder Exercises: Supine   Flexion Left;10 reps    Shoulder Flexion Weight (lbs) 1    Other Supine Exercises D1 and D2 flexion 1# X 10 ea on Lt      Shoulder Exercises: Sidelying   External Rotation AROM;Left    External Rotation Limitations 2 sets of 10    ABduction Left;AROM;10 reps      Shoulder Exercises: Standing   Other Standing Exercises wall ladder X 7 flexion, X 7 scaption    Other Standing Exercises UE ranger X 10 flexion and circles X 10      Shoulder Exercises: Pulleys   Flexion 2 minutes  ABduction 2 minutes      Shoulder Exercises: ROM/Strengthening   UBE (Upper Arm Bike) L2 3 min fwd, 3 min retro      Vasopneumatic   Number Minutes Vasopneumatic  10 minutes    Vasopnuematic Location  Shoulder    Vasopneumatic Pressure Medium    Vasopneumatic Temperature  34                      PT Short Term Goals - 07/05/21 1714       PT SHORT TERM GOAL #1   Title Pt will be I and compliant with HEP    Status On-going               PT Long Term Goals - 06/30/21 1701       PT LONG TERM GOAL #1   Title Pt will improve FOTO to 71% functional score (target for all goals is 8 weeks)    Time 8    Period Weeks    Status New    Target Date 08/25/21      PT LONG TERM GOAL #2   Title Pt will improve Lt shoulder ROM to WNL.    Status New      PT LONG TERM GOAL #3   Title Pt will improve Lt shoulder strength to 5/5 MMT to improve function.    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    Status New                    Plan - 07/14/21 1642     Clinical Impression Statement I feel he is overall progressing as expected post op. He feels discouraged but I informed him it takes at least 6 weeks for average heeling times post op. 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 PT recommending to continue PT.    PT Treatment/Interventions Electrical Stimulation;Cryotherapy;Iontophoresis 4mg /ml Dexamethasone;Moist Heat;Ultrasound;Therapeutic activities;Therapeutic exercise;Neuromuscular re-education;Patient/family education;Manual techniques;Passive range of motion;Dry needling;Joint Manipulations;Vasopneumatic Device;Taping    PT Next Visit Plan improve ROM and decrease pain    Consulted and Agree with Plan of Care Patient             Patient will benefit from skilled therapeutic intervention in order to improve the following deficits and impairments:  Decreased activity tolerance, Decreased range of motion, Decreased strength, Increased edema, Increased fascial restricitons, Increased muscle spasms, Impaired flexibility, Impaired UE functional use  Visit Diagnosis: Acute pain of left shoulder  Muscle weakness (generalized)  Localized edema     Problem List Patient Active Problem List   Diagnosis Date Noted   Hyperlipidemia 05/10/2017   Erectile dysfunction 05/10/2017   Memory difficulty 09/26/2016   Hemochromatosis 07/11/2016   Episodic lightheadedness 12/25/2015   Anemia, unspecified 12/25/2015   Chronic left-sided low back pain with sciatica 07/15/2015   Essential hypertension 02/15/2015   Rotator cuff tear 06/09/2014   GERD (gastroesophageal reflux disease) 05/13/2014    Silvestre Mesi 07/14/2021, 4:45 PM  [facility] Physical Therapy [address] [place], [place], [address] Phone: [phone]   Fax:  [phone]  Name: Blake Roberts MRN:  [id number] Date of Birth: 09-22-1966

## 2021-07-19 ENCOUNTER — Ambulatory Visit: Payer: 59 | Admitting: Physical Therapy

## 2021-07-19 ENCOUNTER — Other Ambulatory Visit: Payer: Self-pay

## 2021-07-19 DIAGNOSIS — R600 Localized edema: Secondary | ICD-10-CM

## 2021-07-19 DIAGNOSIS — M6281 Muscle weakness (generalized): Secondary | ICD-10-CM | POA: Diagnosis not present

## 2021-07-19 DIAGNOSIS — M25512 Pain in left shoulder: Secondary | ICD-10-CM

## 2021-07-20 NOTE — Therapy (Signed)
[facility] Physical Therapy [address] [place], [place], [address] Phone: [phone]   Fax:  [phone]  Physical Therapy Treatment  Patient Details  Name: Blake Roberts MRN: [id number] Date of Birth: 1966/11/25 Referring Provider (PT): Mcarthur Rossetti, MD   Encounter Date: 07/19/2021   PT End of Session - 07/20/21 0916     Visit Number 4    Number of Visits 15    Date for PT Re-Evaluation 08/25/21    Authorization Type UHC    PT Start Time 1600    PT Stop Time 1640    PT Time Calculation (min) 40 min    Activity Tolerance Patient tolerated treatment well    Behavior During Therapy WFL for tasks assessed/performed             Past Medical History:  Diagnosis Date   Acute meniscal tear of knee RIGHT KNEE   Anxiety    Arthritis RIGHT KNEE   Bulging lumbar disc L5 - S1   Depression    Hemochromatosis    Hyperlipidemia    Hypertension     Past Surgical History:  Procedure Laterality Date   KNEE ARTHROSCOPY  AGE 55 (APPROX)   RIGHT KNEE   KNEE ARTHROSCOPY  07/13/2012   Procedure: ARTHROSCOPY KNEE;  Surgeon: Magnus Sinning, MD;  Location: [place];  Service: Orthopedics;  Laterality: Right;  WITH PARTIAL MEDIAL MENISECTOMY AND REMOVAL OF LOOSE BODIES   torn rotator cuff      There were no vitals filed for this visit.   Subjective Assessment - 07/20/21 0905     Subjective relays his shoulder pain is good at rest only 1-2 but stil with 5-6 pain with reaching    Pertinent History Rt RTC repair 2015    Patient Stated Goals reduce pain and improve function    Pain Onset 1 to 4 weeks ago              [facility] Adult PT Treatment/Exercise - 07/20/21 0001       Shoulder Exercises: Supine   Flexion Left;10 reps    Flexion Limitations 2# ball      Shoulder Exercises: Sidelying   External Rotation AROM;Left    External Rotation Limitations 2 sets of 10    ABduction Left;AROM;10 reps      Shoulder  Exercises: Standing   Other Standing Exercises wall ladder X 5 flexion, X 5 scaption    Other Standing Exercises UE ranger X 10 flexion and circles X 10, UE ball rolls at 90 deg 2# ball X 10 up/down, lateral, circles      Shoulder Exercises: ROM/Strengthening   UBE (Upper Arm Bike) L3 3 min fwd, 3 min retro      Vasopneumatic   Number Minutes Vasopneumatic  10 minutes    Vasopnuematic Location  Shoulder    Vasopneumatic Pressure Medium    Vasopneumatic Temperature  34                      PT Short Term Goals - 07/05/21 1714       PT SHORT TERM GOAL #1   Title Pt will be I and compliant with HEP    Status On-going               PT Long Term Goals - 06/30/21 1701       PT LONG TERM GOAL #1   Title Pt will improve FOTO to 71% functional score (target for  all goals is 8 weeks)    Time 8    Period Weeks    Status New    Target Date 08/25/21      PT LONG TERM GOAL #2   Title Pt will improve Lt shoulder ROM to WNL.    Status New      PT LONG TERM GOAL #3   Title Pt will improve Lt shoulder strength to 5/5 MMT to improve function.    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    Status New                   Plan - 07/20/21 [id number]     Clinical Impression Statement We continued to work to improve his overall ROM and strength, his ROM is progressing well but strength is coming along a little more slowly and limited some by pain. He will follow up with MD this week and I sent over his last treatment note with his updated measurments.    PT Treatment/Interventions Electrical Stimulation;Cryotherapy;Iontophoresis '4mg'$ /ml Dexamethasone;Moist Heat;Ultrasound;Therapeutic activities;Therapeutic exercise;Neuromuscular re-education;Patient/family education;Manual techniques;Passive range of motion;Dry needling;Joint Manipulations;Vasopneumatic Device;Taping     PT Next Visit Plan what did MD say? improve ROM and decrease pain    Consulted and Agree with Plan of Care Patient             Patient will benefit from skilled therapeutic intervention in order to improve the following deficits and impairments:  Decreased activity tolerance, Decreased range of motion, Decreased strength, Increased edema, Increased fascial restricitons, Increased muscle spasms, Impaired flexibility, Impaired UE functional use  Visit Diagnosis: Acute pain of left shoulder  Muscle weakness (generalized)  Localized edema     Problem List Patient Active Problem List   Diagnosis Date Noted   Hyperlipidemia 05/10/2017   Erectile dysfunction 05/10/2017   Memory difficulty 09/26/2016   Hemochromatosis 07/11/2016   Episodic lightheadedness 12/25/2015   Anemia, unspecified 12/25/2015   Chronic left-sided low back pain with sciatica 07/15/2015   Essential hypertension 02/15/2015   Rotator cuff tear 06/09/2014   GERD (gastroesophageal reflux disease) 05/13/2014    Blake Roberts 07/20/2021, 9:21 AM  [facility] Physical Therapy [address] [place], [place], [address] Phone: [phone]   Fax:  [phone]  Name: Blake Roberts MRN: [id number] Date of Birth: June 23, 1966

## 2021-07-22 ENCOUNTER — Encounter: Payer: Self-pay | Admitting: Orthopaedic Surgery

## 2021-07-22 ENCOUNTER — Ambulatory Visit: Payer: 59 | Admitting: Orthopaedic Surgery

## 2021-07-22 ENCOUNTER — Other Ambulatory Visit: Payer: Self-pay

## 2021-07-22 DIAGNOSIS — Z9889 Other specified postprocedural states: Secondary | ICD-10-CM

## 2021-07-22 DIAGNOSIS — M25512 Pain in left shoulder: Secondary | ICD-10-CM

## 2021-07-22 DIAGNOSIS — G8929 Other chronic pain: Secondary | ICD-10-CM

## 2021-07-22 NOTE — Progress Notes (Signed)
The patient is now about 6 weeks status post a left shoulder arthroscopy with extensive debridement and subacromial decompression.  We found extensive synovitis and where in his left shoulder.  This is likely from years of strenuous work.  He has been diligent going to physical therapy with his shoulder.  His range of motion is improving but his strength still has some deficits and he is having significant pain to be expected given the way that therapy is working and he is pushing himself and due to the fact that we found such significant inflamed tissue in that left shoulder.  On exam today he has improved range of motion of the left shoulder.  It is still quite painful.  I did recommend a subacromial steroid injection today to help calm down the inflammatory pain from surgery.  He agreed to this treatment plan.  I do feel that he should continue aggressive physical therapy for his left shoulder.  He was scheduled to return to work on August 11.  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  I would prefer that he stay out until September 6 when we would allow him to likely return to full work duties without any restrictions at all.  I will see him back in 4 weeks.

## 2021-07-23 ENCOUNTER — Telehealth: Payer: Self-pay | Admitting: Orthopaedic Surgery

## 2021-07-23 NOTE — Telephone Encounter (Signed)
Received medical records release form,disability paperwork   Forwarding to [place] today

## 2021-07-26 ENCOUNTER — Other Ambulatory Visit: Payer: Self-pay

## 2021-07-26 ENCOUNTER — Encounter: Payer: 59 | Admitting: Physical Therapy

## 2021-07-26 ENCOUNTER — Ambulatory Visit: Payer: 59 | Admitting: Physical Therapy

## 2021-07-26 DIAGNOSIS — M6281 Muscle weakness (generalized): Secondary | ICD-10-CM

## 2021-07-26 DIAGNOSIS — M25512 Pain in left shoulder: Secondary | ICD-10-CM | POA: Diagnosis not present

## 2021-07-26 DIAGNOSIS — R600 Localized edema: Secondary | ICD-10-CM | POA: Diagnosis not present

## 2021-07-26 NOTE — Patient Instructions (Signed)
Access Code: [email or web address] URL: [email or web address] Date: 07/26/2021 Prepared by: Elsie Ra  Exercises Standing Single Shoulder Flexion Wall Slide with Palm Up - 2 x daily - 6 x weekly - 1 sets - 10 reps Standing Shoulder Abduction Slides at Wall - 2 x daily - 6 x weekly - 1 sets - 10 reps Standing Shoulder External Rotation Stretch in Doorway - 2 x daily - 6 x weekly - 1 sets - 5 reps - 10 hold Standing Shoulder Internal Rotation Stretch with Towel - 2 x daily - 6 x weekly - 1 sets - 5 reps - 10 sec hold Prone Single Arm Shoulder Y - 2 x daily - 6 x weekly - 2 sets - 10 reps Prone Shoulder Horizontal Abduction - 2 x daily - 3-4 x weekly - 2 sets - 10 reps - 3 sec hold Prone Shoulder Extension - Single Arm - 2 x daily - 3-4 x weekly - 2 sets - 10 reps - 3 sec hold Sidelying Shoulder External Rotation - 2 x daily - 6 x weekly - 2-3 sets - 10 reps Sidelying Shoulder Abduction Palm Forward - 2 x daily - 6 x weekly - 2-3 sets - 10 reps Standing Single Arm Row with Resistance Thumb Up - 2 x daily - 6 x weekly - 3 sets - 10 reps Shoulder External Rotation with Anchored Resistance - 2 x daily - 6 x weekly - 10 reps - 2-3 sets Shoulder Internal Rotation with Resistance - 2 x daily - 6 x weekly - 10 reps - 2-3 sets

## 2021-07-26 NOTE — Therapy (Signed)
[facility] Physical Therapy [address] [place], [place], [address] Phone: [phone]   Fax:  [phone]  Physical Therapy Treatment  Patient Details  Name: Blake Roberts MRN: [id number] Date of Birth: 02-04-1966 Referring Provider (PT): Blake Rossetti, MD   Encounter Date: 07/26/2021   PT End of Session - 07/26/21 1548     Visit Number 5    Number of Visits 15    Date for PT Re-Evaluation 08/25/21    Authorization Type UHC    PT Start Time 1510    PT Stop Time 1554    PT Time Calculation (min) 44 min    Activity Tolerance Patient tolerated treatment well    Behavior During Therapy WFL for tasks assessed/performed             Past Medical History:  Diagnosis Date   Acute meniscal tear of knee RIGHT KNEE   Anxiety    Arthritis RIGHT KNEE   Bulging lumbar disc L5 - S1   Depression    Hemochromatosis    Hyperlipidemia    Hypertension     Past Surgical History:  Procedure Laterality Date   KNEE ARTHROSCOPY  AGE 55 (APPROX)   RIGHT KNEE   KNEE ARTHROSCOPY  07/13/2012   Procedure: ARTHROSCOPY KNEE;  Surgeon: Blake Sinning, MD;  Location: [place];  Service: Orthopedics;  Laterality: Right;  WITH PARTIAL MEDIAL MENISECTOMY AND REMOVAL OF LOOSE BODIES   torn rotator cuff      There were no vitals filed for this visit.   Subjective Assessment - 07/26/21 1523     Subjective relays his shoulder pain is much better after getting injection in it last week. He does mention he stepped down and felt his Rt knee give and he fears he messed up his meniscus. He will follow up with Dr. Marlou Roberts about this 08/05/55.    Pertinent History Rt RTC repair 2015    Patient Stated Goals reduce pain and improve function    Pain Onset 1 to 4 weeks ago               [name] Adult PT Treatment/Exercise - 07/26/21 0001       Shoulder Exercises: Supine   Flexion Left;10 reps    Theraband Level (Shoulder Flexion) Level 3 (Green)     Shoulder Flexion Weight (lbs) 2 sets      Shoulder Exercises: Prone   Other Prone Exercises Y,T,I 2X10 ea on Lt      Shoulder Exercises: Sidelying   External Rotation Strengthening;Left    External Rotation Weight (lbs) 2    External Rotation Limitations 2 sets of 10    ABduction Strengthening;Left    ABduction Weight (lbs) 2    ABduction Limitations 2X10      Shoulder Exercises: Standing   External Rotation Left    Theraband Level (Shoulder External Rotation) Level 3 (Green)    External Rotation Limitations 3X10    Internal Rotation Left    Theraband Level (Shoulder Internal Rotation) Level 3 (Green)    Internal Rotation Weight (lbs) 3X10    Row Left;Theraband    Theraband Level (Shoulder Row) Level 3 (Green)    Row Limitations 3X10    Other Standing Exercises wall ladder X 7 flexion, X 5 scaption      Shoulder Exercises: Pulleys   Flexion 2 minutes    ABduction 2 minutes      Shoulder Exercises: ROM/Strengthening   UBE (Upper Arm Bike) L3 2.5  min fwd, 2.5 min retro      Shoulder Exercises: Stretch   Internal Rotation Stretch 5 reps    Internal Rotation Stretch Limitations 10 sec hold with strap behind back    External Rotation Stretch 5 reps;10 seconds   doorway     Vasopneumatic   Number Minutes Vasopneumatic  10 minutes    Vasopnuematic Location  Shoulder    Vasopneumatic Pressure Medium    Vasopneumatic Temperature  34                      PT Short Term Goals - 07/05/21 1714       PT SHORT TERM GOAL #1   Title Pt will be I and compliant with HEP    Status On-going               PT Long Term Goals - 06/30/21 1701       PT LONG TERM GOAL #1   Title Pt will improve FOTO to 71% functional score (target for all goals is 8 weeks)    Time 8    Period Weeks    Status New    Target Date 08/25/21      PT LONG TERM GOAL #2   Title Pt will improve Lt shoulder ROM to WNL.    Status New      PT LONG TERM GOAL #3   Title Pt will improve Lt  shoulder strength to 5/5 MMT to improve function.    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    Status New                   Plan - 07/26/21 1550     Clinical Impression Statement He had drastic improvment today in pain and function of his left shoulder after getting injection. I progressed his HEP to transition into more strengthening as tolerated. He showed good understanding and form with his new HEP.    PT Treatment/Interventions Electrical Stimulation;Cryotherapy;Iontophoresis '4mg'$ /ml Dexamethasone;Moist Heat;Ultrasound;Therapeutic activities;Therapeutic exercise;Neuromuscular re-education;Patient/family education;Manual techniques;Passive range of motion;Dry needling;Joint Manipulations;Vasopneumatic Device;Taping    PT Next Visit Plan how was new HEP?    PT Home Exercise Plan Access Code: [email or web address]    Consulted and Agree with Plan of Care Patient             Patient will benefit from skilled therapeutic intervention in order to improve the following deficits and impairments:  Decreased activity tolerance, Decreased range of motion, Decreased strength, Increased edema, Increased fascial restricitons, Increased muscle spasms, Impaired flexibility, Impaired UE functional use  Visit Diagnosis: Acute pain of left shoulder  Muscle weakness (generalized)  Localized edema     Problem List Patient Active Problem List   Diagnosis Date Noted   Hyperlipidemia 05/10/2017   Erectile dysfunction 05/10/2017   Memory difficulty 09/26/2016   Hemochromatosis 07/11/2016   Episodic lightheadedness 12/25/2015   Anemia, unspecified 12/25/2015   Chronic left-sided low back pain with sciatica 07/15/2015   Essential hypertension 02/15/2015   Rotator cuff tear 06/09/2014   GERD (gastroesophageal reflux disease) 05/13/2014    Blake Roberts 07/26/2021, 3:53 PM  [facility] Physical Therapy [address] [place], [place], [address] Phone: [phone]   Fax:  [phone]  Name: Blake Roberts MRN: [id number] Date of Birth: 09/16/1966

## 2021-08-02 ENCOUNTER — Encounter: Payer: 59 | Admitting: Physical Therapy

## 2021-08-04 ENCOUNTER — Ambulatory Visit (INDEPENDENT_AMBULATORY_CARE_PROVIDER_SITE_OTHER): Payer: 59 | Admitting: Orthopedic Surgery

## 2021-08-04 ENCOUNTER — Telehealth: Payer: Self-pay | Admitting: Orthopaedic Surgery

## 2021-08-04 ENCOUNTER — Encounter: Payer: Self-pay | Admitting: Orthopedic Surgery

## 2021-08-04 ENCOUNTER — Other Ambulatory Visit: Payer: Self-pay

## 2021-08-04 ENCOUNTER — Ambulatory Visit: Payer: Self-pay

## 2021-08-04 DIAGNOSIS — M1611 Unilateral primary osteoarthritis, right hip: Secondary | ICD-10-CM

## 2021-08-04 DIAGNOSIS — M1711 Unilateral primary osteoarthritis, right knee: Secondary | ICD-10-CM | POA: Diagnosis not present

## 2021-08-04 DIAGNOSIS — M25551 Pain in right hip: Secondary | ICD-10-CM

## 2021-08-04 NOTE — Telephone Encounter (Signed)
Received $25.00 check from patient    Forwarding to [facility] troday
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
## 2021-08-08 MED ORDER — METHYLPREDNISOLONE ACETATE 40 MG/ML IJ SUSP
40.0000 mg | INTRAMUSCULAR | Status: AC | PRN
  Administered 2021-08-04: 40 mg via INTRA_ARTICULAR

## 2021-08-08 MED ORDER — LIDOCAINE HCL 1 % IJ SOLN
5.0000 mL | INTRAMUSCULAR | Status: AC | PRN
  Administered 2021-08-04: 5 mL

## 2021-08-08 MED ORDER — BUPIVACAINE HCL 0.25 % IJ SOLN
4.0000 mL | INTRAMUSCULAR | Status: AC | PRN
  Administered 2021-08-04: 4 mL via INTRA_ARTICULAR

## 2021-08-09 ENCOUNTER — Encounter: Payer: 59 | Admitting: Physical Therapy

## 2021-08-09 ENCOUNTER — Telehealth: Payer: Self-pay | Admitting: Physical Therapy

## 2021-08-09 NOTE — Telephone Encounter (Signed)
He did not show up for his PT appointment at 1:45 pm. I called him and he thought his appointment was at 4 pm. He has another PT visit next week at 4pm that he got mixed up with and he plans to attend next week.  Elsie Ra, PT, DPT 08/09/21 2:14 PM

## 2021-08-16 ENCOUNTER — Encounter: Payer: Self-pay | Admitting: Physical Therapy

## 2021-08-16 ENCOUNTER — Other Ambulatory Visit: Payer: Self-pay

## 2021-08-16 ENCOUNTER — Ambulatory Visit: Payer: 59 | Admitting: Physical Therapy

## 2021-08-16 DIAGNOSIS — R600 Localized edema: Secondary | ICD-10-CM | POA: Diagnosis not present

## 2021-08-16 DIAGNOSIS — M6281 Muscle weakness (generalized): Secondary | ICD-10-CM

## 2021-08-16 DIAGNOSIS — M25512 Pain in left shoulder: Secondary | ICD-10-CM | POA: Diagnosis not present

## 2021-08-16 NOTE — Therapy (Addendum)
[facility] [address] [place], [place], [address] Phone: [phone]   Fax:  [phone]  Physical Therapy Treatment/Discharge addendum  Patient Details  Name: Blake Roberts MRN: [id number] Date of Birth: 04/08/1966 Referring Provider (PT): Mcarthur Rossetti, MD   Encounter Date: 08/16/2021   PT End of Session - 08/16/21 1644     Visit Number 6    Number of Visits 15    Date for PT Re-Evaluation 08/25/21    Authorization Type UHC    PT Start Time 7078    PT Stop Time 1645    PT Time Calculation (min) 50 min    Activity Tolerance Patient tolerated treatment well    Behavior During Therapy WFL for tasks assessed/performed             Past Medical History:  Diagnosis Date   Acute meniscal tear of knee RIGHT KNEE   Anxiety    Arthritis RIGHT KNEE   Bulging lumbar disc L5 - S1   Depression    Hemochromatosis    Hyperlipidemia    Hypertension     Past Surgical History:  Procedure Laterality Date   KNEE ARTHROSCOPY  AGE 55 (APPROX)   RIGHT KNEE   KNEE ARTHROSCOPY  07/13/2012   Procedure: ARTHROSCOPY KNEE;  Surgeon: Magnus Sinning, MD;  Location: [place];  Service: Orthopedics;  Laterality: Right;  WITH PARTIAL MEDIAL MENISECTOMY AND REMOVAL OF LOOSE BODIES   torn rotator cuff      There were no vitals filed for this visit.   Subjective Assessment - 08/16/21 1606     Subjective relays his Rt knee is doing much better after injection. His shoulder feels good only about 1/10 pain overall.    Pertinent History Rt RTC repair 2015    Patient Stated Goals reduce pain and improve function    Pain Onset 1 to 4 weeks ago                [facility] PT Assessment - 08/16/21 0001       Assessment   Medical Diagnosis Status post arthroscopy of left shoulder with extensive debriedement.    Referring Provider (PT) Mcarthur Rossetti, MD    Onset Date/Surgical Date 06/17/21      Observation/Other  Assessments   Focus on Therapeutic Outcomes (FOTO)  83% today, goal was 715      AROM   Overall AROM Comments now [facility]      Strength   Overall Strength Comments 5/5 MMT grossly Lt shoulder                           [facility] Adult PT Treatment/Exercise - 08/16/21 0001       Shoulder Exercises: Standing   Flexion Both;10 reps    Shoulder Flexion Weight (lbs) 10    Flexion Limitations 3 sets    Other Standing Exercises ER and IR at 90/90 green 3X10    Other Standing Exercises bilat H abd green 3X10      Shoulder Exercises: ROM/Strengthening   Lat Pull Limitations 45# 3X15    Cybex Press Limitations 55# 3X15    Cybex Row Limitations 75# 3X15    Other ROM/Strengthening Exercises cable lateral shoulder raise 5# 3X15 bilat, Cable curl to OH press 10# 3X15 bilat, tri extension with rope 55# 3X15, rope facepulls 15# 3X15  PT Short Term Goals - 08/16/21 1649       PT SHORT TERM GOAL #1   Title Pt will be I and compliant with HEP    Status Achieved               PT Long Term Goals - 08/16/21 1649       PT LONG TERM GOAL #1   Title Pt will improve FOTO to 71% functional score (target for all goals is 8 weeks)    Baseline 83% on 8/22    Time 8    Period Weeks    Status Achieved      PT LONG TERM GOAL #2   Title Pt will improve Lt shoulder ROM to WNL.    Status Achieved      PT LONG TERM GOAL #3   Title Pt will improve Lt shoulder strength to 5/5 MMT to improve function.    Status Achieved      PT LONG TERM GOAL #4   Title Pt will reduce overall pain to less than 2-3/10 with usual activity and be able to return to EMS work.    Baseline will try to return to work next week    Status On-going                   Plan - 08/16/21 1645     Clinical Impression Statement He has done very well with his shoulder post op, he is at a good overall functional level and is ready for discharge. I feel he is ready to begin  strengthening work in rep ranges 10-20 at home with his dumb bells and resistance equipment. We had him perform these at our clinic without any pain or much difficulty. 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  We will keep his PT case open for 30 days in the event he needs to return if he has setback or aggravates his shoulder returning to work. He agrees to this plan without any questions or concerns.    PT Treatment/Interventions Electrical Stimulation;Cryotherapy;Iontophoresis 66m/ml Dexamethasone;Moist Heat;Ultrasound;Therapeutic activities;Therapeutic exercise;Neuromuscular re-education;Patient/family education;Manual techniques;Passive range of motion;Dry needling;Joint Manipulations;Vasopneumatic Device;Taping    PT Next Visit Plan DC after 30 days if he does not return.    PT Home Exercise Plan Access Code: [email or web address]   Consulted and Agree with Plan of Care Patient             Patient will benefit from skilled therapeutic intervention in order to improve the following deficits and impairments:  Decreased activity tolerance, Decreased range of motion, Decreased strength, Increased edema, Increased fascial restricitons, Increased muscle spasms, Impaired flexibility, Impaired UE functional use  Visit Diagnosis: Acute pain of left shoulder  Muscle weakness (generalized)  Localized edema     Problem List Patient Active Problem List   Diagnosis Date Noted   Hyperlipidemia 05/10/2017   Erectile dysfunction 05/10/2017   Memory difficulty 09/26/2016   Hemochromatosis 07/11/2016   Episodic lightheadedness 12/25/2015   Anemia, unspecified 12/25/2015   Chronic left-sided low back pain with sciatica 07/15/2015   Essential hypertension 02/15/2015   Rotator cuff tear 06/09/2014   GERD (gastroesophageal reflux disease) 05/13/2014     BSilvestre Mesi8/22/2022, 4:50 PM  [facility] [address] [place] [address]Phone: [phone]  Fax:  [phone] Name: Blake MEYNMRN: [id number] of Birth:  01/20/1966  PHYSICAL THERAPY DISCHARGE SUMMARY  Visits from Start of Care: 6  Current functional level related to goals / functional outcomes: See above   Remaining deficits: See above   Education / Equipment: See above Plan: Patient agrees to discharge.  Patient goals were met. Patient is being discharged due to meeting the stated rehab goals and did not need to return in the 30 days we held his episode.

## 2021-08-19 ENCOUNTER — Ambulatory Visit (INDEPENDENT_AMBULATORY_CARE_PROVIDER_SITE_OTHER): Payer: 59 | Admitting: Orthopaedic Surgery

## 2021-08-19 ENCOUNTER — Encounter: Payer: Self-pay | Admitting: Orthopaedic Surgery

## 2021-08-19 DIAGNOSIS — Z9889 Other specified postprocedural states: Secondary | ICD-10-CM

## 2021-08-19 NOTE — Progress Notes (Signed)
Blake Roberts comes in today in follow-up for his left shoulder after having arthroscopic surgery with extensive debridement subacromial decompression in June.  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  He feels like he is ready to get back to work starting September 6 and that is only etc. he will go back without restrictions.  He had recently injured his right knee and saw my partner Dr. Marlou Sa.  He has known osteoarthritis of the right knee and new hip films also showed osteoarthritis of the right hip.  He had a steroid injection by Dr. Marlou Sa in his right knee and he said that is really helped him quite a bit.  He was supposed to have a steroid injection in his right hip under fluoroscopy but that was potentially not set up.  He states that his hip is doing better now and he would declined that injection if they called.  He is walking without assistive device and he feels ready go back to work.  At his last visit with [place] I did provide a steroid injection of the subacromial outlet he said that is really helped him quite a bit and now his soreness is minimal.  He reports good range of motion that is full with the left shoulder and good strength.  His left shoulder does move smoothly and fluidly and has excellent strength.  He looks good overall.  I did give a note to allow him to return to full work duties without restrictions starting September 6.  He understands that if his right hip or right knee worsen and give him problems to not hesitate to come back and see [place].  All questions and concerns were answered and addressed.  Follow-up is as needed.

## 2021-09-18 ENCOUNTER — Other Ambulatory Visit: Payer: Self-pay | Admitting: Family Medicine

## 2021-09-18 DIAGNOSIS — I10 Essential (primary) hypertension: Secondary | ICD-10-CM

## 2021-11-11 ENCOUNTER — Other Ambulatory Visit: Payer: Self-pay | Admitting: Family Medicine

## 2021-11-11 DIAGNOSIS — I10 Essential (primary) hypertension: Secondary | ICD-10-CM

## 2021-11-11 DIAGNOSIS — E782 Mixed hyperlipidemia: Secondary | ICD-10-CM

## 2022-01-05 ENCOUNTER — Encounter: Payer: Self-pay | Admitting: Hematology

## 2022-01-09 ENCOUNTER — Encounter: Payer: Self-pay | Admitting: Hematology

## 2022-01-10 ENCOUNTER — Ambulatory Visit: Payer: BC Managed Care – PPO | Admitting: Family Medicine

## 2022-01-10 VITALS — BP 140/82 | HR 88 | Temp 98.1°F | Resp 17 | Ht 71.0 in | Wt 235.2 lb

## 2022-01-10 DIAGNOSIS — M25551 Pain in right hip: Secondary | ICD-10-CM

## 2022-01-10 DIAGNOSIS — M25552 Pain in left hip: Secondary | ICD-10-CM

## 2022-01-10 DIAGNOSIS — I10 Essential (primary) hypertension: Secondary | ICD-10-CM

## 2022-01-10 DIAGNOSIS — Z23 Encounter for immunization: Secondary | ICD-10-CM

## 2022-01-10 DIAGNOSIS — E782 Mixed hyperlipidemia: Secondary | ICD-10-CM

## 2022-01-10 DIAGNOSIS — N529 Male erectile dysfunction, unspecified: Secondary | ICD-10-CM

## 2022-01-10 MED ORDER — TADALAFIL 20 MG PO TABS: 10.0000 mg | ORAL_TABLET | Freq: Every day | ORAL | 3 refills | Status: DC | PRN

## 2022-01-10 MED ORDER — HYDROCHLOROTHIAZIDE 25 MG PO TABS: 25.0000 mg | ORAL_TABLET | Freq: Every day | ORAL | 1 refills | Status: DC

## 2022-01-10 MED ORDER — AMLODIPINE BESYLATE 10 MG PO TABS: ORAL_TABLET | ORAL | 1 refills | Status: DC

## 2022-01-10 MED ORDER — ATORVASTATIN CALCIUM 20 MG PO TABS: ORAL_TABLET | ORAL | 1 refills | Status: DC

## 2022-01-10 NOTE — Progress Notes (Signed)
Subjective:  Patient ID: Blake Roberts, male    DOB: [birth year]  Age: 56 y.o. MRN: [id number]  CC:  Chief Complaint  Patient presents with   Hypertension    Pt is concerned his BP is not lowering despite better habits   Arthritis    Pt having pain hip and back pain reports trouble with prolonged standing or if he should see a rheumatologist     HPI [name] presents for   Hypertension: Amlodipine 10 mg daily, hydrochlorothiazide 12.5 mg daily.  Medication adherence discussed at his July 2022 visit. Consistently taking both meds per day, no new side effects. Staying hydrated, no consistent cramps or swelling.  Home readings:140-150/90-100.  Alcohol on weekends. 3-4 beers on Sunday.  2 caffeinated drinks in am.  Exercise limited d/t hip pain.  Fast food few days per week with work. No regular soda/sweet tea.  BP Readings from Last 3 Encounters:  01/10/22 140/82  06/30/21 (!) 160/100  09/03/20 140/82   Lab Results  Component Value Date   CREATININE 1.14 06/30/2021   Wt Readings from Last 3 Encounters:  01/10/22 235 lb 3.2 oz (106.7 kg)  06/30/21 224 lb 6.4 oz (101.8 kg)  09/03/20 221 lb (100.2 kg)   Hyperlipidemia: Lipitor 20mg  qd. No new side effects.  Lab Results  Component Value Date   CHOL 175 05/29/2020   HDL 64 05/29/2020   LDLCALC 86 05/29/2020   TRIG 145 05/29/2020   CHOLHDL 2.7 05/29/2020   Lab Results  Component Value Date   ALT 47 06/30/2021   AST 41 (H) 06/30/2021   ALKPHOS 62 06/30/2021   BILITOT 0.7 06/30/2021   Erectile dysfunction: No cp with exertion, no change in vision or hearing. Effective with half pill. No side effects.     Right hip pain, back pain Bilateral hip pain discussed at his June 30, 2021 visit.  Had been evaluated previously with orthopedics, and continued orthopedic eval planned. Orthopedic eval August 2022 with x-rays indicating moderately severe right hip arthritis with cam deformity and moderate left hip  arthritis.  This seems to be consistent with his complaints of groin pain after standing for 5 minutes.  Plan for right hip intra-articular injection with Dr. Ernestina Patches then Dr. Marlou Sa or Ninfa Linden for assessment of relief from injections and decision point for against further intervention. Appointment with Dr. Ninfa Linden on August 25, reported improved pain in hip at that time and injection was declined.  Bilateral hip pain - about equal. Sore to stand for few mins, and when stepping into shower or vehicle. No acute changes, but progressively more sore. Able to exercise. Standing/walking seems to be issue.  Some relief with ibuprofen - 600mg  every other day to daily.   History Patient Active Problem List   Diagnosis Date Noted   Hyperlipidemia 05/10/2017   Erectile dysfunction 05/10/2017   Memory difficulty 09/26/2016   Hemochromatosis 07/11/2016   Episodic lightheadedness 12/25/2015   Anemia, unspecified 12/25/2015   Chronic left-sided low back pain with sciatica 07/15/2015   Essential hypertension 02/15/2015   Rotator cuff tear 06/09/2014   GERD (gastroesophageal reflux disease) 05/13/2014   Past Medical History:  Diagnosis Date   Acute meniscal tear of knee RIGHT KNEE   Anxiety    Arthritis RIGHT KNEE   Bulging lumbar disc L5 - S1   Depression    Hemochromatosis    Hyperlipidemia    Hypertension    Past Surgical History:  Procedure Laterality Date   KNEE ARTHROSCOPY  AGE 45 (APPROX)   RIGHT KNEE   KNEE ARTHROSCOPY  07/13/2012   Procedure: ARTHROSCOPY KNEE;  Surgeon: Magnus Sinning, MD;  Location: [facility];  Service: Orthopedics;  Laterality: Right;  WITH PARTIAL MEDIAL MENISECTOMY AND REMOVAL OF LOOSE BODIES   torn rotator cuff     Allergies  Allergen Reactions   Ace Inhibitors Other (See Comments)    ANGIOEDEMA OF JOINTS- SEVERE   Prior to Admission medications   Medication Sig Start Date End Date Taking? Authorizing Provider  amLODipine (NORVASC) 10 MG  tablet TAKE 1 TABLET(10 MG) BY MOUTH DAILY 11/11/21  Yes Wendie Agreste, MD  atorvastatin (LIPITOR) 20 MG tablet TAKE 1 TABLET(20 MG) BY MOUTH DAILY 11/11/21  Yes Wendie Agreste, MD  hydrochlorothiazide (MICROZIDE) 12.5 MG capsule TAKE 1 CAPSULE(12.5 MG) BY MOUTH DAILY 11/11/21  Yes Wendie Agreste, MD  tadalafil (CIALIS) 20 MG tablet Take 0.5 tablets (10 mg total) by mouth daily as needed for erectile dysfunction. 05/29/20  Yes Maximiano Coss, NP  tiZANidine (ZANAFLEX) 4 MG tablet Take 1 tablet (4 mg total) by mouth every 8 (eight) hours as needed for muscle spasms. Patient not taking: Reported on 01/10/2022 06/08/21   Mcarthur Rossetti, MD   Social History   Socioeconomic History   Marital status: Legally Separated    Spouse name: Not on file   Number of children: 0   Years of education: BA   Highest education level: Not on file  Occupational History   Occupation: paramedic  Tobacco Use   Smoking status: Never   Smokeless tobacco: Never  Substance and Sexual Activity   Alcohol use: Yes    Comment: 4 beers a month,states heavy beer drinker in younger years   Drug use: No   Sexual activity: Yes  Other Topics Concern   Not on file  Social History Narrative   Lives at home alone   Separated, 2 step-daughters   Right-handed   Caffeine: 2 diet Cokes per day   Social Determinants of Health   Financial Resource Strain: Not on file  Food Insecurity: Not on file  Transportation Needs: Not on file  Physical Activity: Not on file  Stress: Not on file  Social Connections: Not on file  Intimate Partner Violence: Not on file    Review of Systems  Constitutional:  Negative for fatigue and unexpected weight change.  Eyes:  Negative for visual disturbance.  Respiratory:  Negative for cough, chest tightness and shortness of breath.   Cardiovascular:  Negative for chest pain, palpitations and leg swelling.  Gastrointestinal:  Negative for abdominal pain and blood in stool.   Musculoskeletal:  Positive for arthralgias.  Neurological:  Negative for dizziness, light-headedness and headaches.    Objective:   Vitals:   01/10/22 1532  BP: 140/82  Pulse: 88  Resp: 17  Temp: 98.1 F (36.7 C)  TempSrc: Temporal  SpO2: 98%  Weight: 235 lb 3.2 oz (106.7 kg)  Height: 5\' 11"  (1.803 m)     Physical Exam Vitals reviewed.  Constitutional:      Appearance: He is well-developed.  HENT:     Head: Normocephalic and atraumatic.  Neck:     Vascular: No carotid bruit or JVD.  Cardiovascular:     Rate and Rhythm: Normal rate and regular rhythm.     Heart sounds: Normal heart sounds. No murmur heard. Pulmonary:     Effort: Pulmonary effort is normal.     Breath sounds: Normal breath sounds.  No rales.  Musculoskeletal:     Right lower leg: No edema.     Left lower leg: No edema.  Skin:    General: Skin is warm and dry.  Neurological:     Mental Status: He is alert and oriented to person, place, and time.  Psychiatric:        Mood and Affect: Mood normal.       Assessment & Plan:  ZYMEIR SALMINEN is a 56 y.o. male . Uncontrolled hypertension - Plan: hydrochlorothiazide (HYDRODIURIL) 25 MG tablet, Comprehensive metabolic panel, amLODipine (NORVASC) 10 MG tablet, Basic metabolic panel  - decreased control. Increase HCTZ to 25mg  qd. Sme dose amlodipine. Check labs, repeat lab visit in 1 month. Home BP monitoring with rtc precautions.   Need for influenza vaccination - Plan: Flu Vaccine QUAD 6+ mos PF IM (Fluarix Quad PF)  Mixed hyperlipidemia - Plan: atorvastatin (LIPITOR) 20 MG tablet, Lipid panel  -  Stable, tolerating current regimen. Medications refilled. Labs pending as above.   Erectile dysfunction, unspecified erectile dysfunction type - Plan: tadalafil (CIALIS) 20 MG tablet Cialis Rx given - use lowest effective dose. Side effects discussed (including but not limited to headache/flushing, blue discoloration of vision, possible vascular steal and  risk of cardiac effects if underlying unknown coronary artery disease, and permanent sensorineural hearing loss). Understanding expressed.  Hip pain, bilateral  - DJD noted on prior imaging, workup with ortho. Has not received injection - may provide relief - recommend follow up with ortho for possible injection and to decide next steps.   Meds ordered this encounter  Medications   hydrochlorothiazide (HYDRODIURIL) 25 MG tablet    Sig: Take 1 tablet (25 mg total) by mouth daily.    Dispense:  90 tablet    Refill:  1   amLODipine (NORVASC) 10 MG tablet    Sig: TAKE 1 TABLET(10 MG) BY MOUTH DAILY    Dispense:  90 tablet    Refill:  1   atorvastatin (LIPITOR) 20 MG tablet    Sig: TAKE 1 TABLET(20 MG) BY MOUTH DAILY    Dispense:  90 tablet    Refill:  1   tadalafil (CIALIS) 20 MG tablet    Sig: Take 0.5 tablets (10 mg total) by mouth daily as needed for erectile dysfunction.    Dispense:  6 tablet    Refill:  3   Patient Instructions  Increase the HCTZ to 25mg  per day for now, stay on amlodipine same dose for now.  High potassium food daily may be helpful with new dose of diuretic.  Give me an update on your home readings in the next few weeks.  Lab only visit in 1 month for electrolytes.  Follow-up with orthopedics regarding your hip pain as it appears arthritis may be main contributor and possible injection may be helpful initially.  Let me know if you need a new referral or second opinion.  Thanks for coming in today and take care.       Signed,   Merri Ray, MD [facility] 01/10/22 5:21 PM

## 2022-01-10 NOTE — Patient Instructions (Addendum)
Increase the HCTZ to 25mg  per day for now, stay on amlodipine same dose for now.  High potassium food daily may be helpful with new dose of diuretic.  Give me an update on your home readings in the next few weeks.  Lab only visit in 1 month for electrolytes.  Follow-up with orthopedics regarding your hip pain as it appears arthritis may be main contributor and possible injection may be helpful initially.  Let me know if you need a new referral or second opinion.  Thanks for coming in today and take care.

## 2022-01-11 LAB — COMPREHENSIVE METABOLIC PANEL
ALT: 22 U/L (ref 0–53)
AST: 20 U/L (ref 0–37)
Albumin: 4.6 g/dL (ref 3.5–5.2)
Alkaline Phosphatase: 51 U/L (ref 39–117)
BUN: 18 mg/dL (ref 6–23)
CO2: 23 mEq/L (ref 19–32)
Calcium: 9.6 mg/dL (ref 8.4–10.5)
Chloride: 102 mEq/L (ref 96–112)
Creatinine, Ser: 1.08 mg/dL (ref 0.40–1.50)
GFR: 76.91 mL/min (ref >60.00)
Glucose, Bld: 110 mg/dL — ABNORMAL HIGH (ref 70–99)
Potassium: 4 mEq/L (ref 3.5–5.1)
Sodium: 138 mEq/L (ref 135–145)
Total Bilirubin: 0.5 mg/dL (ref 0.2–1.2)
Total Protein: 7.8 g/dL (ref 6.0–8.3)

## 2022-01-11 LAB — LIPID PANEL
Cholesterol: 197 mg/dL (ref 0–200)
HDL: 59.8 mg/dL (ref >39.00)
NonHDL: 137.66
Total CHOL/HDL Ratio: 3
Triglycerides: 338 mg/dL — ABNORMAL HIGH (ref 0.0–149.0)
VLDL: 67.6 mg/dL — ABNORMAL HIGH (ref 0.0–40.0)

## 2022-01-11 LAB — LDL CHOLESTEROL, DIRECT: Direct LDL: 108 mg/dL

## 2022-03-22 ENCOUNTER — Ambulatory Visit (INDEPENDENT_AMBULATORY_CARE_PROVIDER_SITE_OTHER): Payer: BC Managed Care – PPO | Admitting: Orthopaedic Surgery

## 2022-03-22 ENCOUNTER — Ambulatory Visit (INDEPENDENT_AMBULATORY_CARE_PROVIDER_SITE_OTHER): Payer: BC Managed Care – PPO

## 2022-03-22 ENCOUNTER — Other Ambulatory Visit: Payer: Self-pay

## 2022-03-22 ENCOUNTER — Encounter: Payer: Self-pay | Admitting: Orthopaedic Surgery

## 2022-03-22 DIAGNOSIS — M25552 Pain in left hip: Secondary | ICD-10-CM | POA: Diagnosis not present

## 2022-03-22 DIAGNOSIS — M25551 Pain in right hip: Secondary | ICD-10-CM | POA: Diagnosis not present

## 2022-03-22 DIAGNOSIS — M25851 Other specified joint disorders, right hip: Secondary | ICD-10-CM | POA: Diagnosis not present

## 2022-03-22 NOTE — Progress Notes (Signed)
The patient is well-known to me.  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  He has been dealing with bilateral hip pain with the right worse than left for some time now.  He says walking does hurt in the worst and work when he standing quite a bit hurts.  He does have pain in the groin from time to time bilaterally.  He denies any specific injuries.  He is very active.  We have seen him for other orthopedic issues in the past.  He has never had surgery on either hip. ? ?He does have good range of motion of both hips but significant pain at the extremes of rotation of both hips. ? ?Plain films of both hips and pelvis show evidence of femoral acetabular impingement.  There is flattening of the femoral head bilaterally at the superior lateral aspect and slight joint space narrowing. ? ?I talked to the patient about his x-rays and described what femoral acetabular impingement means.  I do feel it is worth him trying a one-time intra-articular steroid injection in his right hip since this is more symptomatic hip.  This can be set up under fluoroscopy by Dr. Ernestina Patches.  He agrees with this treatment plan.  I will see him back in about 6 weeks to see how he is doing overall and by then he will have the injection in his hip.  All questions and concerns were answered and addressed. ?

## 2022-03-30 ENCOUNTER — Other Ambulatory Visit: Payer: Self-pay

## 2022-03-30 ENCOUNTER — Other Ambulatory Visit: Payer: Self-pay | Admitting: Orthopaedic Surgery

## 2022-03-30 ENCOUNTER — Telehealth: Payer: Self-pay | Admitting: Physical Medicine and Rehabilitation

## 2022-03-30 ENCOUNTER — Telehealth: Payer: Self-pay | Admitting: Orthopaedic Surgery

## 2022-03-30 DIAGNOSIS — M25551 Pain in right hip: Secondary | ICD-10-CM

## 2022-03-30 MED ORDER — TRAMADOL HCL 50 MG PO TABS
50.0000 mg | ORAL_TABLET | Freq: Four times a day (QID) | ORAL | 0 refills | Status: DC | PRN
Start: 2022-03-30 — End: 2022-05-03

## 2022-03-30 NOTE — Telephone Encounter (Signed)
Pt called and was wondering if he could get something for his hip pain until he can see [place] on 04/25? ?

## 2022-03-30 NOTE — Telephone Encounter (Signed)
Pt called requesting a call back from [facility] for right hip injection. Referral is errored and per Autumn H will send corrected referral. Please call pt at [phone]. ?

## 2022-03-30 NOTE — Telephone Encounter (Signed)
Please advise 

## 2022-04-19 ENCOUNTER — Encounter: Payer: Self-pay | Admitting: Physical Medicine and Rehabilitation

## 2022-04-19 ENCOUNTER — Ambulatory Visit: Payer: Self-pay

## 2022-04-19 ENCOUNTER — Ambulatory Visit (INDEPENDENT_AMBULATORY_CARE_PROVIDER_SITE_OTHER): Payer: BC Managed Care – PPO | Admitting: Physical Medicine and Rehabilitation

## 2022-04-19 DIAGNOSIS — M25551 Pain in right hip: Secondary | ICD-10-CM | POA: Diagnosis not present

## 2022-04-19 MED ORDER — BUPIVACAINE HCL 0.25 % IJ SOLN
4.0000 mL | INTRAMUSCULAR | Status: AC | PRN
  Administered 2022-04-19: 4 mL via INTRA_ARTICULAR

## 2022-04-19 MED ORDER — TRIAMCINOLONE ACETONIDE 40 MG/ML IJ SUSP
60.0000 mg | INTRAMUSCULAR | Status: AC | PRN
  Administered 2022-04-19: 60 mg via INTRA_ARTICULAR

## 2022-04-19 NOTE — Progress Notes (Signed)
? ?  Blake Roberts - 56 y.o. male MRN [id number]  Date of birth: 1966/09/13 ? ?Office Visit Note: ?Visit Date: 04/19/2022 ?PCP: Wendie Agreste, MD ?Referred by: Wendie Agreste, MD ? ?Subjective: ?Chief Complaint  ?Patient presents with  ? Right Hip - Pain  ? ?HPI:  Blake Roberts is a 56 y.o. male who comes in today at the request of Dr. Jean Rosenthal for planned Right anesthetic hip arthrogram with fluoroscopic guidance.  The patient has failed conservative care including home exercise, medications, time and activity modification.  This injection will be diagnostic and hopefully therapeutic.  Please see requesting physician notes for further details and justification.  ? ?ROS Otherwise per HPI. ? ?Assessment & Plan: ?Visit Diagnoses:  ?  ICD-10-CM   ?1. Pain in right hip  M25.551 Large Joint Inj: R hip joint  ?  XR C-ARM NO REPORT  ?  ?  ?Plan: No additional findings.  ? ?Meds & Orders: No orders of the defined types were placed in this encounter. ?  ?Orders Placed This Encounter  ?Procedures  ? Large Joint Inj: R hip joint  ? XR C-ARM NO REPORT  ?  ?Follow-up: No follow-ups on file.  ? ?Procedures: ?Large Joint Inj: R hip joint on 04/19/2022 8:27 AM ?Indications: diagnostic evaluation and pain ?Details: 22 G 3.5 in needle, fluoroscopy-guided anterior approach ? ?Arthrogram: No ? ?Medications: 4 mL bupivacaine 0.25 %; 60 mg triamcinolone acetonide 40 MG/ML ?Outcome: tolerated well, no immediate complications ? ?There was excellent flow of contrast producing a partial arthrogram of the hip. The patient did have relief of symptoms during the anesthetic phase of the injection. ?Procedure, treatment alternatives, risks and benefits explained, specific risks discussed. Consent was given by the patient. Immediately prior to procedure a time out was called to verify the correct patient, procedure, equipment, support staff and site/side marked as required. Patient was prepped and draped in the usual sterile  fashion.  ? ?  ?   ? ?Clinical History: ?No specialty comments available.  ? ? ? ?Objective:  VS:  HT:    WT:   BMI:     BP:   HR: bpm  TEMP: ( )  RESP:  ?Physical Exam  ? ?Imaging: ?No results found. ?

## 2022-04-19 NOTE — Progress Notes (Signed)
Pt state right hip pain. Pt state walking and standing makes the pain worse. Pt state he takes over the counter pain meds to help ease her pain. ? ?Numeric Pain Rating Scale and Functional Assessment ?Average Pain 2 ? ? ?In the last MONTH (on 0-10 scale) has pain interfered with the following? ? ?1. General activity like being  able to carry out your everyday physical activities such as walking, climbing stairs, carrying groceries, or moving a chair?  ?Rating(7) ? ? -BT, -Dye Allergies. ? ?

## 2022-05-03 ENCOUNTER — Ambulatory Visit: Payer: BC Managed Care – PPO | Admitting: Orthopaedic Surgery

## 2022-05-03 ENCOUNTER — Encounter: Payer: Self-pay | Admitting: Orthopaedic Surgery

## 2022-05-03 DIAGNOSIS — M25551 Pain in right hip: Secondary | ICD-10-CM | POA: Diagnosis not present

## 2022-05-03 DIAGNOSIS — M25851 Other specified joint disorders, right hip: Secondary | ICD-10-CM

## 2022-05-03 MED ORDER — PREDNISONE 50 MG PO TABS: ORAL_TABLET | ORAL | 0 refills | Status: DC

## 2022-05-03 MED ORDER — TIZANIDINE HCL 4 MG PO TABS
4.0000 mg | ORAL_TABLET | Freq: Three times a day (TID) | ORAL | 0 refills | Status: DC | PRN
Start: 2022-05-03 — End: 2023-05-11

## 2022-05-03 MED ORDER — TRAMADOL HCL 50 MG PO TABS: 50.0000 mg | ORAL_TABLET | Freq: Four times a day (QID) | ORAL | 0 refills | Status: DC | PRN

## 2022-05-03 NOTE — Progress Notes (Signed)
Blake Roberts comes in today after having an intra-articular steroid injection in his right hip for femoral acetabular impingement.  He says that hip injection helped greatly and he is doing well.  He did torque his back over the weekend and has been having some spasms. ? ?On examination his right hip moves smoothly and fluidly.  He does have known femoral acetabular impingement. ? ?I will send in some Zanaflex, tramadol and 5 days of prednisone which will help his back.  He feels like he can work this week.  However, if he feels like that his back is limiting him, I can give him a note for work to keep him out of work for a week to help things calm down. ?

## 2022-05-05 ENCOUNTER — Telehealth: Payer: Self-pay | Admitting: Orthopaedic Surgery

## 2022-05-05 NOTE — Telephone Encounter (Signed)
Please advise 

## 2022-05-05 NOTE — Telephone Encounter (Signed)
Note competed and sent to pt's mychart ?

## 2022-05-05 NOTE — Telephone Encounter (Signed)
Pt called. He wants to pick one up. Placed at front desk ?

## 2022-05-05 NOTE — Telephone Encounter (Signed)
PT is calling to get a work note --he thought he could work with the back issue -but he cant  ? ?Please call the pt  ?

## 2022-05-11 ENCOUNTER — Encounter: Payer: Self-pay | Admitting: Physical Medicine & Rehabilitation

## 2022-05-24 ENCOUNTER — Other Ambulatory Visit: Payer: Self-pay | Admitting: Family Medicine

## 2022-05-24 DIAGNOSIS — I10 Essential (primary) hypertension: Secondary | ICD-10-CM

## 2022-06-20 ENCOUNTER — Encounter
Payer: BC Managed Care – PPO | Attending: Physical Medicine & Rehabilitation | Admitting: Physical Medicine & Rehabilitation

## 2022-06-20 ENCOUNTER — Ambulatory Visit: Payer: BC Managed Care – PPO | Admitting: Family Medicine

## 2022-06-20 ENCOUNTER — Encounter: Payer: Self-pay | Admitting: Physical Medicine & Rehabilitation

## 2022-06-20 ENCOUNTER — Encounter: Payer: Self-pay | Admitting: Family Medicine

## 2022-06-20 VITALS — BP 146/80 | HR 108 | Temp 98.2°F | Resp 16 | Ht 71.0 in | Wt 227.0 lb

## 2022-06-20 VITALS — BP 160/99 | HR 114 | Ht 71.0 in | Wt 228.0 lb

## 2022-06-20 DIAGNOSIS — R5383 Other fatigue: Secondary | ICD-10-CM | POA: Diagnosis not present

## 2022-06-20 DIAGNOSIS — Z1211 Encounter for screening for malignant neoplasm of colon: Secondary | ICD-10-CM

## 2022-06-20 DIAGNOSIS — R7989 Other specified abnormal findings of blood chemistry: Secondary | ICD-10-CM | POA: Diagnosis not present

## 2022-06-20 DIAGNOSIS — E782 Mixed hyperlipidemia: Secondary | ICD-10-CM | POA: Diagnosis not present

## 2022-06-20 DIAGNOSIS — I10 Essential (primary) hypertension: Secondary | ICD-10-CM | POA: Diagnosis not present

## 2022-06-20 DIAGNOSIS — E291 Testicular hypofunction: Secondary | ICD-10-CM | POA: Diagnosis not present

## 2022-06-20 DIAGNOSIS — M1610 Unilateral primary osteoarthritis, unspecified hip: Secondary | ICD-10-CM | POA: Insufficient documentation

## 2022-06-20 DIAGNOSIS — M25859 Other specified joint disorders, unspecified hip: Secondary | ICD-10-CM | POA: Insufficient documentation

## 2022-06-20 DIAGNOSIS — Z23 Encounter for immunization: Secondary | ICD-10-CM | POA: Diagnosis not present

## 2022-06-20 DIAGNOSIS — M47816 Spondylosis without myelopathy or radiculopathy, lumbar region: Secondary | ICD-10-CM | POA: Diagnosis not present

## 2022-06-20 DIAGNOSIS — N529 Male erectile dysfunction, unspecified: Secondary | ICD-10-CM | POA: Diagnosis not present

## 2022-06-20 LAB — COMPREHENSIVE METABOLIC PANEL
ALT: 37 U/L (ref 0–53)
AST: 26 U/L (ref 0–37)
Albumin: 4.5 g/dL (ref 3.5–5.2)
Alkaline Phosphatase: 57 U/L (ref 39–117)
BUN: 15 mg/dL (ref 6–23)
CO2: 26 mEq/L (ref 19–32)
Calcium: 9.6 mg/dL (ref 8.4–10.5)
Chloride: 98 mEq/L (ref 96–112)
Creatinine, Ser: 1.12 mg/dL (ref 0.40–1.50)
GFR: 73.4 mL/min (ref >60.00)
Glucose, Bld: 105 mg/dL — ABNORMAL HIGH (ref 70–99)
Potassium: 3.6 mEq/L (ref 3.5–5.1)
Sodium: 137 mEq/L (ref 135–145)
Total Bilirubin: 0.6 mg/dL (ref 0.2–1.2)
Total Protein: 7.7 g/dL (ref 6.0–8.3)

## 2022-06-20 LAB — TESTOSTERONE: Testosterone: 176.45 ng/dL — ABNORMAL LOW (ref 300.00–890.00)

## 2022-06-20 LAB — CBC
HCT: 41.1 % (ref 39.0–52.0)
Hemoglobin: 14.3 g/dL (ref 13.0–17.0)
MCHC: 34.9 g/dL (ref 30.0–36.0)
MCV: 95.8 fl (ref 78.0–100.0)
Platelets: 288 10*3/uL (ref 150.0–400.0)
RBC: 4.29 Mil/uL (ref 4.22–5.81)
RDW: 13.5 % (ref 11.5–15.5)
WBC: 9.9 10*3/uL (ref 4.0–10.5)

## 2022-06-20 LAB — LIPID PANEL
Cholesterol: 187 mg/dL (ref 0–200)
HDL: 67.3 mg/dL (ref >39.00)
NonHDL: 120.12
Total CHOL/HDL Ratio: 3
Triglycerides: 283 mg/dL — ABNORMAL HIGH (ref 0.0–149.0)
VLDL: 56.6 mg/dL — ABNORMAL HIGH (ref 0.0–40.0)

## 2022-06-20 LAB — TSH: TSH: 4.87 u[IU]/mL (ref 0.35–5.50)

## 2022-06-20 LAB — LDL CHOLESTEROL, DIRECT: Direct LDL: 94 mg/dL

## 2022-06-20 LAB — FERRITIN: Ferritin: 492.2 ng/mL — ABNORMAL HIGH (ref 22.0–322.0)

## 2022-06-20 MED ORDER — HYDROCHLOROTHIAZIDE 25 MG PO TABS: 25.0000 mg | ORAL_TABLET | Freq: Every day | ORAL | 1 refills | Status: DC

## 2022-06-20 MED ORDER — ATORVASTATIN CALCIUM 20 MG PO TABS: ORAL_TABLET | ORAL | 1 refills | Status: DC

## 2022-06-20 MED ORDER — AMLODIPINE BESYLATE 10 MG PO TABS: ORAL_TABLET | ORAL | 1 refills | Status: DC

## 2022-06-20 MED ORDER — DULOXETINE HCL 30 MG PO CPEP: 30.0000 mg | ORAL_CAPSULE | Freq: Every day | ORAL | 3 refills | Status: DC

## 2022-06-20 MED ORDER — DICLOFENAC SODIUM 1 % EX GEL: 4.0000 g | Freq: Four times a day (QID) | CUTANEOUS | Status: DC

## 2022-06-20 NOTE — Progress Notes (Signed)
Subjective:    Patient ID: Blake Roberts, male    DOB: 1966/08/15, 56 y.o.   MRN: [id number]  HPI 56 year old male with past medical history of hypertension, GERD, bilateral shoulder surgeries who is here for hip pain.  Patient has been having hip pain worse on the right for several years now.  He does not have significant pain at rest but when he is active and walking the pain becomes severe and limiting.  His hips become stiff when he has been sitting too long as well.  Patient is very active.  Pain is sharp and stabbing and shoots down his thighs.  Denies any surgeries or injuries to his hip in the past.  Patient had steroid injections of his bilateral hips by Dr. Ernestina Patches and reports this did reduce his pain to a moderate degree.  He was also recently prescribed short course of tramadol which helped with his pain at night.  He also reports chronic history of lower back pain.  Reports he had back injections several years ago that helped, ESI?  He uses for 800 mg total throughout the day of ibuprofen some days to help with the pain as well.  He has not had good results with Tylenol.  Zanaflex also provided some benefit to his back pain. Patient reports he had surgery on his right shoulder where he tore 4 tendons in 2015.  He also reports having a left shoulder surgery last year.  Like to attempt all possible nonsurgical treatments before considering surgery.   Pain Inventory Average Pain 6 Pain Right Now 1 My pain is sharp and stabbing  In the last 24 hours, has pain interfered with the following? General activity 6 Relation with others 4 Enjoyment of life 6 What TIME of day is your pain at its worst? varies Sleep (in general) Fair  Pain is worse with: walking, inactivity, standing, and some activites Pain improves with: medication and tramadol was helping but does not have any more Relief from Meds:  not answered  walk without assistance how many minutes can you walk? 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  no  Orthopedist Dr Ninfa Linden Laurence Spates    Family History  Problem Relation Age of Onset   Breast cancer Mother    Lung cancer Mother    Diabetes Father        adopted father; no history of biological father   Heart disease Father    Hyperlipidemia Father    Hypertension Father    Kidney disease Father    Heart Problems Brother        has pacemaker also handicapped   Pancreatic cancer Maternal Uncle        age early 86's   Colon cancer Neg Hx    Social History   Socioeconomic History   Marital status: Legally Separated    Spouse name: Not on file   Number of children: 0   Years of education: BA   Highest education level: Not on file  Occupational History   Occupation: paramedic  Tobacco Use   Smoking status: Never   Smokeless tobacco: Never  Substance and Sexual Activity   Alcohol use: Yes    Comment: 4 beers a month,states heavy beer drinker in younger years   Drug use: No   Sexual activity: Yes  Other Topics Concern   Not on file  Social History Narrative   Lives at home alone   Separated, 2 step-daughters   Right-handed   Caffeine: 2 diet Cokes per day   Social Determinants of Health   Financial Resource Strain: Not on file  Food Insecurity: Not on file  Transportation Needs: Not on file  Physical Activity: Not on file  Stress: Not on file  Social Connections: Not on file   Past Surgical History:  Procedure Laterality Date   KNEE ARTHROSCOPY  AGE 43 (APPROX)   RIGHT KNEE   KNEE ARTHROSCOPY  07/13/2012   Procedure: ARTHROSCOPY KNEE;  Surgeon: Magnus Sinning, MD;  Location: [facility];  Service: Orthopedics;  Laterality: Right;  WITH PARTIAL MEDIAL MENISECTOMY AND REMOVAL OF LOOSE BODIES   torn rotator cuff     Past Medical History:  Diagnosis Date   Acute meniscal tear of knee RIGHT KNEE   Anxiety    Arthritis  RIGHT KNEE   Bulging lumbar disc L5 - S1   Depression    Hemochromatosis    Hyperlipidemia    Hypertension    BP (!) 160/99   Pulse (!) 114   Ht '5\' 11"'$  (1.803 m)   Wt 228 lb (103.4 kg)   BMI 31.80 kg/m   Opioid Risk Score:   Fall Risk Score:  `1  Depression screen [facility] 2/9     06/20/2022   10:11 AM 01/10/2022    3:34 PM 09/03/2020    4:16 PM 06/22/2020    2:38 PM 05/29/2020   11:17 AM 10/28/2019   11:32 AM 09/06/2019    4:00 PM  Depression screen PHQ 2/9  Decreased Interest 0 0 0 0 0 0 0  Down, Depressed, Hopeless 0 0 0 0 0 0 0  PHQ - 2 Score 0 0 0 0 0 0 0  Altered sleeping 0        Tired, decreased energy 0        Change in appetite 0        Feeling bad or failure about yourself  0        Trouble concentrating 0        Moving slowly or fidgety/restless 0        Suicidal thoughts 0        PHQ-9 Score 0           Review of Systems  Constitutional: Negative.   HENT: Negative.    Eyes: Negative.   Respiratory: Negative.    Cardiovascular: Negative.   Gastrointestinal: Negative.   Endocrine: Negative.   Genitourinary: Negative.   Musculoskeletal:  Positive for gait problem.  Skin: Negative.   Allergic/Immunologic: Negative.   Hematological: Negative.   Psychiatric/Behavioral: Negative.    All other systems reviewed and are negative.      Objective:   Physical Exam  Gen: no distress, normal appearing HEENT: oral mucosa pink and moist, NCAT Cardio: Reg rate Chest: normal effort, normal rate of breathing Abd: soft, non-distended Ext: no edema Psych: pleasant, normal affect Skin: intact Neuro: Comes for, follows commands, sensation intact light touch in all 4 extremities, strength 5 out of 5 in all 4 extremities, reflexes normal and symmetric  Musculoskeletal: Tone normal No significant tenderness to palpation over paraspinal muscles, SI joint, anterior hip noted Mild pain at extremes of  external rotation of bilateral hips.  Moderate pain with FADIR test  b/l No significant knee tenderness noted Fabere negative SLR negative Facet loading negative Mildly limited L spine motion in  all directions   Hip xray b/l 03/22/22  An AP pelvis and lateral of the hips shows evidence of femoral acetabular  impingement.  There is slight joint space narrowing bilaterally but  flattening of the lateral femoral head with the right worse than the left.     MR spine 11/15/19  IMPRESSION: 1. Lumbar dextroscoliosis with moderately advanced multilevel disc and facet degeneration. 2. Moderate spinal stenosis at L3-4 and mild spinal stenosis at L2-3 and L4-5. 3. Left-sided synovial cyst at L3-4 with left lateral recess stenosis and potential left L4 nerve root impingement. 4. Moderate multilevel neural foraminal stenosis.   Assessment & Plan:  Bilateral femoral acetabular impingement  -Duloxetine 30 mg ordered -Referral for physical therapy  -Continue ibuprofen prn -Tramadol may be an option if duloxetine is not effective  Chronic lower back pain with lumbar spondylosis, spinal stenosis, multilevel foraminal stenosis without radiculopathy -Appears that his hips are a greater source of his pain right now -Duloxetine may provide benefit for his back pain as well

## 2022-06-22 ENCOUNTER — Encounter: Payer: Self-pay | Admitting: Physical Medicine & Rehabilitation

## 2022-06-23 ENCOUNTER — Encounter: Payer: Self-pay | Admitting: Family Medicine

## 2022-06-23 ENCOUNTER — Telehealth: Payer: Self-pay | Admitting: Family Medicine

## 2022-06-23 NOTE — Telephone Encounter (Signed)
Pt asking about medication please advise

## 2022-06-23 NOTE — Telephone Encounter (Signed)
error 

## 2022-06-25 ENCOUNTER — Other Ambulatory Visit: Payer: Self-pay | Admitting: Family Medicine

## 2022-06-25 DIAGNOSIS — R7989 Other specified abnormal findings of blood chemistry: Secondary | ICD-10-CM

## 2022-06-25 DIAGNOSIS — E291 Testicular hypofunction: Secondary | ICD-10-CM

## 2022-06-25 NOTE — Telephone Encounter (Signed)
Responded by lab result note.

## 2022-06-25 NOTE — Progress Notes (Signed)
See lab notes

## 2022-06-29 ENCOUNTER — Telehealth: Payer: Self-pay | Admitting: Hematology

## 2022-06-29 NOTE — Telephone Encounter (Signed)
Scheduled appt per 7/1 referral. Pt is aware of appt date and time. Pt is aware to arrive 15 mins prior to appt time and to bring and updated insurance card. Pt is aware of appt location.

## 2022-06-30 ENCOUNTER — Other Ambulatory Visit (INDEPENDENT_AMBULATORY_CARE_PROVIDER_SITE_OTHER): Payer: BC Managed Care – PPO

## 2022-06-30 DIAGNOSIS — E291 Testicular hypofunction: Secondary | ICD-10-CM

## 2022-06-30 LAB — TESTOSTERONE: Testosterone: 230.42 ng/dL — ABNORMAL LOW (ref 300.00–890.00)

## 2022-06-30 NOTE — Therapy (Signed)
OUTPATIENT PHYSICAL THERAPY EVALUATION   Patient Name: Blake Roberts MRN: [id number] DOB:1966/03/28, 56 y.o., male Today's Date: 07/01/2022   PT End of Session - 07/01/22 0803     Visit Number 1    Number of Visits 20    Date for PT Re-Evaluation 09/09/22    Authorization Type [employer] $70 copay    PT Start Time 0803    PT Stop Time 0840    PT Time Calculation (min) 37 min    Activity Tolerance Patient tolerated treatment well    Behavior During Therapy WFL for tasks assessed/performed             Past Medical History:  Diagnosis Date   Acute meniscal tear of knee RIGHT KNEE   Anxiety    Arthritis RIGHT KNEE   Bulging lumbar disc L5 - S1   Depression    Hemochromatosis    Hyperlipidemia    Hypertension    Past Surgical History:  Procedure Laterality Date   KNEE ARTHROSCOPY  AGE 58 (APPROX)   RIGHT KNEE   KNEE ARTHROSCOPY  07/13/2012   Procedure: ARTHROSCOPY KNEE;  Surgeon: Magnus Sinning, MD;  Location: [facility];  Service: Orthopedics;  Laterality: Right;  WITH PARTIAL MEDIAL MENISECTOMY AND REMOVAL OF LOOSE BODIES   torn rotator cuff     Patient Active Problem List   Diagnosis Date Noted   Hyperlipidemia 05/10/2017   Erectile dysfunction 05/10/2017   Memory difficulty 09/26/2016   Hemochromatosis 07/11/2016   Episodic lightheadedness 12/25/2015   Anemia, unspecified 12/25/2015   Chronic left-sided low back pain with sciatica 07/15/2015   Essential hypertension 02/15/2015   Rotator cuff tear 06/09/2014   GERD (gastroesophageal reflux disease) 05/13/2014    PCP: Wendie Agreste MD  REFERRING PROVIDER: Jennye Boroughs, MD  REFERRING DIAG: M16.10 (ICD-10-CM) - Hip arthritis  THERAPY DIAG:  Other low back pain  Pain in left hip  Pain in right hip  Stiffness of left hip, not elsewhere classified  Stiffness of right hip, not elsewhere classified  Muscle weakness (generalized)  Difficulty in walking, not elsewhere  classified  Rationale for Evaluation and Treatment Rehabilitation  ONSET DATE: 06/25/2021 (over a year)  SUBJECTIVE:   SUBJECTIVE STATEMENT: Pt indicated complaints in back and bilateral hips with standing/walking.   Pt reported insidious onset of symptoms.  Pt indicated resting pain goes away mostly.  Pt indicated sometimes having sleep trouble but nothing routinely.   PERTINENT HISTORY: Depression, hyperlipidemia, HTN.  Has high ferritin level (checking into with family MD/oncology.  Pt also indicated he has Rt knee pain history  PAIN:  NPRS scale: at worst 6/10, at best 0/10 Pain location: back, hips (Rt) Pain description: dull, achy Aggravating factors: standing 5-10 mins or longer, walking prolonged Relieving factors: OTC medicine  PRECAUTIONS: None  WEIGHT BEARING RESTRICTIONS No  FALLS:  Has patient fallen in last 6 months? Yes - 2 falls.  One fall impacted some back pain that led to   LIVING ENVIRONMENT:  Lives in: House/apartment Stairs:stairs 3-4 porch (no rails)   OCCUPATION: Works as paramedic  PLOF: Independent, workouts  PATIENT GOALS  Reduce pain, get back into working out (elliptical, general UE/LE strengthening), standing/walking ability improved   OBJECTIVE:   DIAGNOSTIC FINDINGS: 07/01/2022 review:  MR spine 11/15/19   IMPRESSION: 1. Lumbar dextroscoliosis with moderately advanced multilevel disc and facet degeneration. 2. Moderate spinal stenosis at L3-4 and mild spinal stenosis at L2-3 and L4-5. 3. Left-sided synovial cyst at L3-4 with  left lateral recess stenosis and potential left L4 nerve root impingement. 4. Moderate multilevel neural foraminal stenosis.  PATIENT SURVEYS:  07/01/2022 FOTO intake:   47   predicted:   63  COGNITION: 07/01/2022 Overall cognitive status: Within functional limits for tasks assessed     MUSCLE LENGTH: 07/01/2022 (+) thomas test bilateral  POSTURE:  07/01/2022  Forward trunk posture,   Back/ LOWER EXTREMITY  ROM:   07/01/2022: no centralization/peripherialization.  End range overpressure for hip revealed simliar movement amount.  Joint mobility hypomobile in lumbar and hip in all directions.  Active ROM  Right 07/01/2022 Left 07/01/2022  Hip flexion 115, measured in supine  115 measured in supine  Hip extension    Hip abduction    Hip adduction    Hip internal rotation 0 measured in supine 90 deg flexion 10 measured in supine 90 deg flexion  Hip external rotation 55 measured in supine 90 deg flexion 55 measured in supine 90 deg flexion  Knee flexion    Knee extension        Lumbar flexion To mid shin   Lumbar extension To neutral c pain lumbar/Rt hip buttock          (Blank rows = not tested)  LOWER EXTREMITY MMT:  MMT Right 07/01/2022 Left 07/01/2022  Hip flexion 5/5 5/5  Hip extension 4/5 4/5  Hip abduction    Hip adduction    Hip internal rotation    Hip external rotation    Knee flexion 5/5 5/5  Knee extension 5/5 5/5  Ankle dorsiflexion 5/5 5/5  Ankle plantarflexion    Ankle inversion    Ankle eversion     (Blank rows = not tested)  LOWER EXTREMITY SPECIAL TESTS:   07/01/2022(-) slump bilateral.  (+) FABER bilateral hip  FUNCTIONAL TESTS:  07/01/2022 18 inch chair transfer s UE assist on 1st try  GAIT: 07/01/2022  Independent ambulation c forward trunk lean, no hip extension bilateral in gait cycle, wider base of support.     TODAY'S TREATMENT: 07/01/2022 Therex:  HEP instruction/performance c cues for techniques, handout provided.  Trial set performed of each for comprehension and symptom assessment.  See below for exercise list.   PATIENT EDUCATION:  07/01/2022 Education details: HEP, POC Person educated: Patient Education method: Explanation, Demonstration, Verbal cues, and Handouts Education comprehension: verbalized understanding, returned demonstration, and verbal cues required   HOME EXERCISE PROGRAM: Access Code: [email or web address] URL:  [email or web address] Date: 07/01/2022 Prepared by: Scot Jun  Exercises - Supine Lower Trunk Rotation  - 2-3 x daily - 7 x weekly - 1 sets - 3-5 reps - 15 hold - Supine Piriformis Stretch with Foot on Ground  - 2-3 x daily - 7 x weekly - 1 sets - 3-5 reps - 30 hold - Supine Figure 4 Piriformis Stretch  - 2-3 x daily - 7 x weekly - 1 sets - 3-5 reps - 30 hold - Supine Bridge  - 1-2 x daily - 7 x weekly - 1-2 sets - 10-15 reps - 2 hold - Standing Lumbar Extension with Counter  - 3-5 x daily - 7 x weekly - 1 sets - 5-10 reps - Standing Scapular Retraction  - 3-5 x daily - 7 x weekly - 1 sets - 5-10 reps  ASSESSMENT:  CLINICAL IMPRESSION: Patient is a 56 y.o. who comes to clinic with complaints of back, bilateral hip pain with mobility, strength deficits that impair their ability to perform usual daily and recreational functional activities without increase  difficulty/symptoms at this time.  Patient to benefit from skilled PT services to address impairments and limitations to improve to previous level of function without restriction secondary to condition.    OBJECTIVE IMPAIRMENTS Abnormal gait, decreased activity tolerance, decreased endurance, decreased mobility, difficulty walking, decreased ROM, decreased strength, hypomobility, increased fascial restrictions, impaired perceived functional ability, impaired flexibility, improper body mechanics, postural dysfunction, and pain.   ACTIVITY LIMITATIONS carrying, lifting, bending, standing, squatting, sleeping, stairs, transfers, and locomotion level  PARTICIPATION LIMITATIONS: cleaning, laundry, interpersonal relationship, driving, shopping, community activity, occupation, and exercise  PERSONAL FACTORS  Depression, hyperlipidemia, HTN  are also affecting patient's functional outcome.   REHAB POTENTIAL: Good  CLINICAL DECISION MAKING: Stable/uncomplicated  EVALUATION COMPLEXITY: Low   GOALS: Goals reviewed with  patient? Yes  Short term PT Goals (target date for Short term goals are 3 weeks 07/22/2022) Patient will demonstrate independent use of home exercise program to maintain progress from in clinic treatments. Goal status: New   Long term PT goals (target dates for all long term goals are 10 weeks  09/09/2022 )  1. Patient will demonstrate/report pain at worst less than or equal to 2/10 to facilitate minimal limitation in daily activity secondary to pain symptoms. Goal status: New  2. Patient will demonstrate independent use of home exercise program to facilitate ability to maintain/progress functional gains from skilled physical therapy services. Goal status: New  3. Patient will demonstrate FOTO outcome > or = 63 % to indicate reduced disability due to condition. Goal status: New  4.  Patient will demonstrate improvement in hip AROM bilateral > 15 degrees each direction to facilitate progress in hip mobility for functional standing/walking.  Goal status: New  5.  Patient will demonstrate lumbar extension AROM > or = 50% to facilitate ability to perform standing/walking s restriction.    Goal status: New  6.  Patient will demonstrate/report ability to perform workout activity s restriction due to symptoms.   Goal status: New     PLAN: PT FREQUENCY: 1-2x/week  PT DURATION: 10 weeks  PLANNED INTERVENTIONS: Therapeutic exercises, Therapeutic activity, Neuro Muscular re-education, Balance training, Gait training, Patient/Family education, Joint mobilization, Stair training, DME instructions, Dry Needling, Electrical stimulation, Cryotherapy, Moist heat, Taping, Ultrasound, Ionotophoresis '4mg'$ /ml Dexamethasone, and Manual therapy.  All included unless contraindicated  PLAN FOR NEXT SESSION: Review HEP knowledge/reaction.   Lumbar and hip joint mobilization for motion gains.     Scot Jun, PT, DPT, OCS, ATC 07/01/22  9:26 AM

## 2022-07-01 ENCOUNTER — Encounter: Payer: Self-pay | Admitting: Rehabilitative and Restorative Service Providers"

## 2022-07-01 ENCOUNTER — Ambulatory Visit: Payer: BC Managed Care – PPO | Admitting: Rehabilitative and Restorative Service Providers"

## 2022-07-01 ENCOUNTER — Other Ambulatory Visit: Payer: Self-pay

## 2022-07-01 DIAGNOSIS — M25552 Pain in left hip: Secondary | ICD-10-CM

## 2022-07-01 DIAGNOSIS — M25652 Stiffness of left hip, not elsewhere classified: Secondary | ICD-10-CM | POA: Diagnosis not present

## 2022-07-01 DIAGNOSIS — M5459 Other low back pain: Secondary | ICD-10-CM

## 2022-07-01 DIAGNOSIS — M25651 Stiffness of right hip, not elsewhere classified: Secondary | ICD-10-CM

## 2022-07-01 DIAGNOSIS — R262 Difficulty in walking, not elsewhere classified: Secondary | ICD-10-CM

## 2022-07-01 DIAGNOSIS — M25551 Pain in right hip: Secondary | ICD-10-CM

## 2022-07-01 DIAGNOSIS — M6281 Muscle weakness (generalized): Secondary | ICD-10-CM

## 2022-07-04 ENCOUNTER — Telehealth: Payer: Self-pay | Admitting: Family Medicine

## 2022-07-04 NOTE — Telephone Encounter (Signed)
Caller name: Keiden (pt)  On DPR? :yes/no: Yes  Call back number: [phone]  Provider they see: Dr. Carlota Raspberry  Reason for call: pt called wondering about urology referral. Who is he being reffered to and should he call to schedule apptmt? Or will they call him?

## 2022-07-04 NOTE — Telephone Encounter (Signed)
Spoke w/ Blake Roberts and advised that his referral was in . I also advised that Danae Chen will contact him once she makes his apt .He will call back next week to check status per Blake Roberts .

## 2022-07-12 ENCOUNTER — Ambulatory Visit: Payer: BC Managed Care – PPO | Admitting: Rehabilitative and Restorative Service Providers"

## 2022-07-12 ENCOUNTER — Encounter: Payer: Self-pay | Admitting: Rehabilitative and Restorative Service Providers"

## 2022-07-12 DIAGNOSIS — M5459 Other low back pain: Secondary | ICD-10-CM | POA: Diagnosis not present

## 2022-07-12 DIAGNOSIS — M25552 Pain in left hip: Secondary | ICD-10-CM | POA: Diagnosis not present

## 2022-07-12 DIAGNOSIS — M25652 Stiffness of left hip, not elsewhere classified: Secondary | ICD-10-CM | POA: Diagnosis not present

## 2022-07-12 DIAGNOSIS — R262 Difficulty in walking, not elsewhere classified: Secondary | ICD-10-CM

## 2022-07-12 DIAGNOSIS — M25551 Pain in right hip: Secondary | ICD-10-CM

## 2022-07-12 DIAGNOSIS — M25651 Stiffness of right hip, not elsewhere classified: Secondary | ICD-10-CM

## 2022-07-12 DIAGNOSIS — M6281 Muscle weakness (generalized): Secondary | ICD-10-CM

## 2022-07-12 NOTE — Therapy (Signed)
OUTPATIENT PHYSICAL THERAPY TREATMENT NOTE   Patient Name: Blake Roberts MRN: [id number] DOB:1966-01-13, 56 y.o., male Today's Date: 07/12/2022  PCP: Wendie Agreste MD   REFERRING PROVIDER: Jennye Boroughs, MD  END OF SESSION:   PT End of Session - 07/12/22 0753     Visit Number 2    Number of Visits 20    Date for PT Re-Evaluation 09/09/22    Authorization Type [employer] $70 copay    PT Start Time 0757    PT Stop Time 0837    PT Time Calculation (min) 40 min    Activity Tolerance Patient tolerated treatment well    Behavior During Therapy WFL for tasks assessed/performed             Past Medical History:  Diagnosis Date   Acute meniscal tear of knee RIGHT KNEE   Anxiety    Arthritis RIGHT KNEE   Bulging lumbar disc L5 - S1   Depression    Hemochromatosis    Hyperlipidemia    Hypertension    Past Surgical History:  Procedure Laterality Date   KNEE ARTHROSCOPY  AGE 47 (APPROX)   RIGHT KNEE   KNEE ARTHROSCOPY  07/13/2012   Procedure: ARTHROSCOPY KNEE;  Surgeon: Magnus Sinning, MD;  Location: [facility];  Service: Orthopedics;  Laterality: Right;  WITH PARTIAL MEDIAL MENISECTOMY AND REMOVAL OF LOOSE BODIES   torn rotator cuff     Patient Active Problem List   Diagnosis Date Noted   Hyperlipidemia 05/10/2017   Erectile dysfunction 05/10/2017   Memory difficulty 09/26/2016   Hemochromatosis 07/11/2016   Episodic lightheadedness 12/25/2015   Anemia, unspecified 12/25/2015   Chronic left-sided low back pain with sciatica 07/15/2015   Essential hypertension 02/15/2015   Rotator cuff tear 06/09/2014   GERD (gastroesophageal reflux disease) 05/13/2014    REFERRING DIAG: M16.10 (ICD-10-CM) - Hip arthritis  ONSET DATE: 06/25/2021 (over a year)  THERAPY DIAG:  Other low back pain  Pain in left hip  Pain in right hip  Stiffness of left hip, not elsewhere classified  Stiffness of right hip, not elsewhere classified  Muscle weakness  (generalized)  Difficulty in walking, not elsewhere classified  Rationale for Evaluation and Treatment Rehabilitation  PERTINENT HISTORY: Depression, hyperlipidemia, HTN.  Has high ferritin level (checking into with family MD/oncology.  Pt also indicated he has Rt knee pain history  PRECAUTIONS: None  SUBJECTIVE: Pt indicated having times of feeling some improvement in motion slightly.  Pt indicated having Sunday having pain increase that made it difficult to move.  Pt indicated some exercise   PAIN:  NPRS scale:< 1/10 today upon arrival.  At worst 6-7/10 in hips/back.  Pain location: back, hips (Rt) Pain description: dull, achy Aggravating factors: standing 5-10 mins or longer, walking prolonged Relieving factors: OTC medicine   PATIENT GOALS  Reduce pain, get back into working out (elliptical, general UE/LE strengthening), standing/walking ability improved  OBJECTIVE:    DIAGNOSTIC FINDINGS: 07/01/2022 review:  MR spine 11/15/19   IMPRESSION: 1. Lumbar dextroscoliosis with moderately advanced multilevel disc and facet degeneration. 2. Moderate spinal stenosis at L3-4 and mild spinal stenosis at L2-3 and L4-5. 3. Left-sided synovial cyst at L3-4 with left lateral recess stenosis and potential left L4 nerve root impingement. 4. Moderate multilevel neural foraminal stenosis.   PATIENT SURVEYS:  07/01/2022 FOTO intake:   47   predicted:   63   COGNITION: 07/01/2022 Overall cognitive status: Within functional limits for tasks assessed  MUSCLE LENGTH: 07/01/2022 (+) thomas test bilateral   POSTURE:  07/01/2022  Forward trunk posture,    Back/ LOWER EXTREMITY ROM:                         07/01/2022: no centralization/peripherialization.  End range overpressure for hip revealed simliar movement amount.  Joint mobility hypomobile in lumbar and hip in all directions.  Active ROM   Right 07/01/2022 Left 07/01/2022 Right 07/12/2022 Left 07/12/2022  Hip  flexion 115, measured in supine  115 measured in supine    Hip extension        Hip abduction        Hip adduction        Hip internal rotation 0 measured in supine 90 deg flexion 10 measured in supine 90 deg flexion 12 measured in supine 90 deg flexion 12 measured in supine 90 deg flexion  Hip external rotation 55 measured in supine 90 deg flexion 55 measured in supine 90 deg flexion    Knee flexion        Knee extension                 Lumbar flexion To mid shin      Lumbar extension To neutral c pain lumbar/Rt hip buttock        25 % WFL             (Blank rows = not tested)   LOWER EXTREMITY MMT:   MMT Right 07/01/2022 Left 07/01/2022  Hip flexion 5/5 5/5  Hip extension 4/5 4/5  Hip abduction      Hip adduction      Hip internal rotation      Hip external rotation      Knee flexion 5/5 5/5  Knee extension 5/5 5/5  Ankle dorsiflexion 5/5 5/5  Ankle plantarflexion      Ankle inversion      Ankle eversion       (Blank rows = not tested)   LOWER EXTREMITY SPECIAL TESTS:   07/01/2022(-) slump bilateral.  (+) FABER bilateral hip   FUNCTIONAL TESTS:  07/01/2022 18 inch chair transfer s UE assist on 1st try   GAIT: 07/01/2022  Independent ambulation c forward trunk lean, no hip extension bilateral in gait cycle, wider base of support.        TODAY'S TREATMENT:  07/12/2022  Therex:    Nustep lvl 6 7 mins UE/LE   Supine bridge x 10    Supine figure 4 push away and pull towards 3 x 15 seconds bilateral   Sidelying clam shell x 10 bilateral (cues for home use)   Sidelying reverse clam shell x 10 bilateral ( cues for home use)   Manual:   G3-G4 inferior jt mobs, lateral joint mobs/distraction, PA mobs through shaft of femur, performed bilateral for mobility gains.   07/01/2022 Therex:            HEP instruction/performance c cues for techniques, handout provided.  Trial set performed of each for comprehension and symptom assessment.  See below for exercise list.      PATIENT EDUCATION:  07/12/2022 Education details: HEP  Person educated: Patient Education method: Consulting civil engineer, Media planner, Verbal cues, and Handouts Education comprehension: verbalized understanding, returned demonstration, and verbal cues required     HOME EXERCISE PROGRAM: Access Code: [email or web address] URL: [email or web address] Date: 07/12/2022 Prepared by: Scot Jun  Exercises - Supine Lower Trunk Rotation  - 2-3 x daily - 7  x weekly - 1 sets - 3-5 reps - 15 hold - Supine Piriformis Stretch with Foot on Ground  - 2-3 x daily - 7 x weekly - 1 sets - 3-5 reps - 30 hold - Supine Figure 4 Piriformis Stretch  - 2-3 x daily - 7 x weekly - 1 sets - 3-5 reps - 30 hold - Supine Bridge  - 1-2 x daily - 7 x weekly - 1-2 sets - 10-15 reps - 2 hold - Standing Lumbar Extension with Counter  - 3-5 x daily - 7 x weekly - 1 sets - 5-10 reps - Standing Scapular Retraction  - 3-5 x daily - 7 x weekly - 1 sets - 5-10 reps - Clamshell  - 1-2 x daily - 7 x weekly - 3 sets - 10-15 reps - Sidelying Reverse Clamshell  - 1-2 x daily - 7 x weekly - 3 sets - 10-15 reps   ASSESSMENT:   CLINICAL IMPRESSION: Mild improvements in hip joint mobility noted compared to evaluation as well as lumbar extension.  Continued restriction in mobility which can impair and limit functional standing/walking and other movement of daily life.  Continued skilled PT services c focus on mobility improvements warranted.      OBJECTIVE IMPAIRMENTS Abnormal gait, decreased activity tolerance, decreased endurance, decreased mobility, difficulty walking, decreased ROM, decreased strength, hypomobility, increased fascial restrictions, impaired perceived functional ability, impaired flexibility, improper body mechanics, postural dysfunction, and pain.    ACTIVITY LIMITATIONS carrying, lifting, bending, standing, squatting, sleeping, stairs, transfers, and locomotion level   PARTICIPATION LIMITATIONS: cleaning, laundry,  interpersonal relationship, driving, shopping, community activity, occupation, and exercise   PERSONAL FACTORS  Depression, hyperlipidemia, HTN  are also affecting patient's functional outcome.    REHAB POTENTIAL: Good   CLINICAL DECISION MAKING: Stable/uncomplicated   EVALUATION COMPLEXITY: Low     GOALS: Goals reviewed with patient? Yes   Short term PT Goals (target date for Short term goals are 3 weeks 07/22/2022) Patient will demonstrate independent use of home exercise program to maintain progress from in clinic treatments. Goal status: on going - assessed 07/12/2022   Long term PT goals (target dates for all long term goals are 10 weeks  09/09/2022 )   1. Patient will demonstrate/report pain at worst less than or equal to 2/10 to facilitate minimal limitation in daily activity secondary to pain symptoms. Goal status: New   2. Patient will demonstrate independent use of home exercise program to facilitate ability to maintain/progress functional gains from skilled physical therapy services. Goal status: New   3. Patient will demonstrate FOTO outcome > or = 63 % to indicate reduced disability due to condition. Goal status: New   4.  Patient will demonstrate improvement in hip AROM bilateral > 15 degrees each direction to facilitate progress in hip mobility for functional standing/walking.  Goal status: New   5.  Patient will demonstrate lumbar extension AROM > or = 50% to facilitate ability to perform standing/walking s restriction.    Goal status: New   6.  Patient will demonstrate/report ability to perform workout activity s restriction due to symptoms.   Goal status: New         PLAN: PT FREQUENCY: 1-2x/week   PT DURATION: 10 weeks   PLANNED INTERVENTIONS: Therapeutic exercises, Therapeutic activity, Neuro Muscular re-education, Balance training, Gait training, Patient/Family education, Joint mobilization, Stair training, DME instructions, Dry Needling, Electrical  stimulation, Cryotherapy, Moist heat, Taping, Ultrasound, Ionotophoresis '4mg'$ /ml Dexamethasone, and Manual therapy.  All included unless  contraindicated   PLAN FOR NEXT SESSION: Lumbar and hip joint mobilization for motion gains continued   Scot Jun, PT, DPT, OCS, ATC 07/12/22  8:40 AM

## 2022-07-18 ENCOUNTER — Emergency Department (HOSPITAL_COMMUNITY): Payer: BC Managed Care – PPO

## 2022-07-18 ENCOUNTER — Other Ambulatory Visit: Payer: Self-pay

## 2022-07-18 ENCOUNTER — Encounter (HOSPITAL_COMMUNITY): Payer: Self-pay

## 2022-07-18 ENCOUNTER — Emergency Department (HOSPITAL_COMMUNITY)
Admission: EM | Admit: 2022-07-18 | Discharge: 2022-07-18 | Disposition: A | Discharge status: Home / Self Care | Payer: BC Managed Care – PPO | Attending: Emergency Medicine | Admitting: Emergency Medicine

## 2022-07-18 DIAGNOSIS — R42 Dizziness and giddiness: Secondary | ICD-10-CM | POA: Diagnosis not present

## 2022-07-18 DIAGNOSIS — R000 Tachycardia, unspecified: Secondary | ICD-10-CM | POA: Insufficient documentation

## 2022-07-18 DIAGNOSIS — R61 Generalized hyperhidrosis: Secondary | ICD-10-CM | POA: Diagnosis not present

## 2022-07-18 DIAGNOSIS — R079 Chest pain, unspecified: Secondary | ICD-10-CM | POA: Diagnosis not present

## 2022-07-18 DIAGNOSIS — R110 Nausea: Secondary | ICD-10-CM | POA: Insufficient documentation

## 2022-07-18 DIAGNOSIS — Z79899 Other long term (current) drug therapy: Secondary | ICD-10-CM | POA: Diagnosis not present

## 2022-07-18 DIAGNOSIS — I10 Essential (primary) hypertension: Secondary | ICD-10-CM | POA: Diagnosis not present

## 2022-07-18 DIAGNOSIS — R0789 Other chest pain: Secondary | ICD-10-CM | POA: Diagnosis not present

## 2022-07-18 LAB — COMPREHENSIVE METABOLIC PANEL
ALT: 31 U/L (ref 0–44)
AST: 28 U/L (ref 15–41)
Albumin: 4.1 g/dL (ref 3.5–5.0)
Alkaline Phosphatase: 48 U/L (ref 38–126)
Anion gap: 15 (ref 5–15)
BUN: 14 mg/dL (ref 6–20)
CO2: 21 mmol/L — ABNORMAL LOW (ref 22–32)
Calcium: 8.9 mg/dL (ref 8.9–10.3)
Chloride: 103 mmol/L (ref 98–111)
Creatinine, Ser: 0.98 mg/dL (ref 0.61–1.24)
GFR, Estimated: >60 mL/min (ref >60)
Glucose, Bld: 152 mg/dL — ABNORMAL HIGH (ref 70–99)
Potassium: 3.8 mmol/L (ref 3.5–5.1)
Sodium: 139 mmol/L (ref 135–145)
Total Bilirubin: 0.5 mg/dL (ref 0.3–1.2)
Total Protein: 7.5 g/dL (ref 6.5–8.1)

## 2022-07-18 LAB — MAGNESIUM: Magnesium: 2.2 mg/dL (ref 1.7–2.4)

## 2022-07-18 LAB — CBC WITH DIFFERENTIAL/PLATELET
Abs Immature Granulocytes: 0.11 10*3/uL — ABNORMAL HIGH (ref 0.00–0.07)
Basophils Absolute: 0.1 10*3/uL (ref 0.0–0.1)
Basophils Relative: 1 %
Eosinophils Absolute: 0.1 10*3/uL (ref 0.0–0.5)
Eosinophils Relative: 1 %
HCT: 42.2 % (ref 39.0–52.0)
Hemoglobin: 14.3 g/dL (ref 13.0–17.0)
Immature Granulocytes: 2 %
Lymphocytes Relative: 31 %
Lymphs Abs: 2.3 10*3/uL (ref 0.7–4.0)
MCH: 33.2 pg (ref 26.0–34.0)
MCHC: 33.9 g/dL (ref 30.0–36.0)
MCV: 97.9 fL (ref 80.0–100.0)
Monocytes Absolute: 0.6 10*3/uL (ref 0.1–1.0)
Monocytes Relative: 8 %
Neutro Abs: 4.3 10*3/uL (ref 1.7–7.7)
Neutrophils Relative %: 57 %
Platelets: 287 10*3/uL (ref 150–400)
RBC: 4.31 MIL/uL (ref 4.22–5.81)
RDW: 12.5 % (ref 11.5–15.5)
WBC: 7.5 10*3/uL (ref 4.0–10.5)
nRBC: 0 % (ref 0.0–0.2)

## 2022-07-18 LAB — TROPONIN I (HIGH SENSITIVITY)
Troponin I (High Sensitivity): 10 ng/L (ref <18)
Troponin I (High Sensitivity): 9 ng/L (ref <18)

## 2022-07-18 MED ORDER — ACETAMINOPHEN 325 MG PO TABS
650.0000 mg | ORAL_TABLET | Freq: Once | ORAL | Status: AC
Start: 2022-07-18 — End: 2022-07-18
  Administered 2022-07-18: 650 mg via ORAL
  Filled 2022-07-18: qty 2

## 2022-07-18 MED ORDER — CHLORDIAZEPOXIDE HCL 25 MG PO CAPS
50.0000 mg | ORAL_CAPSULE | Freq: Once | ORAL | Status: AC
  Administered 2022-07-18: 50 mg via ORAL
  Filled 2022-07-18: qty 2

## 2022-07-18 MED ORDER — LORAZEPAM 1 MG PO TABS
1.0000 mg | ORAL_TABLET | Freq: Once | ORAL | Status: AC
  Administered 2022-07-18: 1 mg via ORAL
  Filled 2022-07-18: qty 1

## 2022-07-18 MED ORDER — CHLORDIAZEPOXIDE HCL 25 MG PO CAPS: ORAL_CAPSULE | ORAL | 0 refills | Status: DC

## 2022-07-18 MED ORDER — LACTATED RINGERS IV BOLUS
1000.0000 mL | Freq: Once | INTRAVENOUS | Status: AC
  Administered 2022-07-18: 1000 mL via INTRAVENOUS

## 2022-07-18 MED ORDER — THIAMINE HCL 100 MG/ML IJ SOLN
100.0000 mg | Freq: Once | INTRAMUSCULAR | Status: AC
  Administered 2022-07-18: 100 mg via INTRAVENOUS
  Filled 2022-07-18: qty 2

## 2022-07-18 MED ORDER — NITROGLYCERIN 2 % TD OINT
1.0000 [in_us] | TOPICAL_OINTMENT | Freq: Once | TRANSDERMAL | Status: AC
Start: 2022-07-18 — End: 2022-07-18
  Administered 2022-07-18: 1 [in_us] via TOPICAL
  Filled 2022-07-18: qty 1

## 2022-07-18 NOTE — ED Notes (Signed)
Pt ambulated with a steady gait to restroom 

## 2022-07-18 NOTE — Discharge Instructions (Addendum)
There is a medication that was sent to your pharmacy called Librium.  This is a medication that allows you to stop using alcohol.  It is prescribed in a taper dosing.  Take as prescribed.  If you do resume drinking alcohol, please stop taking this medication.   Call the telephone number below to establish care with a cardiologist.  Return to the emergency department at any time, if you experience any new or worsening symptoms.

## 2022-07-18 NOTE — ED Notes (Signed)
Pt c/o chest pressure under L breast. Pt states he was eating breakfast at work and sudden CP with N and diaphoresis. PT denies SOB. Had previous episodes with a negative work-up.

## 2022-07-18 NOTE — ED Triage Notes (Signed)
Pt BIBA c/o L sided CP with N and feeling lightheaded at work.   1 nitro '4mg'$  zofran 324  ASA

## 2022-07-18 NOTE — ED Provider Notes (Signed)
[facility] EMERGENCY DEPARTMENT Provider Note   CSN: [id number] Arrival date & time: 07/18/22  1761     History  Chief Complaint  Patient presents with   Chest Pain    Blake Roberts is a 56 y.o. male.  HPI Patient presents for chest pain.  Medical history includes anemia, hemochromatosis, GERD, HTN, HLD, depression, anxiety.  Diagnosis of hemochromatosis is recent.  He has not yet undergone any phlebotomy.  Patient reports that he was in his normal state of health this morning.  He works for [employer].  When he got to work, he had an episode of lightheadedness.  This progressed to a left-sided chest tightness/cramping feeling.  He experienced nausea and diaphoresis.  EMS was called.  EMS provided 324 of ASA, 4 mg of Zofran, and 1 SL NTG.  Patient reports mild improvement in his pain following the NTG.  At worst it was 6/10 in severity.  Currently it is 4/10.  He denies any current nausea.  He reports 1 prior episode of similar symptoms.  He was seen at [facility] ED for that.  Per chart review, that was 1.5 years ago.  It does not appear that he has followed up with cardiology since then.    Home Medications Prior to Admission medications   Medication Sig Start Date End Date Taking? Authorizing Provider  chlordiazePOXIDE (LIBRIUM) 25 MG capsule '50mg'$  PO TID x 1D, then 25-'50mg'$  PO BID X 1D, then 25-'50mg'$  PO QD X 1D 07/18/22  Yes Godfrey Pick, MD  amLODipine (NORVASC) 10 MG tablet TAKE 1 TABLET(10 MG) BY MOUTH DAILY 06/20/22   Wendie Agreste, MD  atorvastatin (LIPITOR) 20 MG tablet TAKE 1 TABLET(20 MG) BY MOUTH DAILY 06/20/22   Wendie Agreste, MD  DULoxetine (CYMBALTA) 30 MG capsule Take 1 capsule (30 mg total) by mouth daily. 06/20/22   Jennye Boroughs, MD  hydrochlorothiazide (HYDRODIURIL) 25 MG tablet Take 1 tablet (25 mg total) by mouth daily. 06/20/22   Wendie Agreste, MD  predniSONE (DELTASONE) 50 MG tablet Take one tablet daily for 5 days. 05/03/22   Mcarthur Rossetti, MD  tadalafil (CIALIS) 20 MG tablet Take 0.5 tablets (10 mg total) by mouth daily as needed for erectile dysfunction. Patient not taking: Reported on 06/20/2022 01/10/22   Wendie Agreste, MD  tiZANidine (ZANAFLEX) 4 MG tablet Take 1 tablet (4 mg total) by mouth every 8 (eight) hours as needed for muscle spasms. 05/03/22   Mcarthur Rossetti, MD      Allergies    Ace inhibitors    Review of Systems   Review of Systems  Constitutional:  Positive for diaphoresis.  Respiratory:  Positive for chest tightness.   Cardiovascular:  Positive for chest pain.  Gastrointestinal:  Positive for nausea.  Neurological:  Positive for light-headedness.  All other systems reviewed and are negative.   Physical Exam Updated Vital Signs BP (!) 157/98   Pulse (!) 111   Temp 98 F (36.7 C) (Oral)   Resp 17   Ht '5\' 11"'$  (1.803 m)   Wt 99.8 kg   SpO2 96%   BMI 30.68 kg/m  Physical Exam Vitals and nursing note reviewed.  Constitutional:      General: He is not in acute distress.    Appearance: He is well-developed. He is not ill-appearing, toxic-appearing or diaphoretic.  HENT:     Head: Normocephalic and atraumatic.  Eyes:     Conjunctiva/sclera: Conjunctivae normal.  Neck:  Vascular: No JVD.  Cardiovascular:     Rate and Rhythm: Normal rate and regular rhythm.     Heart sounds: No murmur heard. Pulmonary:     Effort: Pulmonary effort is normal. No respiratory distress.     Breath sounds: Normal breath sounds. No decreased breath sounds, wheezing, rhonchi or rales.  Chest:     Chest wall: No tenderness.  Abdominal:     Palpations: Abdomen is soft.     Tenderness: There is no abdominal tenderness.  Musculoskeletal:        General: No swelling. Normal range of motion.     Cervical back: Normal range of motion and neck supple.     Right lower leg: No edema.     Left lower leg: No edema.  Skin:    General: Skin is warm and dry.     Capillary Refill: Capillary refill  takes less than 2 seconds.     Coloration: Skin is not cyanotic or pale.  Neurological:     General: No focal deficit present.     Mental Status: He is alert and oriented to person, place, and time.  Psychiatric:        Mood and Affect: Mood normal.        Behavior: Behavior normal.     ED Results / Procedures / Treatments   Labs (all labs ordered are listed, but only abnormal results are displayed) Labs Reviewed  CBC WITH DIFFERENTIAL/PLATELET - Abnormal; Notable for the following components:      Result Value   Abs Immature Granulocytes 0.11 (*)    All other components within normal limits  COMPREHENSIVE METABOLIC PANEL - Abnormal; Notable for the following components:   CO2 21 (*)    Glucose, Bld 152 (*)    All other components within normal limits  MAGNESIUM  TROPONIN I (HIGH SENSITIVITY)  TROPONIN I (HIGH SENSITIVITY)    EKG EKG Interpretation  Date/Time:  Monday July 18 2022 08:56:26 EDT Ventricular Rate:  103 PR Interval:  183 QRS Duration: 92 QT Interval:  343 QTC Calculation: 449 R Axis:   -8 Text Interpretation: Sinus tachycardia Confirmed by Godfrey Pick ([id number]) on 07/18/2022 9:07:14 AM  Radiology DG Chest Portable 1 View  Result Date: 07/18/2022 CLINICAL DATA:  Chest pain and lightheadedness EXAM: PORTABLE CHEST 1 VIEW COMPARISON:  Radiographs 04/10/2018 FINDINGS: No focal consolidation, pleural effusion, or pneumothorax. Normal cardiomediastinal silhouette. IMPRESSION: No acute cardiopulmonary process. Electronically Signed   By: Placido Sou M.D.   On: 07/18/2022 09:19    Procedures Procedures    Medications Ordered in ED Medications  nitroGLYCERIN (NITROGLYN) 2 % ointment 1 inch (1 inch Topical Given 07/18/22 0928)  acetaminophen (TYLENOL) tablet 650 mg (650 mg Oral Given 07/18/22 1131)  chlordiazePOXIDE (LIBRIUM) capsule 50 mg (50 mg Oral Given 07/18/22 1247)  LORazepam (ATIVAN) tablet 1 mg (1 mg Oral Given 07/18/22 1247)  lactated ringers bolus 1,000  mL (0 mLs Intravenous Stopped 07/18/22 1617)  thiamine (B-1) injection 100 mg (100 mg Intravenous Given 07/18/22 1358)    ED Course/ Medical Decision Making/ A&P                           Medical Decision Making Amount and/or Complexity of Data Reviewed Labs: ordered. Radiology: ordered.  Risk OTC drugs. Prescription drug management.   This patient presents to the ED for concern of chest pain, this involves an extensive number of treatment options, and is a complaint  that carries with it a high risk of complications and morbidity.  The differential diagnosis includes ACS, panic attack, alcohol withdrawal, GERD   Co morbidities that complicate the patient evaluation  anemia, hemochromatosis, GERD, HTN, HLD, depression, anxiety   Additional history obtained:  Additional history obtained from patient's supervisor External records from outside source obtained and reviewed including EMR   Lab Tests:  I Ordered, and personally interpreted labs.  The pertinent results include: Normal hemoglobin, no leukocytosis, normal electrolytes, normal troponins x2   Imaging Studies ordered:  I ordered imaging studies including chest x-ray I independently visualized and interpreted imaging which showed no acute findings I agree with the radiologist interpretation   Cardiac Monitoring: / EKG:  The patient was maintained on a cardiac monitor.  I personally viewed and interpreted the cardiac monitored which showed an underlying rhythm of: Sinus rhythm   Problem List / ED Course / Critical interventions / Medication management  Patient is a 56 year old male who presents for cute onset of chest pain, diaphoresis, nausea, and lightheadedness.  This occurred shortly after he arrived at work today.  Patient received aspirin, NTG, and Zofran prior to arrival.  He reports some improvement in his chest tightness.  He is well-appearing on exam.  There is no evidence of current diaphoresis and he  currently denies any nausea.  Vital signs are notable for hypertension.  NTG ointment was given.  Laboratory work-up was initiated.  On EKG, there is no evidence of ST segment abnormalities.  Laboratory work-up is also reassuring with normal troponins x2.  While in the ED, patient has resolution of all symptoms.  His vital signs, however, were notable for persistent hypertension and mild tachycardia.  On further discussion with the patient, it does appear that he does drink a large amount of alcohol daily.  His last alcoholic drink was 2:02 PM yesterday.  I suspect that his current vital signs as well as his episode of chest pain, lightheadedness, diaphoresis, are secondary to alcohol withdrawal.  Dose of Librium and Ativan were given in the ED.  He was given IV fluids.  I had a discussion with him regarding his desire to stop drinking.  Patient does state that he would like to stop drinking altogether.  He is agreeable to Librium taper.  This was prescribed.  He was advised to discontinue use of this medication if he does resume drinking.  Patient also would benefit from cardiology follow-up.  Cardiology referral was ordered.  Patient was discharged in stable condition. I ordered medication including NTG for chest pain; Tylenol for headache; Librium and Ativan for alcohol withdrawal; IV fluids for chronic alcohol use and tachycardia Reevaluation of the patient after these medicines showed that the patient improved I have reviewed the patients home medicines and have made adjustments as needed   Social Determinants of Health:  Alcohol abuse        Final Clinical Impression(s) / ED Diagnoses Final diagnoses:  Chest pain, unspecified type    Rx / DC Orders ED Discharge Orders          Ordered    chlordiazePOXIDE (LIBRIUM) 25 MG capsule        07/18/22 1554    Ambulatory referral to Cardiology       Comments: If you have not heard from the Cardiology office within the next 72 hours please  call [phone].   07/18/22 1556              Godfrey Pick, MD 07/18/22  1625  

## 2022-07-19 ENCOUNTER — Encounter: Payer: Self-pay | Admitting: Physical Medicine & Rehabilitation

## 2022-07-19 ENCOUNTER — Encounter: Payer: BC Managed Care – PPO | Admitting: Rehabilitative and Restorative Service Providers"

## 2022-07-20 ENCOUNTER — Ambulatory Visit: Payer: BC Managed Care – PPO | Admitting: Family Medicine

## 2022-07-23 ENCOUNTER — Encounter: Payer: Self-pay | Admitting: Family Medicine

## 2022-07-25 ENCOUNTER — Inpatient Hospital Stay: Payer: BC Managed Care – PPO | Attending: Hematology | Admitting: Hematology

## 2022-07-25 ENCOUNTER — Other Ambulatory Visit: Payer: Self-pay

## 2022-07-25 VITALS — BP 145/88 | HR 82 | Temp 97.9°F | Resp 17 | Ht 71.0 in | Wt 224.8 lb

## 2022-07-25 DIAGNOSIS — Z79899 Other long term (current) drug therapy: Secondary | ICD-10-CM | POA: Diagnosis not present

## 2022-07-25 DIAGNOSIS — R079 Chest pain, unspecified: Secondary | ICD-10-CM | POA: Diagnosis not present

## 2022-07-25 DIAGNOSIS — R7989 Other specified abnormal findings of blood chemistry: Secondary | ICD-10-CM | POA: Insufficient documentation

## 2022-07-26 ENCOUNTER — Encounter: Payer: Self-pay | Admitting: Rehabilitative and Restorative Service Providers"

## 2022-07-26 ENCOUNTER — Ambulatory Visit (INDEPENDENT_AMBULATORY_CARE_PROVIDER_SITE_OTHER): Payer: BC Managed Care – PPO | Admitting: Rehabilitative and Restorative Service Providers"

## 2022-07-26 DIAGNOSIS — M5459 Other low back pain: Secondary | ICD-10-CM

## 2022-07-26 DIAGNOSIS — M25552 Pain in left hip: Secondary | ICD-10-CM | POA: Diagnosis not present

## 2022-07-26 DIAGNOSIS — M25652 Stiffness of left hip, not elsewhere classified: Secondary | ICD-10-CM | POA: Diagnosis not present

## 2022-07-26 DIAGNOSIS — M6281 Muscle weakness (generalized): Secondary | ICD-10-CM

## 2022-07-26 DIAGNOSIS — M25551 Pain in right hip: Secondary | ICD-10-CM | POA: Diagnosis not present

## 2022-07-26 DIAGNOSIS — M25651 Stiffness of right hip, not elsewhere classified: Secondary | ICD-10-CM

## 2022-07-26 DIAGNOSIS — R262 Difficulty in walking, not elsewhere classified: Secondary | ICD-10-CM

## 2022-07-26 NOTE — Therapy (Signed)
OUTPATIENT PHYSICAL THERAPY TREATMENT NOTE / PROGRESS NOTE   Patient Name: Blake Roberts MRN: [id number] DOB:1966/11/05, 56 y.o., male Today's Date: 07/26/2022  PCP: Wendie Agreste MD   REFERRING PROVIDER: Jennye Boroughs, MD  Progress Note Reporting Period 07/01/2022 to 07/26/2022  See note below for Objective Data and Assessment of Progress/Goals.      END OF SESSION:   PT End of Session - 07/26/22 0758     Visit Number 3    Number of Visits 20    Date for PT Re-Evaluation 09/09/22    Authorization Type [employer] $70 copay    PT Start Time 0759    PT Stop Time 0839    PT Time Calculation (min) 40 min    Activity Tolerance Patient tolerated treatment well    Behavior During Therapy WFL for tasks assessed/performed              Past Medical History:  Diagnosis Date   Acute meniscal tear of knee RIGHT KNEE   Anxiety    Arthritis RIGHT KNEE   Bulging lumbar disc L5 - S1   Depression    Hemochromatosis    Hyperlipidemia    Hypertension    Past Surgical History:  Procedure Laterality Date   KNEE ARTHROSCOPY  AGE 56 (APPROX)   RIGHT KNEE   KNEE ARTHROSCOPY  07/13/2012   Procedure: ARTHROSCOPY KNEE;  Surgeon: Magnus Sinning, MD;  Location: [facility] [place];  Service: Orthopedics;  Laterality: Right;  WITH PARTIAL MEDIAL MENISECTOMY AND REMOVAL OF LOOSE BODIES   torn rotator cuff     Patient Active Problem List   Diagnosis Date Noted   Hyperlipidemia 05/10/2017   Erectile dysfunction 05/10/2017   Memory difficulty 09/26/2016   Hemochromatosis 07/11/2016   Episodic lightheadedness 12/25/2015   Anemia, unspecified 12/25/2015   Chronic left-sided low back pain with sciatica 07/15/2015   Essential hypertension 02/15/2015   Rotator cuff tear 06/09/2014   GERD (gastroesophageal reflux disease) 05/13/2014    REFERRING DIAG: M16.10 (ICD-10-CM) - Hip arthritis  ONSET DATE: 06/25/2021 (over a year)  THERAPY DIAG:  Other low back pain  Pain in left  hip  Pain in right hip  Stiffness of left hip, not elsewhere classified  Stiffness of right hip, not elsewhere classified  Muscle weakness (generalized)  Difficulty in walking, not elsewhere classified  Rationale for Evaluation and Treatment Rehabilitation  PERTINENT HISTORY: Depression, hyperlipidemia, HTN.  Has high ferritin level (checking into with family MD/oncology.  Pt also indicated he has Rt knee pain history  PRECAUTIONS: None  SUBJECTIVE:  Pt indicated having chest pain in time since last visit and sought medical care for it.  Pt indicated it was cramp like in chest.  Will follow up with cardiologist.      Pt indicated he is getting more flexible in general mobility during life.  Pt indicated pain has been better during the day until late afternoon and evening.  Starts as ache.  Pt indicated he has been trying to stay consistent with exercise.  Overall improvement 30-40 % to normal.   PAIN:  NPRS scale: At worst 3/10 Pain location: back, hips (Rt) Pain description: dull, achy Aggravating factors:  end of day, prolonged standing/walking.  Relieving factors: OTC medicine   PATIENT GOALS  Reduce pain, get back into working out (elliptical, general UE/LE strengthening), standing/walking ability improved  OBJECTIVE:    DIAGNOSTIC FINDINGS: 07/01/2022 review:  MR spine 11/15/19   IMPRESSION: 1. Lumbar dextroscoliosis with moderately advanced  multilevel disc and facet degeneration. 2. Moderate spinal stenosis at L3-4 and mild spinal stenosis at L2-3 and L4-5. 3. Left-sided synovial cyst at L3-4 with left lateral recess stenosis and potential left L4 nerve root impingement. 4. Moderate multilevel neural foraminal stenosis.   PATIENT SURVEYS:  07/26/2022 :  FOTO update: 59  07/01/2022 FOTO intake:   47   predicted:   63   COGNITION: 07/01/2022 Overall cognitive status: Within functional limits for tasks assessed                                 MUSCLE LENGTH: 07/26/2022:   (+) bilateral thomas test for iliopsoas and rectus tightness  07/01/2022 (+) thomas test bilateral   POSTURE:  07/01/2022  Forward trunk posture,    Back/ LOWER EXTREMITY ROM:                         07/01/2022: no centralization/peripherialization.  End range overpressure for hip revealed simliar movement amount.  Joint mobility hypomobile in lumbar and hip in all directions.  Active ROM   Right 07/01/2022 Left 07/01/2022 Right 07/12/2022 Left 07/12/2022 Right 07/26/2022 Left 07/26/2022  Hip flexion 115, measured in supine  115 measured in supine      Hip extension          Hip abduction          Hip adduction          Hip internal rotation 0 measured in supine 90 deg flexion 10 measured in supine 90 deg flexion 12 measured in supine 90 deg flexion 12 measured in supine 90 deg flexion 15 measured in supine 90 deg flexion 15 measured in supine 90 deg flexion  Hip external rotation 55 measured in supine 90 deg flexion 55 measured in supine 90 deg flexion   60 measured in supine 90 deg flexion 60 measured in supine 90 deg flexion  Knee flexion          Knee extension                     Lumbar flexion To mid shin        Lumbar extension To neutral c pain lumbar/Rt hip buttock        25 % WFL   25 % WFL c pulling in hip/quads              (Blank rows = not tested)   LOWER EXTREMITY MMT:   MMT Right 07/01/2022 Left 07/01/2022 Right 07/26/2022 Left 07/26/2022  Hip flexion 5/5 5/5 5/5 5/5  Hip extension 4/5 4/5 4/5 5/5  Hip abduction        Hip adduction        Hip internal rotation        Hip external rotation        Knee flexion 5/5 5/5    Knee extension 5/5 5/5    Ankle dorsiflexion 5/5 5/5    Ankle plantarflexion        Ankle inversion        Ankle eversion         (Blank rows = not tested)   LOWER EXTREMITY SPECIAL TESTS:  07/01/2022(-) slump bilateral.  (+) FABER bilateral hip    FUNCTIONAL TESTS:  07/01/2022 18 inch chair transfer s UE assist on 1st try   GAIT: 07/01/2022   Independent ambulation c forward trunk  lean, no hip extension bilateral in gait cycle, wider base of support.        TODAY'S TREATMENT: 07/26/2022  Therex:    Supine bridge 2-3 sec hold 2 x 10    Supine figure 4 push away and pull towards 3 x 15 seconds bilateral   Supine thomas test stretching 15 sec x 5 bilateral (cues for home use)   Standing lumbar extension x10    Review of HEP c cues given verbally.  Handout provided c additional exercise for home use.  Cues given on shorter stretching routine prior to activity (gym for instance) and longer stretching otherwise.    Manual:   Prone g3 cPA lumbar L2-L5  07/12/2022  Therex:    Nustep lvl 6 7 mins UE/LE   Supine bridge x 10    Supine figure 4 push away and pull towards 3 x 15 seconds bilateral   Sidelying clam shell x 10 bilateral (cues for home use)   Sidelying reverse clam shell x 10 bilateral ( cues for home use)   Manual:   G3-G4 inferior jt mobs, lateral joint mobs/distraction, PA mobs through shaft of femur, performed bilateral for mobility gains.   07/01/2022 Therex:            HEP instruction/performance c cues for techniques, handout provided.  Trial set performed of each for comprehension and symptom assessment.  See below for exercise list.     PATIENT EDUCATION:  07/26/2022 Education details: HEP progression  Person educated: Patient Education method: Consulting civil engineer, Media planner, Verbal cues, and Handouts Education comprehension: verbalized understanding, returned demonstration, and verbal cues required     HOME EXERCISE PROGRAM: Access Code: [email or web address] URL: [email or web address] Date: 07/26/2022 Prepared by: Scot Jun  Exercises - Supine Lower Trunk Rotation  - 2-3 x daily - 7 x weekly - 1 sets - 3-5 reps - 15 hold - Supine Piriformis Stretch with Foot on Ground  - 2-3 x daily - 7 x weekly - 1 sets - 3-5 reps - 30 hold - Supine Figure 4 Piriformis Stretch  - 2-3 x daily - 7 x weekly - 1 sets  - 3-5 reps - 30 hold - Supine Bridge  - 1-2 x daily - 7 x weekly - 1-2 sets - 10-15 reps - 2 hold - Standing Lumbar Extension with Counter  - 3-5 x daily - 7 x weekly - 1 sets - 5-10 reps - Standing Scapular Retraction  - 3-5 x daily - 7 x weekly - 1 sets - 5-10 reps - Clamshell  - 1-2 x daily - 7 x weekly - 3 sets - 10-15 reps - Sidelying Reverse Clamshell  - 1-2 x daily - 7 x weekly - 3 sets - 10-15 reps - Hip Flexor Stretch at Edge of Bed  - 1-2 x daily - 7 x weekly - 1 sets - 5 reps - 15 hold   ASSESSMENT:   CLINICAL IMPRESSION: Pt has attended 3 visits over the course of approx. 1 month, reporting 30-40% improvement, reduced severity and irritability of symptoms compared to evaluation presentation.  See objective data for updated information.  Mild improvements noted in mobility deficits in lumbar/hip at this time.  Continued presentation of back/hip pain c mobility deficits primary that can impair functional activity of daily life that still indicates necessity for skilled PT services at this time.    OBJECTIVE IMPAIRMENTS Abnormal gait, decreased activity tolerance, decreased endurance, decreased mobility, difficulty walking, decreased ROM, decreased strength, hypomobility, increased fascial restrictions, impaired perceived  functional ability, impaired flexibility, improper body mechanics, postural dysfunction, and pain.    ACTIVITY LIMITATIONS carrying, lifting, bending, standing, squatting, sleeping, stairs, transfers, and locomotion level   PARTICIPATION LIMITATIONS: cleaning, laundry, interpersonal relationship, driving, shopping, community activity, occupation, and exercise   PERSONAL FACTORS  Depression, hyperlipidemia, HTN  are also affecting patient's functional outcome.    REHAB POTENTIAL: Good   CLINICAL DECISION MAKING: Stable/uncomplicated   EVALUATION COMPLEXITY: Low     GOALS: Goals reviewed with patient? Yes   Short term PT Goals (target date for Short term goals  are 3 weeks 07/22/2022) Patient will demonstrate independent use of home exercise program to maintain progress from in clinic treatments. Goal status: MET    Long term PT goals (target dates for all long term goals are 10 weeks  09/09/2022 )   1. Patient will demonstrate/report pain at worst less than or equal to 2/10 to facilitate minimal limitation in daily activity secondary to pain symptoms. Goal status: on going - assessed 07/26/2022   2. Patient will demonstrate independent use of home exercise program to facilitate ability to maintain/progress functional gains from skilled physical therapy services. Goal status: on going - assessed 07/26/2022   3. Patient will demonstrate FOTO outcome > or = 63 % to indicate reduced disability due to condition. Goal status: on going - assessed 07/26/2022   4.  Patient will demonstrate improvement in hip AROM bilateral > 15 degrees each direction to facilitate progress in hip mobility for functional standing/walking.  Goal status: on going - assessed 07/26/2022   5.  Patient will demonstrate lumbar extension AROM > or = 50% to facilitate ability to perform standing/walking s restriction.    Goal status: on going - assessed 07/26/2022   6.  Patient will demonstrate/report ability to perform workout activity s restriction due to symptoms.   Goal status: on going - assessed 07/26/2022         PLAN: PT FREQUENCY: 1-2x/week   PT DURATION: 10 weeks   PLANNED INTERVENTIONS: Therapeutic exercises, Therapeutic activity, Neuro Muscular re-education, Balance training, Gait training, Patient/Family education, Joint mobilization, Stair training, DME instructions, Dry Needling, Electrical stimulation, Cryotherapy, Moist heat, Taping, Ultrasound, Ionotophoresis 41m/ml Dexamethasone, and Manual therapy.  All included unless contraindicated   PLAN FOR NEXT SESSION: Lumbar and hip joint mobilization, specifically hip IR/extension for ambulation/standing.   MScot Jun  PT, DPT, OCS, ATC 07/26/22  8:38 AM

## 2022-08-01 ENCOUNTER — Encounter: Payer: Self-pay | Admitting: Hematology

## 2022-08-01 NOTE — Addendum Note (Signed)
Addended by: Sullivan Lone on: 08/01/2022 01:31 AM   Modules accepted: Level of Service

## 2022-08-01 NOTE — Progress Notes (Addendum)
..    HEMATOLOGY/ONCOLOGY CLINIC NOTE  Date of Service: .07/25/2022  Patient Care Team: Wendie Agreste, MD as PCP - General (Family Medicine) Robyn Haber, MD as Consulting Physician (Family Medicine)  CHIEF COMPLAINTS/PURPOSE OF CONSULTATION:  Elevated ferritin and fatigue  HISTORY OF PRESENTING ILLNESS:   Blake Roberts is a wonderful 56 y.o. male who has been referred to [place] by Dr Carlota Raspberry, Ranell Patrick, MD /Dr Adrian Prows for evaluation and management of elevated ferritin.  Patient has a history of hypertension, dyslipidemia, anxiety, arthritis, depression who has been following with Dr.Ganji at [facility] for evaluation of dizziness and near-syncope as well as nausea vomiting and epigastric pain over the last 6 months. He has also apparently had significant dyspnea on exertion and was concerned since his brother had a pacemaker placed at age 1 years. He was noted to have uncontrolled hypertension and in addition to amlodipine that he was on was also started on valsartan/hydrochlorothiazide. He also had an echo and an event monitor evaluation which did not show any cardiomyopathy or arrhythmias. He subsequently had a treadmill stress test on 02/17/2016 that showed normal exercise tolerance and no evidence of overt ischemia. No arrhythmias.  During the course of his workup for fatigue and dyspnea on exertion he was noted to have an elevated ferritin level of 769 on 12/30/2015. This has remained elevated in the 500-700 range and he was referred to [place] for evaluation and management of possible hemochromatosis.  Patient notes family history of unexplained heart disease in his brother at age 108. No family history of liver cirrhosis no known family history of iron overload or hemochromatosis.  He notes the fatigue has been more bothersome since early part of this year and he is had some joint pains as well. Denies excessive alcohol use. Denies taking iron supplementation  over-the-counter or using cast iron skin metastases for cooking.  INTERVAL HISTORY  Mr Norgaard has been referred back to [place] for continued evaluation and management of elevated ferritin levels.  He was seen back in 2017 and at that time his HFE gene mutation testing showed no overt evidence of hereditary hemochromatosis. His iron overload was noted to be due to excessive alcohol use. He was recommended to avoid iron supplementation, no cooking in cast iron skillet's, alcohol cessation and was set up for trial of therapeutic phlebotomies to determine how quickly his ferritin levels came down. Patient chose not to follow-up for his therapeutic phlebotomies. His ferritin levels at the time were about 700. His recent ferritin levels on 06/20/2022 were down to 492 without any specific interventions. We discussed and he is agreeable to cutting down alcohol use. He was also on a multivitamin that might have had some iron and he was recommended to stop this. Recently done labs were discussed with him in details. He notes that he has had some increased fatigue recently and recently has also been having some retrosternal chest pain on and off. He was in the emergency room on 07/18/2022 for evaluation of this chest pain and notes that he has follow-up with cardiology to evaluate this further.  MEDICAL HISTORY:  Past Medical History:  Diagnosis Date   Acute meniscal tear of knee RIGHT KNEE   Anxiety    Arthritis RIGHT KNEE   Bulging lumbar disc L5 - S1   Depression    Hemochromatosis    Hyperlipidemia    Hypertension     SURGICAL HISTORY: Past Surgical History:  Procedure Laterality Date  KNEE ARTHROSCOPY  AGE 67 (APPROX)   RIGHT KNEE   KNEE ARTHROSCOPY  07/13/2012   Procedure: ARTHROSCOPY KNEE;  Surgeon: Magnus Sinning, MD;  Location: [place];  Service: Orthopedics;  Laterality: Right;  WITH PARTIAL MEDIAL MENISECTOMY AND REMOVAL OF LOOSE BODIES   torn rotator cuff       SOCIAL HISTORY: Social History   Socioeconomic History   Marital status: Legally Separated    Spouse name: Not on file   Number of children: 0   Years of education: BA   Highest education level: Not on file  Occupational History   Occupation: paramedic  Tobacco Use   Smoking status: Never   Smokeless tobacco: Never  Substance and Sexual Activity   Alcohol use: Yes    Comment: 4 beers a month,states heavy beer drinker in younger years   Drug use: No   Sexual activity: Yes  Other Topics Concern   Not on file  Social History Narrative   Lives at home alone   Separated, 2 step-daughters   Right-handed   Caffeine: 2 diet Cokes per day   Social Determinants of Health   Financial Resource Strain: Not on file  Food Insecurity: Not on file  Transportation Needs: Not on file  Physical Activity: Not on file  Stress: Not on file  Social Connections: Not on file  Intimate Partner Violence: Not on file    FAMILY HISTORY: Family History  Problem Relation Age of Onset   Breast cancer Mother    Lung cancer Mother    Diabetes Father        adopted father; no history of biological father   Heart disease Father    Hyperlipidemia Father    Hypertension Father    Kidney disease Father    Heart Problems Brother        has pacemaker also handicapped   Pancreatic cancer Maternal Uncle        age early 45's   Colon cancer Neg Hx     Maternal grandmother had breast cancer at age 61 years Mother had lung cancer she was a smoker Maternal grandfather lung cancer Maternal aunt throat cancer Reports that he does not know much regarding his father's side of the family.   ALLERGIES:  is allergic to ace inhibitors.  MEDICATIONS:  Current Outpatient Medications  Medication Sig Dispense Refill   amLODipine (NORVASC) 10 MG tablet TAKE 1 TABLET(10 MG) BY MOUTH DAILY 90 tablet 1   atorvastatin (LIPITOR) 20 MG tablet TAKE 1 TABLET(20 MG) BY MOUTH DAILY 90 tablet 1    hydrochlorothiazide (HYDRODIURIL) 25 MG tablet Take 1 tablet (25 mg total) by mouth daily. 90 tablet 1   naproxen sodium (ANAPROX) 550 MG tablet      tadalafil (CIALIS) 20 MG tablet Take 0.5 tablets (10 mg total) by mouth daily as needed for erectile dysfunction. 6 tablet 3   chlordiazePOXIDE (LIBRIUM) 25 MG capsule '50mg'$  PO TID x 1D, then 25-'50mg'$  PO BID X 1D, then 25-'50mg'$  PO QD X 1D (Patient not taking: Reported on 07/25/2022) 10 capsule 0   DULoxetine (CYMBALTA) 30 MG capsule Take 1 capsule (30 mg total) by mouth daily. (Patient not taking: Reported on 07/25/2022) 30 capsule 3   predniSONE (DELTASONE) 50 MG tablet Take one tablet daily for 5 days. (Patient not taking: Reported on 07/25/2022) 5 tablet 0   tiZANidine (ZANAFLEX) 4 MG tablet Take 1 tablet (4 mg total) by mouth every 8 (eight) hours as needed for muscle  spasms. 30 tablet 0   Current Facility-Administered Medications  Medication Dose Route Frequency Provider Last Rate Last Admin   dexamethasone (DECADRON) injection 15 mg  15 mg Other Once Magnus Sinning, MD       diclofenac Sodium (VOLTAREN) 1 % topical gel 4 g  4 g Topical QID Jennye Boroughs, MD        REVIEW OF SYSTEMS:    10 Point review of Systems was done is negative except as noted above.  PHYSICAL EXAMINATION: ECOG PERFORMANCE STATUS: 1 - Symptomatic but completely ambulatory  . Vitals:   07/25/22 1111  BP: (!) 145/88  Pulse: 82  Resp: 17  Temp: 97.9 F (36.6 C)  SpO2: 97%   Filed Weights   07/25/22 1111  Weight: 224 lb 12.8 oz (102 kg)   .Body mass index is 31.35 kg/m. NAD GENERAL:alert, in no acute distress and comfortable SKIN: no acute rashes, no significant lesions EYES: conjunctiva are pink and non-injected, sclera anicteric OROPHARYNX: MMM, no exudates, no oropharyngeal erythema or ulceration NECK: supple, no JVD LYMPH:  no palpable lymphadenopathy in the cervical, axillary or inguinal regions LUNGS: clear to auscultation b/l with normal  respiratory effort HEART: regular rate & rhythm ABDOMEN:  normoactive bowel sounds , non tender, not distended. Extremity: no pedal edema PSYCH: alert & oriented x 3 with fluent speech NEURO: no focal motor/sensory deficits   LABORATORY DATA:   .    Latest Ref Rng & Units 07/18/2022    8:57 AM 06/20/2022    2:38 PM 06/30/2021    3:09 PM  CBC  WBC 4.0 - 10.5 K/uL 7.5  9.9  10.4   Hemoglobin 13.0 - 17.0 g/dL 14.3  14.3  15.3   Hematocrit 39.0 - 52.0 % 42.2  41.1  44.6   Platelets 150 - 400 K/uL 287  288.0  231.0        Latest Ref Rng & Units 07/18/2022    8:57 AM 06/20/2022    2:38 PM 01/10/2022    4:33 PM  CMP  Glucose 70 - 99 mg/dL 152  105  110   BUN 6 - 20 mg/dL '14  15  18   '$ Creatinine 0.61 - 1.24 mg/dL 0.98  1.12  1.08   Sodium 135 - 145 mmol/L 139  137  138   Potassium 3.5 - 5.1 mmol/L 3.8  3.6  4.0   Chloride 98 - 111 mmol/L 103  98  102   CO2 22 - 32 mmol/L '21  26  23   '$ Calcium 8.9 - 10.3 mg/dL 8.9  9.6  9.6   Total Protein 6.5 - 8.1 g/dL 7.5  7.7  7.8   Total Bilirubin 0.3 - 1.2 mg/dL 0.5  0.6  0.5   Alkaline Phos 38 - 126 U/L 48  57  51   AST 15 - 41 U/L '28  26  20   '$ ALT 0 - 44 U/L 31  37  22     . Lab Results  Component Value Date   IRON 116 08/01/2016   TIBC 343 08/01/2016   IRONPCTSAT 34 08/01/2016   (Iron and TIBC)  Lab Results  Component Value Date   FERRITIN 492.2 (H) 06/20/2022     Hemochromatosis DNA, PCR  Order: [id number]  Status:  Final result   Visible to patient:  No (Not Released) Next appt:  09/26/2016 at 09:30 AM in Neurology Lenor Coffin, MD) Dx:  Elevated ferritin   13moago  Hemochromatosis Gene Comment  Comments: NO MUTATION IDENTIFIED  Interpretation:  This patient's sample was analyzed for the hereditary  hemochromatosis (HH) mutations C282Y, H63D, S65C. No  mutation was identified. The mutations analyzed by [facility]  are most common in the Caucasian population, and up to 90%  of affected Caucasians will have a  positive test result.  Because this panel does not identify rare HH mutations or  HH mutations found in other ethnic groups, there are a  small number of people who may have a negative test but may  actually be affected. The diagnosis of HH should include  clinical findings and other test results such as  transferrin-iron saturation and/or serum ferritin studies  and/or liver biopsy.  If this patient has a history of HH,  in many cases a specific carrier risk can be determined  based on this negative result.  Methodology:  DNA Analysis of the HFE gene was performed by PCR  amplification followed by restriction enzyme digestion  analyses.         RADIOGRAPHIC STUDIES: I have personally reviewed the radiological images as listed and agreed with the findings in the report. DG Chest Portable 1 View  Result Date: 07/18/2022 CLINICAL DATA:  Chest pain and lightheadedness EXAM: PORTABLE CHEST 1 VIEW COMPARISON:  Radiographs 04/10/2018 FINDINGS: No focal consolidation, pleural effusion, or pneumothorax. Normal cardiomediastinal silhouette. IMPRESSION: No acute cardiopulmonary process. Electronically Signed   By: Placido Sou M.D.   On: 07/18/2022 09:19    ASSESSMENT & PLAN:   56 year old Caucasian male with  1) Elevated Ferritin levels. Patient reports some unexplained fatigue and joint pains. His brother needed a pacemaker at age 73yrno clear etiology. He has had some episodes of dizziness that have been worked up by his cardiologist Dr. GEinar Gipand and he has had a negative cardiac workup thus far . No known history of liver disease or elevated liver function tests at this time . No family history of liver cirrhosis , overt h/o sudden cardiac death or hemochromatosis . HFE genetic panel negative. PLAN -Patient does not appear to have hereditary hemochromatosis as per his previous molecular testing results as noted above. -His ferritin levels have actually come down to 493 from a  previous high of nearly 700 without any specific interventions. -He is currently having chest pain that needs to be evaluated by cardiology and we will avoid doing therapeutic phlebotomies at this time in that context. -He has been recommended absolute alcohol cessation since this is the likely cause of his iron overload along with transaminitis from alcohol use. -Avoid hepatotoxic medications -He was recommended avoiding any iron supplementation and not cooking in cast iron skillet's. -Once his chest pain symptoms have resolved and he is cleared by his cardiologist and primary care physician he could choose to donate blood couple of times a year to try to control his iron overload. -His testosterone levels are also noted to be low and once his cardiac issues are ruled out if he is started on testosterone he might develop some element of polycythemia related to this which also might need to be addressed potentially with blood donations if [facility] will take his blood.   We shall see him back as needed  The total time spent in the appointment was 32 minutes*.  All of the patient's questions were answered with apparent satisfaction. The patient knows to call the clinic with any problems, questions or concerns.   GSullivan LoneMD MWinstonAAHIVMS SPlum Creek Specialty HospitalCSalem Laser And Surgery CenterHematology/Oncology Physician CFort [facility]  [facility]  .*Total Encounter Time as defined by the Centers for Medicare and Medicaid Services includes, in addition to the face-to-face time of a patient visit (documented in the note above) non-face-to-face time: obtaining and reviewing outside history, ordering and reviewing medications, tests or procedures, care coordination (communications with other health care professionals or caregivers) and documentation in the medical record.

## 2022-08-02 ENCOUNTER — Encounter: Payer: Self-pay | Admitting: Rehabilitative and Restorative Service Providers"

## 2022-08-02 ENCOUNTER — Ambulatory Visit: Payer: BC Managed Care – PPO | Admitting: Rehabilitative and Restorative Service Providers"

## 2022-08-02 DIAGNOSIS — M25551 Pain in right hip: Secondary | ICD-10-CM | POA: Diagnosis not present

## 2022-08-02 DIAGNOSIS — R262 Difficulty in walking, not elsewhere classified: Secondary | ICD-10-CM

## 2022-08-02 DIAGNOSIS — M5459 Other low back pain: Secondary | ICD-10-CM

## 2022-08-02 DIAGNOSIS — M25651 Stiffness of right hip, not elsewhere classified: Secondary | ICD-10-CM

## 2022-08-02 DIAGNOSIS — M25552 Pain in left hip: Secondary | ICD-10-CM | POA: Diagnosis not present

## 2022-08-02 DIAGNOSIS — M6281 Muscle weakness (generalized): Secondary | ICD-10-CM

## 2022-08-02 DIAGNOSIS — M25652 Stiffness of left hip, not elsewhere classified: Secondary | ICD-10-CM | POA: Diagnosis not present

## 2022-08-02 NOTE — Therapy (Signed)
OUTPATIENT PHYSICAL THERAPY TREATMENT NOTE   Patient Name: Blake Roberts MRN: [id number] DOB:Dec 09, 1966, 55 y.o., male Today's Date: 08/02/2022  PCP: Wendie Agreste MD   REFERRING PROVIDER: Jennye Boroughs, MD      END OF SESSION:   PT End of Session - 08/02/22 0805     Visit Number 4    Number of Visits 20    Date for PT Re-Evaluation 09/09/22    Authorization Type [employer] $70 copay    PT Start Time 0801    PT Stop Time 0840    PT Time Calculation (min) 39 min    Activity Tolerance Patient tolerated treatment well    Behavior During Therapy WFL for tasks assessed/performed               Past Medical History:  Diagnosis Date   Acute meniscal tear of knee RIGHT KNEE   Anxiety    Arthritis RIGHT KNEE   Bulging lumbar disc L5 - S1   Depression    Hemochromatosis    Hyperlipidemia    Hypertension    Past Surgical History:  Procedure Laterality Date   KNEE ARTHROSCOPY  AGE 44 (APPROX)   RIGHT KNEE   KNEE ARTHROSCOPY  07/13/2012   Procedure: ARTHROSCOPY KNEE;  Surgeon: Magnus Sinning, MD;  Location: [place];  Service: Orthopedics;  Laterality: Right;  WITH PARTIAL MEDIAL MENISECTOMY AND REMOVAL OF LOOSE BODIES   torn rotator cuff     Patient Active Problem List   Diagnosis Date Noted   Hyperlipidemia 05/10/2017   Erectile dysfunction 05/10/2017   Memory difficulty 09/26/2016   Hemochromatosis 07/11/2016   Episodic lightheadedness 12/25/2015   Anemia, unspecified 12/25/2015   Chronic left-sided low back pain with sciatica 07/15/2015   Essential hypertension 02/15/2015   Rotator cuff tear 06/09/2014   GERD (gastroesophageal reflux disease) 05/13/2014    REFERRING DIAG: M16.10 (ICD-10-CM) - Hip arthritis  ONSET DATE: 06/25/2021 (over a year)  THERAPY DIAG:  Other low back pain  Pain in left hip  Pain in right hip  Stiffness of left hip, not elsewhere classified  Stiffness of right hip, not elsewhere classified  Muscle  weakness (generalized)  Difficulty in walking, not elsewhere classified  Rationale for Evaluation and Treatment Rehabilitation  PERTINENT HISTORY: Depression, hyperlipidemia, HTN.  Has high ferritin level (checking into with family MD/oncology.  Pt also indicated he has Rt knee pain history  PRECAUTIONS: None  SUBJECTIVE:  Pt indicated less pain complaints overall with taking medicine less.  Pt indicated feeling pains primarily in hips when it occurs.   Pt indicated he isn't sure if hes gaining movement as quickly.   PAIN:  NPRS scale: At worst 3/10 Pain location: back, hips (Rt) Pain description: dull, achy Aggravating factors:  end of day, prolonged standing/walking.  Relieving factors: OTC medicine   PATIENT GOALS  Reduce pain, get back into working out (elliptical, general UE/LE strengthening), standing/walking ability improved  OBJECTIVE:    DIAGNOSTIC FINDINGS: 07/01/2022 review:  MR spine 11/15/19   IMPRESSION: 1. Lumbar dextroscoliosis with moderately advanced multilevel disc and facet degeneration. 2. Moderate spinal stenosis at L3-4 and mild spinal stenosis at L2-3 and L4-5. 3. Left-sided synovial cyst at L3-4 with left lateral recess stenosis and potential left L4 nerve root impingement. 4. Moderate multilevel neural foraminal stenosis.   PATIENT SURVEYS:  07/26/2022 :  FOTO update: 59  07/01/2022 FOTO intake:   47   predicted:   63   COGNITION: 07/01/2022 Overall cognitive  status: Within functional limits for tasks assessed                                 MUSCLE LENGTH: 07/26/2022:  (+) bilateral thomas test for iliopsoas and rectus tightness  07/01/2022 (+) thomas test bilateral   POSTURE:  07/01/2022  Forward trunk posture,    Back/ LOWER EXTREMITY ROM:                         07/01/2022: no centralization/peripherialization.  End range overpressure for hip revealed simliar movement amount.  Joint mobility hypomobile in lumbar and hip in all directions.  Active  ROM   Right 07/01/2022 Left 07/01/2022 Right 07/12/2022 Left 07/12/2022 Right 07/26/2022 Left 07/26/2022 08/02/2022  Hip flexion 115, measured in supine  115 measured in supine       Hip extension           Hip abduction           Hip adduction           Hip internal rotation 0 measured in supine 90 deg flexion 10 measured in supine 90 deg flexion 12 measured in supine 90 deg flexion 12 measured in supine 90 deg flexion 15 measured in supine 90 deg flexion 15 measured in supine 90 deg flexion   Hip external rotation 55 measured in supine 90 deg flexion 55 measured in supine 90 deg flexion   60 measured in supine 90 deg flexion 60 measured in supine 90 deg flexion   Knee flexion           Knee extension                       Lumbar flexion To mid shin         Lumbar extension To neutral c pain lumbar/Rt hip buttock        25 % WFL   25 % WFL c pulling in hip/quads 50% WFL               (Blank rows = not tested)   LOWER EXTREMITY MMT:   MMT Right 07/01/2022 Left 07/01/2022 Right 07/26/2022 Left 07/26/2022  Hip flexion 5/5 5/5 5/5 5/5  Hip extension 4/5 4/5 4/5 5/5  Hip abduction        Hip adduction        Hip internal rotation        Hip external rotation        Knee flexion 5/5 5/5    Knee extension 5/5 5/5    Ankle dorsiflexion 5/5 5/5    Ankle plantarflexion        Ankle inversion        Ankle eversion         (Blank rows = not tested)   LOWER EXTREMITY SPECIAL TESTS:  07/01/2022(-) slump bilateral.  (+) FABER bilateral hip    FUNCTIONAL TESTS:  07/01/2022 18 inch chair transfer s UE assist on 1st try   GAIT: 07/01/2022  Independent ambulation c forward trunk lean, no hip extension bilateral in gait cycle, wider base of support.        TODAY'S TREATMENT: 08/02/2022  Therex:    Prone quad stretch c strap 30 sec x 5 bilateral    Standing lumbar extension x10   Lying flat leg press double leg 100 lbs 2 x 15, single leg x  15    Hip hike c press into doorframe hip abduction  hold 5 sec hold x 10    Supine bridge 5 sec hold x 15     Manual:   Prone g3 cPA lumbar L2-L5  07/26/2022  Therex:    Supine bridge 2-3 sec hold 2 x 10    Supine figure 4 push away and pull towards 3 x 15 seconds bilateral   Supine thomas test stretching 15 sec x 5 bilateral (cues for home use)   Standing lumbar extension x10    Review of HEP c cues given verbally.  Handout provided c additional exercise for home use.  Cues given on shorter stretching routine prior to activity (gym for instance) and longer stretching otherwise.    Manual:   Prone g3 cPA lumbar L2-L5  07/12/2022  Therex:    Nustep lvl 6 7 mins UE/LE   Supine bridge x 10    Supine figure 4 push away and pull towards 3 x 15 seconds bilateral   Sidelying clam shell x 10 bilateral (cues for home use)   Sidelying reverse clam shell x 10 bilateral ( cues for home use)   Manual:   G3-G4 inferior jt mobs, lateral joint mobs/distraction, PA mobs through shaft of femur, performed bilateral for mobility gains.   07/01/2022 Therex:            HEP instruction/performance c cues for techniques, handout provided.  Trial set performed of each for comprehension and symptom assessment.  See below for exercise list.     PATIENT EDUCATION:  07/26/2022 Education details: HEP progression  Person educated: Patient Education method: Consulting civil engineer, Media planner, Verbal cues, and Handouts Education comprehension: verbalized understanding, returned demonstration, and verbal cues required     HOME EXERCISE PROGRAM: Access Code: [email or web address] URL: [email or web address] Date: 07/26/2022 Prepared by: Scot Jun  Exercises - Supine Lower Trunk Rotation  - 2-3 x daily - 7 x weekly - 1 sets - 3-5 reps - 15 hold - Supine Piriformis Stretch with Foot on Ground  - 2-3 x daily - 7 x weekly - 1 sets - 3-5 reps - 30 hold - Supine Figure 4 Piriformis Stretch  - 2-3 x daily - 7 x weekly - 1 sets - 3-5 reps - 30 hold - Supine Bridge  -  1-2 x daily - 7 x weekly - 1-2 sets - 10-15 reps - 2 hold - Standing Lumbar Extension with Counter  - 3-5 x daily - 7 x weekly - 1 sets - 5-10 reps - Standing Scapular Retraction  - 3-5 x daily - 7 x weekly - 1 sets - 5-10 reps - Clamshell  - 1-2 x daily - 7 x weekly - 3 sets - 10-15 reps - Sidelying Reverse Clamshell  - 1-2 x daily - 7 x weekly - 3 sets - 10-15 reps - Hip Flexor Stretch at Edge of Bed  - 1-2 x daily - 7 x weekly - 1 sets - 5 reps - 15 hold   ASSESSMENT:   CLINICAL IMPRESSION: Upright posture and lumbar extension mobility improving to this point.   Hip flexor and quad tightness improving some but still evident in postural limitation for upright standing posture.  Continued emphasis on improved hip mobility indicated.      OBJECTIVE IMPAIRMENTS Abnormal gait, decreased activity tolerance, decreased endurance, decreased mobility, difficulty walking, decreased ROM, decreased strength, hypomobility, increased fascial restrictions, impaired perceived functional ability, impaired flexibility, improper body mechanics, postural dysfunction, and pain.  ACTIVITY LIMITATIONS carrying, lifting, bending, standing, squatting, sleeping, stairs, transfers, and locomotion level   PARTICIPATION LIMITATIONS: cleaning, laundry, interpersonal relationship, driving, shopping, community activity, occupation, and exercise   PERSONAL FACTORS  Depression, hyperlipidemia, HTN  are also affecting patient's functional outcome.    REHAB POTENTIAL: Good   CLINICAL DECISION MAKING: Stable/uncomplicated   EVALUATION COMPLEXITY: Low     GOALS: Goals reviewed with patient? Yes   Short term PT Goals (target date for Short term goals are 3 weeks 07/22/2022) Patient will demonstrate independent use of home exercise program to maintain progress from in clinic treatments. Goal status: MET    Long term PT goals (target dates for all long term goals are 10 weeks  09/09/2022 )   1. Patient will  demonstrate/report pain at worst less than or equal to 2/10 to facilitate minimal limitation in daily activity secondary to pain symptoms. Goal status: on going - assessed 07/26/2022   2. Patient will demonstrate independent use of home exercise program to facilitate ability to maintain/progress functional gains from skilled physical therapy services. Goal status: on going - assessed 07/26/2022   3. Patient will demonstrate FOTO outcome > or = 63 % to indicate reduced disability due to condition. Goal status: on going - assessed 07/26/2022   4.  Patient will demonstrate improvement in hip AROM bilateral > 15 degrees each direction to facilitate progress in hip mobility for functional standing/walking.  Goal status: on going - assessed 07/26/2022   5.  Patient will demonstrate lumbar extension AROM > or = 50% to facilitate ability to perform standing/walking s restriction.    Goal status: on going - assessed 07/26/2022   6.  Patient will demonstrate/report ability to perform workout activity s restriction due to symptoms.   Goal status: on going - assessed 07/26/2022         PLAN: PT FREQUENCY: 1-2x/week   PT DURATION: 10 weeks   PLANNED INTERVENTIONS: Therapeutic exercises, Therapeutic activity, Neuro Muscular re-education, Balance training, Gait training, Patient/Family education, Joint mobilization, Stair training, DME instructions, Dry Needling, Electrical stimulation, Cryotherapy, Moist heat, Taping, Ultrasound, Ionotophoresis 54m/ml Dexamethasone, and Manual therapy.  All included unless contraindicated   PLAN FOR NEXT SESSION: Hip mobitiy gains.   MScot Jun PT, DPT, OCS, ATC 08/02/22  8:40 AM

## 2022-08-03 ENCOUNTER — Encounter: Payer: Self-pay | Admitting: Physical Medicine & Rehabilitation

## 2022-08-04 ENCOUNTER — Ambulatory Visit: Payer: BC Managed Care – PPO | Admitting: Family Medicine

## 2022-08-09 ENCOUNTER — Encounter: Payer: Self-pay | Admitting: Rehabilitative and Restorative Service Providers"

## 2022-08-09 ENCOUNTER — Ambulatory Visit: Payer: BC Managed Care – PPO | Admitting: Rehabilitative and Restorative Service Providers"

## 2022-08-09 DIAGNOSIS — M25551 Pain in right hip: Secondary | ICD-10-CM | POA: Diagnosis not present

## 2022-08-09 DIAGNOSIS — M25652 Stiffness of left hip, not elsewhere classified: Secondary | ICD-10-CM | POA: Diagnosis not present

## 2022-08-09 DIAGNOSIS — M5459 Other low back pain: Secondary | ICD-10-CM | POA: Diagnosis not present

## 2022-08-09 DIAGNOSIS — R262 Difficulty in walking, not elsewhere classified: Secondary | ICD-10-CM

## 2022-08-09 DIAGNOSIS — M25552 Pain in left hip: Secondary | ICD-10-CM

## 2022-08-09 DIAGNOSIS — M6281 Muscle weakness (generalized): Secondary | ICD-10-CM

## 2022-08-09 DIAGNOSIS — M25651 Stiffness of right hip, not elsewhere classified: Secondary | ICD-10-CM

## 2022-08-09 NOTE — Therapy (Signed)
OUTPATIENT PHYSICAL THERAPY TREATMENT NOTE   Patient Name: Blake Roberts MRN: [id number] DOB:1966/07/27, 56 y.o., male Today's Date: 08/09/2022  PCP: Wendie Agreste MD   REFERRING PROVIDER: Jennye Boroughs, MD      END OF SESSION:   PT End of Session - 08/09/22 0757     Visit Number 5    Number of Visits 20    Date for PT Re-Evaluation 09/09/22    Authorization Type [employer] $70 copay    PT Start Time 0758    PT Stop Time 0838    PT Time Calculation (min) 40 min    Activity Tolerance Patient tolerated treatment well    Behavior During Therapy WFL for tasks assessed/performed                Past Medical History:  Diagnosis Date   Acute meniscal tear of knee RIGHT KNEE   Anxiety    Arthritis RIGHT KNEE   Bulging lumbar disc L5 - S1   Depression    Hemochromatosis    Hyperlipidemia    Hypertension    Past Surgical History:  Procedure Laterality Date   KNEE ARTHROSCOPY  AGE 39 (APPROX)   RIGHT KNEE   KNEE ARTHROSCOPY  07/13/2012   Procedure: ARTHROSCOPY KNEE;  Surgeon: Magnus Sinning, MD;  Location: [facility];  Service: Orthopedics;  Laterality: Right;  WITH PARTIAL MEDIAL MENISECTOMY AND REMOVAL OF LOOSE BODIES   torn rotator cuff     Patient Active Problem List   Diagnosis Date Noted   Hyperlipidemia 05/10/2017   Erectile dysfunction 05/10/2017   Memory difficulty 09/26/2016   Hemochromatosis 07/11/2016   Episodic lightheadedness 12/25/2015   Anemia, unspecified 12/25/2015   Chronic left-sided low back pain with sciatica 07/15/2015   Essential hypertension 02/15/2015   Rotator cuff tear 06/09/2014   GERD (gastroesophageal reflux disease) 05/13/2014    REFERRING DIAG: M16.10 (ICD-10-CM) - Hip arthritis  ONSET DATE: 06/25/2021 (over a year)  THERAPY DIAG:  Other low back pain  Pain in left hip  Pain in right hip  Stiffness of left hip, not elsewhere classified  Stiffness of right hip, not elsewhere  classified  Muscle weakness (generalized)  Difficulty in walking, not elsewhere classified  Rationale for Evaluation and Treatment Rehabilitation  PERTINENT HISTORY: Depression, hyperlipidemia, HTN.  Has high ferritin level (checking into with family MD/oncology.  Pt also indicated he has Rt knee pain history  PRECAUTIONS: None  SUBJECTIVE:  Pt indicated feeling some twinges in Rt hip today and recently.  Pt indicated he was able to do yard work with improved tolerance with back related symptoms.   PAIN:  NPRS scale: 1-2/10 Pain location: Rt hip Pain description: dull, achy Aggravating factors:  standing/walking Relieving factors: OTC medicine   PATIENT GOALS  Reduce pain, get back into working out (elliptical, general UE/LE strengthening), standing/walking ability improved  OBJECTIVE:    DIAGNOSTIC FINDINGS: 07/01/2022 review:  MR spine 11/15/19   IMPRESSION: 1. Lumbar dextroscoliosis with moderately advanced multilevel disc and facet degeneration. 2. Moderate spinal stenosis at L3-4 and mild spinal stenosis at L2-3 and L4-5. 3. Left-sided synovial cyst at L3-4 with left lateral recess stenosis and potential left L4 nerve root impingement. 4. Moderate multilevel neural foraminal stenosis.   PATIENT SURVEYS:  07/26/2022 :  FOTO update: 59  07/01/2022 FOTO intake:   47   predicted:   63   COGNITION: 07/01/2022 Overall cognitive status: Within functional limits for tasks assessed  MUSCLE LENGTH: 07/26/2022:  (+) bilateral thomas test for iliopsoas and rectus tightness  07/01/2022 (+) thomas test bilateral   POSTURE:  07/01/2022  Forward trunk posture,    Back/ LOWER EXTREMITY ROM:                         07/01/2022: no centralization/peripherialization.  End range overpressure for hip revealed simliar movement amount.  Joint mobility hypomobile in lumbar and hip in all directions.  Active ROM   Right 07/01/2022 Left 07/01/2022 Right 07/12/2022  Left 07/12/2022 Right 07/26/2022 Left 07/26/2022 08/02/2022 Right 08/09/2022  Left 08/09/2022  Hip flexion 115, measured in supine  115 measured in supine         Hip extension             Hip abduction             Hip adduction             Hip internal rotation 0 measured in supine 90 deg flexion 10 measured in supine 90 deg flexion 12 measured in supine 90 deg flexion 12 measured in supine 90 deg flexion 15 measured in supine 90 deg flexion 15 measured in supine 90 deg flexion  PROM 12 measured in prone PROM 15 measured in prone  Hip external rotation 55 measured in supine 90 deg flexion 55 measured in supine 90 deg flexion   60 measured in supine 90 deg flexion 60 measured in supine 90 deg flexion   75 PROM in 90 deg flexion supine  Knee flexion             Knee extension                           Lumbar flexion To mid shin           Lumbar extension To neutral c pain lumbar/Rt hip buttock        25 % WFL   25 % WFL c pulling in hip/quads 50% WFL                   (Blank rows = not tested)   LOWER EXTREMITY MMT:   MMT Right 07/01/2022 Left 07/01/2022 Right 07/26/2022 Left 07/26/2022  Hip flexion 5/5 5/5 5/5 5/5  Hip extension 4/5 4/5 4/5 5/5  Hip abduction        Hip adduction        Hip internal rotation        Hip external rotation        Knee flexion 5/5 5/5    Knee extension 5/5 5/5    Ankle dorsiflexion 5/5 5/5    Ankle plantarflexion        Ankle inversion        Ankle eversion         (Blank rows = not tested)   LOWER EXTREMITY SPECIAL TESTS:  07/01/2022(-) slump bilateral.  (+) FABER bilateral hip    FUNCTIONAL TESTS:  07/01/2022 18 inch chair transfer s UE assist on 1st try   GAIT: 07/01/2022  Independent ambulation c forward trunk lean, no hip extension bilateral in gait cycle, wider base of support.        TODAY'S TREATMENT: 08/09/2022  Therex:    Nustep lvl 6 10 mins UE/LE   Supine Figure 4 pull towards and push away 30 sec x 3 each, performed bilateral   Supine clam shell pull  aparts blue band x 20 bilateral c isometric hold of contralateral leg Seated reverse clam shell IR of bilateral hip blue band x 20 Standing lumbar extension AROM x 10  Lying flat leg press double leg 100 lbs  x 15, single leg x 15 50 lbs         Manual:   Rt hip PA mobs g4 in end range ER/IR   08/02/2022  Therex:    Prone quad stretch c strap 30 sec x 5 bilateral    Standing lumbar extension x10   Lying flat leg press double leg 100 lbs 2 x 15, single leg x 15 50 bls   Hip hike c press into doorframe hip abduction hold 5 sec hold x 10    Supine bridge 5 sec hold x 15     Manual:   Prone g3 cPA lumbar L2-L5  07/26/2022  Therex:    Supine bridge 2-3 sec hold 2 x 10    Supine figure 4 push away and pull towards 3 x 15 seconds bilateral   Supine thomas test stretching 15 sec x 5 bilateral (cues for home use)   Standing lumbar extension x10    Review of HEP c cues given verbally.  Handout provided c additional exercise for home use.  Cues given on shorter stretching routine prior to activity (gym for instance) and longer stretching otherwise.    Manual:   Prone g3 cPA lumbar L2-L5     PATIENT EDUCATION:  07/26/2022 Education details: HEP progression  Person educated: Patient Education method: Consulting civil engineer, Media planner, Verbal cues, and Handouts Education comprehension: verbalized understanding, returned demonstration, and verbal cues required     HOME EXERCISE PROGRAM: Access Code: [email or web address] URL: [email or web address] Date: 07/26/2022 Prepared by: Scot Jun  Exercises - Supine Lower Trunk Rotation  - 2-3 x daily - 7 x weekly - 1 sets - 3-5 reps - 15 hold - Supine Piriformis Stretch with Foot on Ground  - 2-3 x daily - 7 x weekly - 1 sets - 3-5 reps - 30 hold - Supine Figure 4 Piriformis Stretch  - 2-3 x daily - 7 x weekly - 1 sets - 3-5 reps - 30 hold - Supine Bridge  - 1-2 x daily - 7 x weekly - 1-2 sets - 10-15 reps - 2 hold -  Standing Lumbar Extension with Counter  - 3-5 x daily - 7 x weekly - 1 sets - 5-10 reps - Standing Scapular Retraction  - 3-5 x daily - 7 x weekly - 1 sets - 5-10 reps - Clamshell  - 1-2 x daily - 7 x weekly - 3 sets - 10-15 reps - Sidelying Reverse Clamshell  - 1-2 x daily - 7 x weekly - 3 sets - 10-15 reps - Hip Flexor Stretch at Edge of Bed  - 1-2 x daily - 7 x weekly - 1 sets - 5 reps - 15 hold   ASSESSMENT:   CLINICAL IMPRESSION: Continued reported improvements noted related to tolerance to standing activity due to back improvements to this point. Noted increased in arrival tightness in Rt hip and knee to mobility but able to progress during manual to achieve close to most recent measurements of motion in hip IR.  Continued emphasis on routine stretching program for mobility of lumbar and hip at this time.    OBJECTIVE IMPAIRMENTS Abnormal gait, decreased activity tolerance, decreased endurance, decreased mobility, difficulty walking, decreased ROM, decreased strength, hypomobility, increased fascial restrictions, impaired perceived functional ability, impaired flexibility,  improper body mechanics, postural dysfunction, and pain.    ACTIVITY LIMITATIONS carrying, lifting, bending, standing, squatting, sleeping, stairs, transfers, and locomotion level   PARTICIPATION LIMITATIONS: cleaning, laundry, interpersonal relationship, driving, shopping, community activity, occupation, and exercise   PERSONAL FACTORS  Depression, hyperlipidemia, HTN  are also affecting patient's functional outcome.    REHAB POTENTIAL: Good   CLINICAL DECISION MAKING: Stable/uncomplicated   EVALUATION COMPLEXITY: Low     GOALS: Goals reviewed with patient? Yes   Short term PT Goals (target date for Short term goals are 3 weeks 07/22/2022) Patient will demonstrate independent use of home exercise program to maintain progress from in clinic treatments. Goal status: MET    Long term PT goals (target dates for  all long term goals are 10 weeks  09/09/2022 )   1. Patient will demonstrate/report pain at worst less than or equal to 2/10 to facilitate minimal limitation in daily activity secondary to pain symptoms. Goal status: on going - assessed 07/26/2022   2. Patient will demonstrate independent use of home exercise program to facilitate ability to maintain/progress functional gains from skilled physical therapy services. Goal status: on going - assessed 07/26/2022   3. Patient will demonstrate FOTO outcome > or = 63 % to indicate reduced disability due to condition. Goal status: on going - assessed 07/26/2022   4.  Patient will demonstrate improvement in hip AROM bilateral > 15 degrees each direction to facilitate progress in hip mobility for functional standing/walking.  Goal status: on going - assessed 07/26/2022   5.  Patient will demonstrate lumbar extension AROM > or = 50% to facilitate ability to perform standing/walking s restriction.    Goal status: on going - assessed 07/26/2022   6.  Patient will demonstrate/report ability to perform workout activity s restriction due to symptoms.   Goal status: on going - assessed 07/26/2022         PLAN: PT FREQUENCY: 1-2x/week   PT DURATION: 10 weeks   PLANNED INTERVENTIONS: Therapeutic exercises, Therapeutic activity, Neuro Muscular re-education, Balance training, Gait training, Patient/Family education, Joint mobilization, Stair training, DME instructions, Dry Needling, Electrical stimulation, Cryotherapy, Moist heat, Taping, Ultrasound, Ionotophoresis 26m/ml Dexamethasone, and Manual therapy.  All included unless contraindicated   PLAN FOR NEXT SESSION: Possible HEP transitioning?  FOTO reassessment if so.   MScot Jun PT, DPT, OCS, ATC 08/09/22  8:40 AM

## 2022-08-12 ENCOUNTER — Ambulatory Visit: Payer: BC Managed Care – PPO | Admitting: Physical Medicine & Rehabilitation

## 2022-08-16 ENCOUNTER — Ambulatory Visit: Payer: BC Managed Care – PPO | Admitting: Rehabilitative and Restorative Service Providers"

## 2022-08-16 ENCOUNTER — Encounter: Payer: Self-pay | Admitting: Rehabilitative and Restorative Service Providers"

## 2022-08-16 DIAGNOSIS — M25651 Stiffness of right hip, not elsewhere classified: Secondary | ICD-10-CM

## 2022-08-16 DIAGNOSIS — M25652 Stiffness of left hip, not elsewhere classified: Secondary | ICD-10-CM | POA: Diagnosis not present

## 2022-08-16 DIAGNOSIS — M25551 Pain in right hip: Secondary | ICD-10-CM

## 2022-08-16 DIAGNOSIS — M25552 Pain in left hip: Secondary | ICD-10-CM

## 2022-08-16 DIAGNOSIS — M6281 Muscle weakness (generalized): Secondary | ICD-10-CM

## 2022-08-16 DIAGNOSIS — M5459 Other low back pain: Secondary | ICD-10-CM | POA: Diagnosis not present

## 2022-08-16 DIAGNOSIS — R262 Difficulty in walking, not elsewhere classified: Secondary | ICD-10-CM

## 2022-08-16 NOTE — Therapy (Addendum)
OUTPATIENT PHYSICAL THERAPY TREATMENT NOTE /PROGRESS NOTE /DISCHARGE   Patient Name: Blake Roberts MRN: [id number] DOB:[birth year], 56 y.o., male Today's Date: 08/16/2022  PCP: Wendie Agreste MD   REFERRING PROVIDER: Jennye Boroughs, MD  Progress Note Reporting Period 07/26/2022 to 08/16/2022  See note below for Objective Data and Assessment of Progress/Goals.      END OF SESSION:   PT End of Session - 08/16/22 0808     Visit Number 6    Number of Visits 20    Date for PT Re-Evaluation 09/09/22    Authorization Type [employer] $70 copay    PT Start Time 0800    PT Stop Time 0830    PT Time Calculation (min) 30 min    Activity Tolerance Patient tolerated treatment well    Behavior During Therapy WFL for tasks assessed/performed                 Past Medical History:  Diagnosis Date   Acute meniscal tear of knee RIGHT KNEE   Anxiety    Arthritis RIGHT KNEE   Bulging lumbar disc L5 - S1   Depression    Hemochromatosis    Hyperlipidemia    Hypertension    Past Surgical History:  Procedure Laterality Date   KNEE ARTHROSCOPY  AGE 52 (APPROX)   RIGHT KNEE   KNEE ARTHROSCOPY  07/13/2012   Procedure: ARTHROSCOPY KNEE;  Surgeon: Magnus Sinning, MD;  Location: [facility];  Service: Orthopedics;  Laterality: Right;  WITH PARTIAL MEDIAL MENISECTOMY AND REMOVAL OF LOOSE BODIES   torn rotator cuff     Patient Active Problem List   Diagnosis Date Noted   Hyperlipidemia 05/10/2017   Erectile dysfunction 05/10/2017   Memory difficulty 09/26/2016   Hemochromatosis 07/11/2016   Episodic lightheadedness 12/25/2015   Anemia, unspecified 12/25/2015   Chronic left-sided low back pain with sciatica 07/15/2015   Essential hypertension 02/15/2015   Rotator cuff tear 06/09/2014   GERD (gastroesophageal reflux disease) 05/13/2014    REFERRING DIAG: M16.10 (ICD-10-CM) - Hip arthritis  ONSET DATE: 06/25/2021 (over a year)  THERAPY DIAG:  Other low back  pain  Pain in left hip  Pain in right hip  Stiffness of left hip, not elsewhere classified  Stiffness of right hip, not elsewhere classified  Muscle weakness (generalized)  Difficulty in walking, not elsewhere classified  Rationale for Evaluation and Treatment Rehabilitation  PERTINENT HISTORY: Depression, hyperlipidemia, HTN.  Has high ferritin level (checking into with family MD/oncology.  Pt also indicated he has Rt knee pain history  PRECAUTIONS: None  SUBJECTIVE:  Pt indicated overall doing pretty good.  Feeling a difference for sure.   PAIN:  NPRS scale: at worst 2/10 Pain location: Rt hip Pain description: discomfort Aggravating factors:  standing/walking Relieving factors: OTC medicine   PATIENT GOALS  Reduce pain, get back into working out (elliptical, general UE/LE strengthening), standing/walking ability improved  OBJECTIVE:    DIAGNOSTIC FINDINGS: 07/01/2022 review:  MR spine 11/15/19   IMPRESSION: 1. Lumbar dextroscoliosis with moderately advanced multilevel disc and facet degeneration. 2. Moderate spinal stenosis at L3-4 and mild spinal stenosis at L2-3 and L4-5. 3. Left-sided synovial cyst at L3-4 with left lateral recess stenosis and potential left L4 nerve root impingement. 4. Moderate multilevel neural foraminal stenosis.   PATIENT SURVEYS:  08/16/2022:  FOTO update:  72  07/26/2022 :  FOTO update: 59  07/01/2022 FOTO intake:   47   predicted:   63   COGNITION: 07/01/2022  Overall cognitive status: Within functional limits for tasks assessed                                 MUSCLE LENGTH: 07/26/2022:  (+) bilateral thomas test for iliopsoas and rectus tightness  07/01/2022 (+) thomas test bilateral   POSTURE:  07/01/2022  Forward trunk posture,    Back/ LOWER EXTREMITY ROM:                         07/01/2022: no centralization/peripherialization.  End range overpressure for hip revealed simliar movement amount.  Joint mobility hypomobile in lumbar and  hip in all directions.  Active ROM   Right 07/01/2022 Left 07/01/2022 Right 07/12/2022 Left 07/12/2022 Right 07/26/2022 Left 07/26/2022 08/02/2022 Right 08/09/2022  Left 08/09/2022  Hip flexion 115, measured in supine  115 measured in supine         Hip extension             Hip abduction             Hip adduction             Hip internal rotation 0 measured in supine 90 deg flexion 10 measured in supine 90 deg flexion 12 measured in supine 90 deg flexion 12 measured in supine 90 deg flexion 15 measured in supine 90 deg flexion 15 measured in supine 90 deg flexion  PROM 12 measured in prone PROM 15 measured in prone  Hip external rotation 55 measured in supine 90 deg flexion 55 measured in supine 90 deg flexion   60 measured in supine 90 deg flexion 60 measured in supine 90 deg flexion   75 PROM in 90 deg flexion supine  Knee flexion             Knee extension                           Lumbar flexion To mid shin           Lumbar extension To neutral c pain lumbar/Rt hip buttock        25 % WFL   25 % WFL c pulling in hip/quads 50% WFL                   (Blank rows = not tested)   LOWER EXTREMITY MMT:   MMT Right 07/01/2022 Left 07/01/2022 Right 07/26/2022 Left 07/26/2022  Hip flexion 5/5 5/5 5/5 5/5  Hip extension 4/5 4/5 4/5 5/5  Hip abduction        Hip adduction        Hip internal rotation        Hip external rotation        Knee flexion 5/5 5/5    Knee extension 5/5 5/5    Ankle dorsiflexion 5/5 5/5    Ankle plantarflexion        Ankle inversion        Ankle eversion         (Blank rows = not tested)   LOWER EXTREMITY SPECIAL TESTS:  07/01/2022(-) slump bilateral.  (+) FABER bilateral hip    FUNCTIONAL TESTS:  07/01/2022 18 inch chair transfer s UE assist on 1st try   GAIT: 07/01/2022  Independent ambulation c forward trunk lean, no hip extension bilateral in gait cycle, wider base of support.  TODAY'S TREATMENT: 08/16/2022  Therex:    Nustep lvl 6 10 mins  UE/LE   Standing lumbar extension AROM x 10  Lying flat leg press double leg 100 lbs  x 20, single leg 2 x 15 50 lbs Standing foot in chair hip flexor stretch at wall 30 sec x 3 bilateral Seated isometric multifidi arm lift into table 5 sec hold x 10  Review of existing HEP c verbal cues and visual review of handout.        08/09/2022  Therex:    Nustep lvl 6 10 mins UE/LE   Supine Figure 4 pull towards and push away 30 sec x 3 each, performed bilateral  Supine clam shell pull aparts blue band x 20 bilateral c isometric hold of contralateral leg Seated reverse clam shell IR of bilateral hip blue band x 20 Standing lumbar extension AROM x 10  Lying flat leg press double leg 100 lbs  x 15, single leg x 15 50 lbs         Manual:   Rt hip PA mobs g4 in end range ER/IR   08/02/2022  Therex:    Prone quad stretch c strap 30 sec x 5 bilateral    Standing lumbar extension x10   Lying flat leg press double leg 100 lbs 2 x 15, single leg x 15 50 bls   Hip hike c press into doorframe hip abduction hold 5 sec hold x 10    Supine bridge 5 sec hold x 15     Manual:   Prone g3 cPA lumbar L2-L5    PATIENT EDUCATION:  07/26/2022 Education details: HEP progression  Person educated: Patient Education method: Consulting civil engineer, Media planner, Verbal cues, and Handouts Education comprehension: verbalized understanding, returned demonstration, and verbal cues required     HOME EXERCISE PROGRAM: Access Code: [email or web address] URL: [email or web address] Date: 07/26/2022 Prepared by: Scot Jun  Exercises - Supine Lower Trunk Rotation  - 2-3 x daily - 7 x weekly - 1 sets - 3-5 reps - 15 hold - Supine Piriformis Stretch with Foot on Ground  - 2-3 x daily - 7 x weekly - 1 sets - 3-5 reps - 30 hold - Supine Figure 4 Piriformis Stretch  - 2-3 x daily - 7 x weekly - 1 sets - 3-5 reps - 30 hold - Supine Bridge  - 1-2 x daily - 7 x weekly - 1-2 sets - 10-15 reps - 2 hold - Standing Lumbar  Extension with Counter  - 3-5 x daily - 7 x weekly - 1 sets - 5-10 reps - Standing Scapular Retraction  - 3-5 x daily - 7 x weekly - 1 sets - 5-10 reps - Clamshell  - 1-2 x daily - 7 x weekly - 3 sets - 10-15 reps - Sidelying Reverse Clamshell  - 1-2 x daily - 7 x weekly - 3 sets - 10-15 reps - Hip Flexor Stretch at Edge of Bed  - 1-2 x daily - 7 x weekly - 1 sets - 5 reps - 15 hold   ASSESSMENT:   CLINICAL IMPRESSION: Pt has attended 6 visits overall during course of treatment, reporting improvement in symptoms ( at worst 2/10) as well as FOTO recorded marked improvement in functional ability.  See objective data for updated information.  Pt has continued to make steady overall gains in ability to move and perform daily activity.  At this time, recommend transitioning to HEP trial with return prn based off symptoms and plan to discharge  after 30 days inactivity.    OBJECTIVE IMPAIRMENTS Abnormal gait, decreased activity tolerance, decreased endurance, decreased mobility, difficulty walking, decreased ROM, decreased strength, hypomobility, increased fascial restrictions, impaired perceived functional ability, impaired flexibility, improper body mechanics, postural dysfunction, and pain.    ACTIVITY LIMITATIONS carrying, lifting, bending, standing, squatting, sleeping, stairs, transfers, and locomotion level   PARTICIPATION LIMITATIONS: cleaning, laundry, interpersonal relationship, driving, shopping, community activity, occupation, and exercise   PERSONAL FACTORS  Depression, hyperlipidemia, HTN  are also affecting patient's functional outcome.    REHAB POTENTIAL: Good   CLINICAL DECISION MAKING: Stable/uncomplicated   EVALUATION COMPLEXITY: Low     GOALS: Goals reviewed with patient? Yes   Short term PT Goals (target date for Short term goals are 3 weeks 07/22/2022) Patient will demonstrate independent use of home exercise program to maintain progress from in clinic treatments. Goal  status: MET    Long term PT goals (target dates for all long term goals are 10 weeks  09/09/2022 )   1. Patient will demonstrate/report pain at worst less than or equal to 2/10 to facilitate minimal limitation in daily activity secondary to pain symptoms. Goal status: MET - 08/16/2022   2. Patient will demonstrate independent use of home exercise program to facilitate ability to maintain/progress functional gains from skilled physical therapy services. Goal status: MET - 08/16/2022   3. Patient will demonstrate FOTO outcome > or = 63 % to indicate reduced disability due to condition. Goal status: MET - 08/16/2022   4.  Patient will demonstrate improvement in hip AROM bilateral > 15 degrees each direction to facilitate progress in hip mobility for functional standing/walking.  Goal status: MET - 08/16/2022   5.  Patient will demonstrate lumbar extension AROM > or = 50% to facilitate ability to perform standing/walking s restriction.    Goal status: MET - 08/16/2022   6.  Patient will demonstrate/report ability to perform workout activity s restriction due to symptoms.   Goal status: Mostly MET - 08/16/2022         PLAN: PT FREQUENCY: 1-2x/week   PT DURATION: 10 weeks   PLANNED INTERVENTIONS: Therapeutic exercises, Therapeutic activity, Neuro Muscular re-education, Balance training, Gait training, Patient/Family education, Joint mobilization, Stair training, DME instructions, Dry Needling, Electrical stimulation, Cryotherapy, Moist heat, Taping, Ultrasound, Ionotophoresis 76m/ml Dexamethasone, and Manual therapy.  All included unless contraindicated   PLAN FOR NEXT SESSION: Trial HEP.   Discharge after inactivity > 30 days.   MScot Jun PT, DPT, OCS, ATC 08/16/22  8:33 AM   PHYSICAL THERAPY DISCHARGE SUMMARY  Visits from Start of Care: 6  Current functional level related to goals / functional outcomes: See note   Remaining deficits: See note   Education /  Equipment: HEP  Patient goals were  mostly met . Patient is being discharged due to being pleased with the current functional level./not returning since last visit.   MScot Jun PT, DPT, OCS, ATC 10/04/22  11:31 AM

## 2022-10-31 ENCOUNTER — Ambulatory Visit (INDEPENDENT_AMBULATORY_CARE_PROVIDER_SITE_OTHER): Payer: BC Managed Care – PPO | Admitting: Physician Assistant

## 2022-10-31 ENCOUNTER — Ambulatory Visit (INDEPENDENT_AMBULATORY_CARE_PROVIDER_SITE_OTHER): Payer: BC Managed Care – PPO

## 2022-10-31 ENCOUNTER — Encounter: Payer: Self-pay | Admitting: Physician Assistant

## 2022-10-31 DIAGNOSIS — M25551 Pain in right hip: Secondary | ICD-10-CM

## 2022-10-31 NOTE — Progress Notes (Signed)
Office Visit Note   Patient: Blake Roberts           Date of Birth: 04/26/1966           MRN: [id number] Visit Date: 10/31/2022              Requested by: Blake Agreste, MD [address] [place],  [place] [address] PCP: Blake Agreste, MD   Assessment & Plan: Visit Diagnoses:  1. Pain in right hip     Plan: We will have him undergo repeat intra-articular injection right hip with Dr. Rolena Infante.  He is written out of work for today and tomorrow.  He will follow-up with [place] as needed.  Questions encouraged and answered.   Follow-Up Instructions: Return if symptoms worsen or fail to improve.   Orders:  Orders Placed This Encounter  Procedures   XR Pelvis 1-2 Views   AMB referral to sports medicine   No orders of the defined types were placed in this encounter.     Procedures: No procedures performed   Clinical Data: No additional findings.   Subjective: Chief Complaint  Patient presents with   Right Hip - Pain    HPI Blake Roberts 56 year old male well-known to Dr. Delilah Shan service comes in today with right hip pain.  Last had a hip injection by Dr. Ernestina Patches on 04/19/2022.  States he gave him good relief until a month.  Over the last week his pain is became significantly worse.  He is having sharp stabbing pain in the right hip groin area.  No new injury.  Has been taking Tylenol PM for the hip pain.  Review of Systems  Constitutional:  Negative for chills and fever.     Objective: Vital Signs: There were no vitals taken for this visit.  Physical Exam Constitutional:      Appearance: He is not ill-appearing or diaphoretic.  Pulmonary:     Effort: Pulmonary effort is normal.  Neurological:     Mental Status: He is alert and oriented to person, place, and time.  Psychiatric:        Mood and Affect: Mood normal.     Ortho Exam Bilateral hips: Left hip good range of motion with no significant pain.  Right hip good external rotation without significant pain.   Virtually no internal rotation is quite painful.  Patient ambulates without any assistive device with an antalgic gait.  Specialty Comments:  No specialty comments available.  Imaging: XR Pelvis 1-2 Views  Result Date: 10/31/2022 AP pelvis: Shows further narrowing of the right hip joint space.  No acute fractures or acute injuries.  Near bone-on-bone right hip    PMFS History: Patient Active Problem List   Diagnosis Date Noted   Hyperlipidemia 05/10/2017   Erectile dysfunction 05/10/2017   Memory difficulty 09/26/2016   Hemochromatosis 07/11/2016   Episodic lightheadedness 12/25/2015   Anemia, unspecified 12/25/2015   Chronic left-sided low back pain with sciatica 07/15/2015   Essential hypertension 02/15/2015   Rotator cuff tear 06/09/2014   GERD (gastroesophageal reflux disease) 05/13/2014   Past Medical History:  Diagnosis Date   Acute meniscal tear of knee RIGHT KNEE   Anxiety    Arthritis RIGHT KNEE   Bulging lumbar disc L5 - S1   Depression    Hemochromatosis    Hyperlipidemia    Hypertension     Family History  Problem Relation Age of Onset   Breast cancer Mother    Lung cancer Mother  Diabetes Father        adopted father; no history of biological father   Heart disease Father    Hyperlipidemia Father    Hypertension Father    Kidney disease Father    Heart Problems Brother        has pacemaker also handicapped   Pancreatic cancer Maternal Uncle        age early 55's   Colon cancer Neg Hx     Past Surgical History:  Procedure Laterality Date   KNEE ARTHROSCOPY  AGE 47 (APPROX)   RIGHT KNEE   KNEE ARTHROSCOPY  07/13/2012   Procedure: ARTHROSCOPY KNEE;  Surgeon: Magnus Sinning, MD;  Location: [facility];  Service: Orthopedics;  Laterality: Right;  WITH PARTIAL MEDIAL MENISECTOMY AND REMOVAL OF LOOSE BODIES   torn rotator cuff     Social History   Occupational History   Occupation: paramedic  Tobacco Use   Smoking status: Never    Smokeless tobacco: Never  Substance and Sexual Activity   Alcohol use: Yes    Comment: 4 beers a month,states heavy beer drinker in younger years   Drug use: No   Sexual activity: Yes

## 2022-11-01 ENCOUNTER — Ambulatory Visit (INDEPENDENT_AMBULATORY_CARE_PROVIDER_SITE_OTHER): Payer: BC Managed Care – PPO | Admitting: Sports Medicine

## 2022-11-01 ENCOUNTER — Ambulatory Visit: Payer: Self-pay

## 2022-11-01 DIAGNOSIS — M25551 Pain in right hip: Secondary | ICD-10-CM

## 2022-11-01 MED ORDER — METHYLPREDNISOLONE ACETATE 40 MG/ML IJ SUSP
80.0000 mg | INTRAMUSCULAR | Status: AC | PRN
  Administered 2022-11-01: 80 mg via INTRA_ARTICULAR

## 2022-11-01 MED ORDER — LIDOCAINE HCL 1 % IJ SOLN
4.0000 mL | INTRAMUSCULAR | Status: AC | PRN
  Administered 2022-11-01: 4 mL

## 2022-11-01 NOTE — Progress Notes (Signed)
    Procedure Note  Patient: MARKHI KLECKNER             Date of Birth: [birth year]           MRN: [id number]             Visit Date: 11/01/2022  Procedures: Visit Diagnoses:  1. Pain in right hip    Large Joint Inj: R hip joint on 11/01/2022 11:27 AM Indications: pain Details: 22 G 3.5 in needle, ultrasound-guided anterior approach Medications: 4 mL lidocaine 1 %; 80 mg methylPREDNISolone acetate 40 MG/ML Outcome: tolerated well, no immediate complications  Procedure: US-guided intra-articular hip injection, Right After discussion on risks/benefits/indications and informed verbal consent was obtained, a timeout was performed. Patient was lying supine on exam table. The hip was cleaned with betadine and alcohol swabs. Then utilizing ultrasound guidance, the patient's femoral head and neck junction was identified and subsequently injected with 4:2 lidocaine:depomedrol via an in-plane approach with ultrasound visualization of the injectate administered into the hip joint. Patient tolerated procedure well without immediate complications.  Procedure, treatment alternatives, risks and benefits explained, specific risks discussed. Consent was given by the patient. Immediately prior to procedure a time out was called to verify the correct patient, procedure, equipment, support staff and site/side marked as required. Patient was prepped and draped in the usual sterile fashion.     - I evaluated the patient about 10 minutes post-injection and he had significant improvement in pain and range of motion - follow-up with Dr. Ninfa Linden and Artis Delay as indicated; I am happy to see them as needed  Elba Barman, DO [place]  This note was dictated using Dragon naturally speaking software and may contain errors in syntax, spelling, or content which have not been identified prior to signing this note.

## 2022-12-04 ENCOUNTER — Other Ambulatory Visit: Payer: Self-pay | Admitting: Family Medicine

## 2022-12-04 DIAGNOSIS — I10 Essential (primary) hypertension: Secondary | ICD-10-CM

## 2023-02-07 ENCOUNTER — Other Ambulatory Visit: Payer: Self-pay | Admitting: Family Medicine

## 2023-02-07 DIAGNOSIS — I10 Essential (primary) hypertension: Secondary | ICD-10-CM

## 2023-02-07 DIAGNOSIS — E782 Mixed hyperlipidemia: Secondary | ICD-10-CM

## 2023-02-27 ENCOUNTER — Ambulatory Visit: Payer: Self-pay

## 2023-02-27 ENCOUNTER — Ambulatory Visit: Payer: BC Managed Care – PPO | Admitting: Physician Assistant

## 2023-02-27 ENCOUNTER — Encounter: Payer: Self-pay | Admitting: Orthopaedic Surgery

## 2023-02-27 ENCOUNTER — Ambulatory Visit (INDEPENDENT_AMBULATORY_CARE_PROVIDER_SITE_OTHER): Payer: BC Managed Care – PPO | Admitting: Orthopaedic Surgery

## 2023-02-27 ENCOUNTER — Ambulatory Visit (INDEPENDENT_AMBULATORY_CARE_PROVIDER_SITE_OTHER): Payer: BC Managed Care – PPO | Admitting: Sports Medicine

## 2023-02-27 DIAGNOSIS — M25551 Pain in right hip: Secondary | ICD-10-CM

## 2023-02-27 DIAGNOSIS — M1611 Unilateral primary osteoarthritis, right hip: Secondary | ICD-10-CM | POA: Diagnosis not present

## 2023-02-27 MED ORDER — LIDOCAINE HCL 1 % IJ SOLN
4.0000 mL | INTRAMUSCULAR | Status: AC | PRN
  Administered 2023-02-27: 4 mL

## 2023-02-27 MED ORDER — METHYLPREDNISOLONE ACETATE 40 MG/ML IJ SUSP
80.0000 mg | INTRAMUSCULAR | Status: AC | PRN
  Administered 2023-02-27: 80 mg via INTRA_ARTICULAR

## 2023-02-27 NOTE — Progress Notes (Signed)
   Procedure Note  Patient: Blake Roberts             Date of Birth: 1966/02/22           MRN: [id number]             Visit Date: 02/27/2023  Procedures: Visit Diagnoses:  1. Pain in right hip    Large Joint Inj: R hip joint on 02/27/2023 9:51 AM Indications: pain Details: 22 G 3.5 in needle, ultrasound-guided anterior approach Medications: 4 mL lidocaine 1 %; 80 mg methylPREDNISolone acetate 40 MG/ML Outcome: tolerated well, no immediate complications  Procedure: US-guided intra-articular hip injection, right After discussion on risks/benefits/indications and informed verbal consent was obtained, a timeout was performed. Patient was lying supine on exam table. The hip was cleaned with betadine and alcohol swabs. Then utilizing ultrasound guidance, the patient's femoral head and neck junction was identified and subsequently injected with 4:2 lidocaine:depomedrol via an in-plane approach with ultrasound visualization of the injectate administered into the hip joint. Patient tolerated procedure well without immediate complications.  Procedure, treatment alternatives, risks and benefits explained, specific risks discussed. Consent was given by the patient. Immediately prior to procedure a time out was called to verify the correct patient, procedure, equipment, support staff and site/side marked as required. Patient was prepped and draped in the usual sterile fashion.    - I evaluated the patient about 10 minutes post-injection and he had improvement in pain and range of motion - follow-up with Dr. Ninfa Linden as indicated; I am happy to see them as needed  Elba Barman, DO [place]  This note was dictated using Dragon naturally speaking software and may contain errors in syntax, spelling, or content which have not been identified prior to signing this note.

## 2023-02-27 NOTE — Progress Notes (Signed)
Angelluis is well-known to me.  He is an ENT.  He has well-documented arthritis involving his right hip.  He last had a steroid injection in the right hip 4 months ago under ultrasound by Dr. Rolena Infante.  He said that his work well until recently.  He is still not ready for joint replacement surgery but like to consider another injection in that right hip.  He has recently had a flareup of pain.  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  On exam his left hip moves smoothly and fluidly.  His right hip has some stiffness with rotation and pain in the groin with rotation.  Previous x-rays of his right hip should joint space narrowing.  We will see if we can get him set up again with Dr. Rolena Infante for an ultrasound-guided injection in his right hip.  The patient is requesting that and I agree with this as well.  We talked at length in detail about the potential for hip replacement surgery in the future if the conservative treatment measures fail.  After an intra-articular injection in his right hip, follow-up can be still as needed with [place] until things worsen.  I do run into him in the hospital from time to time and he knows to let us know if things worsen.

## 2023-03-02 ENCOUNTER — Encounter: Payer: Self-pay | Admitting: Radiology

## 2023-05-08 ENCOUNTER — Other Ambulatory Visit: Payer: Self-pay | Admitting: Family Medicine

## 2023-05-08 DIAGNOSIS — I10 Essential (primary) hypertension: Secondary | ICD-10-CM

## 2023-05-11 ENCOUNTER — Encounter: Payer: Self-pay | Admitting: Family Medicine

## 2023-05-11 ENCOUNTER — Ambulatory Visit: Payer: BC Managed Care – PPO | Admitting: Family Medicine

## 2023-05-11 VITALS — BP 112/74 | HR 89 | Temp 98.9°F | Ht 71.0 in | Wt 200.4 lb

## 2023-05-11 DIAGNOSIS — R7989 Other specified abnormal findings of blood chemistry: Secondary | ICD-10-CM

## 2023-05-11 DIAGNOSIS — Z09 Encounter for follow-up examination after completed treatment for conditions other than malignant neoplasm: Secondary | ICD-10-CM

## 2023-05-11 DIAGNOSIS — N529 Male erectile dysfunction, unspecified: Secondary | ICD-10-CM

## 2023-05-11 DIAGNOSIS — E291 Testicular hypofunction: Secondary | ICD-10-CM

## 2023-05-11 DIAGNOSIS — I10 Essential (primary) hypertension: Secondary | ICD-10-CM

## 2023-05-11 DIAGNOSIS — R739 Hyperglycemia, unspecified: Secondary | ICD-10-CM | POA: Diagnosis not present

## 2023-05-11 DIAGNOSIS — L600 Ingrowing nail: Secondary | ICD-10-CM

## 2023-05-11 DIAGNOSIS — Z1211 Encounter for screening for malignant neoplasm of colon: Secondary | ICD-10-CM

## 2023-05-11 DIAGNOSIS — E782 Mixed hyperlipidemia: Secondary | ICD-10-CM

## 2023-05-11 LAB — COMPREHENSIVE METABOLIC PANEL
ALT: 14 U/L (ref 0–53)
AST: 16 U/L (ref 0–37)
Albumin: 4.7 g/dL (ref 3.5–5.2)
Alkaline Phosphatase: 54 U/L (ref 39–117)
BUN: 16 mg/dL (ref 6–23)
CO2: 30 mEq/L (ref 19–32)
Calcium: 9.8 mg/dL (ref 8.4–10.5)
Chloride: 101 mEq/L (ref 96–112)
Creatinine, Ser: 0.93 mg/dL (ref 0.40–1.50)
GFR: 91.18 mL/min (ref >60.00)
Glucose, Bld: 98 mg/dL (ref 70–99)
Potassium: 4.2 mEq/L (ref 3.5–5.1)
Sodium: 140 mEq/L (ref 135–145)
Total Bilirubin: 0.6 mg/dL (ref 0.2–1.2)
Total Protein: 7.9 g/dL (ref 6.0–8.3)

## 2023-05-11 LAB — LIPID PANEL
Cholesterol: 184 mg/dL (ref 0–200)
HDL: 70.6 mg/dL (ref >39.00)
LDL Cholesterol: 99 mg/dL (ref 0–99)
NonHDL: 113.43
Total CHOL/HDL Ratio: 3
Triglycerides: 73 mg/dL (ref 0.0–149.0)
VLDL: 14.6 mg/dL (ref 0.0–40.0)

## 2023-05-11 LAB — CBC
HCT: 42.3 % (ref 39.0–52.0)
Hemoglobin: 14.4 g/dL (ref 13.0–17.0)
MCHC: 34 g/dL (ref 30.0–36.0)
MCV: 97 fl (ref 78.0–100.0)
Platelets: 325 10*3/uL (ref 150.0–400.0)
RBC: 4.36 Mil/uL (ref 4.22–5.81)
RDW: 13.7 % (ref 11.5–15.5)
WBC: 5.6 10*3/uL (ref 4.0–10.5)

## 2023-05-11 LAB — HEMOGLOBIN A1C: Hgb A1c MFr Bld: 5.7 % (ref 4.6–6.5)

## 2023-05-11 MED ORDER — HYDROCHLOROTHIAZIDE 25 MG PO TABS: ORAL_TABLET | ORAL | 1 refills | Status: DC

## 2023-05-11 MED ORDER — AMLODIPINE BESYLATE 10 MG PO TABS
ORAL_TABLET | ORAL | 1 refills | Status: DC
Start: 2023-05-11 — End: 2024-04-29

## 2023-05-11 MED ORDER — ATORVASTATIN CALCIUM 20 MG PO TABS: ORAL_TABLET | ORAL | 1 refills | Status: DC

## 2023-05-11 MED ORDER — TADALAFIL 20 MG PO TABS: 10.0000 mg | ORAL_TABLET | Freq: Every day | ORAL | 3 refills | Status: DC | PRN

## 2023-05-11 NOTE — Patient Instructions (Addendum)
Please call urology for appointment. Let me know if they need a new referral and if you are treated with testosterone will need to be very careful and monitor blood counts with prior elevated ferritin.  [facility] Phone: [phone]  I will check labs today, you have the option to stop the atorvastatin for 1-2 weeks to see if any changes in joint aches, but less likely the cause. If significant improvement, follow up and we can discuss options.   I will refer you to podiatry. Take care.

## 2023-05-11 NOTE — Progress Notes (Signed)
Subjective:  Patient ID: Blake Roberts, male    DOB: January 05, 1966  Age: 57 y.o. MRN: [id number]  CC:  Chief Complaint  Patient presents with   Hyperlipidemia   Hypertension    Pt is here today to F/U. Pt is requesting labs done today. Pt reports issues with atorvastatin  Pt reports issues with ED. Pt reports left big toe ingrown . Pt would like to discuss his hydrochlorothiazide (HYDRODIURIL) 25 MG     HPI Blake Roberts presents for follow up.  Doing great. Has lost about 25 pounds. Relationship going well.  Followed by ortho for R hip pain, looking at possible replacement in next year.   Hypertension: Amlodipine 10 mg, hydrochlorothiazide 25 mg daily. Home readings: 120/70's. No lightheadedness/dizziness.  BP Readings from Last 3 Encounters:  05/11/23 112/74  07/25/22 (!) 145/88  07/18/22 (!) 158/90   Lab Results  Component Value Date   CREATININE 0.98 07/18/2022   Hyperlipidemia: Lipitor 20 mg daily. Has pain in various joints, including shoulder with prior surgery, back, R hip.  Lab Results  Component Value Date   CHOL 187 06/20/2022   HDL 67.30 06/20/2022   LDLCALC 86 05/29/2020   LDLDIRECT 94.0 06/20/2022   TRIG 283.0 (H) 06/20/2022   CHOLHDL 3 06/20/2022   Lab Results  Component Value Date   ALT 31 07/18/2022   AST 28 07/18/2022   ALKPHOS 48 07/18/2022   BILITOT 0.5 07/18/2022   Erectile dysfunction Treated with Cialis 20 mg 1/2-1 as needed.  Out of meds. Worked well when taking. No hearing/vision changes on med. No CP with exertion.  Low testosterone noted last year.  Previously treated with AndroGel.  Testosterone 176 on 06/20/2022, 230 on 06/30/2022.  He was referred to urology last June.Letter noted from April 16, from urology, had not responded to multiple attempts to schedule appointment. He did not receive a call from urology.   Elevated ferritin Evaluated by hematology last July.  Dr. Candise Che. Did not appear to have hereditary hemochromatosis.   Ferritin levels have improved.  Alcohol cessation recommended as likely cause of iron overload along with transaminitis with alcohol use.  Avoidance of hepatotoxic medications and avoidance of iron supplementation or cooking in cast iron skillet.  PCP, option to donate blood couple times a year to control iron overload.  Follow-up as needed.   Treated in the ER for alcohol withdrawal in July 2023, treated with Librium taper, resources given for alcohol cessation.  Did not meet with anyone last year.  He is still drinking alcohol. 4 beers twice per week. Denies need for assistance with cessation at this time.  No DUI recently, no work or relationship difficulties with alcohol.   Lab Results  Component Value Date   WBC 7.5 07/18/2022   HGB 14.3 07/18/2022   HCT 42.2 07/18/2022   MCV 97.9 07/18/2022   PLT 287 07/18/2022   Lab Results  Component Value Date   FERRITIN 492.2 (H) 06/20/2022   Hyperglycemia Noted on prior labs, most recently 152 on 07/18/2022. Lab Results  Component Value Date   HGBA1C 5.6 05/29/2020   Ingrown toenail Left great toe past few months, tried otc home remedies. No recent acute changes. No bleeding/discharge. Has tried to cut at home unsuccessfully.   HM: Overdue for colonoscopy - 3 year repeat planned in 2017. Agrees to referral.      History Patient Active Problem List   Diagnosis Date Noted   Unilateral primary osteoarthritis, right hip 02/27/2023  Hyperlipidemia 05/10/2017   Erectile dysfunction 05/10/2017   Memory difficulty 09/26/2016   Hemochromatosis 07/11/2016   Episodic lightheadedness 12/25/2015   Anemia, unspecified 12/25/2015   Chronic left-sided low back pain with sciatica 07/15/2015   Essential hypertension 02/15/2015   Rotator cuff tear 06/09/2014   GERD (gastroesophageal reflux disease) 05/13/2014   Past Medical History:  Diagnosis Date   Acute meniscal tear of knee RIGHT KNEE   Anxiety    Arthritis RIGHT KNEE   Bulging  lumbar disc L5 - S1   Depression    Hemochromatosis    Hyperlipidemia    Hypertension    Past Surgical History:  Procedure Laterality Date   KNEE ARTHROSCOPY  AGE 38 (APPROX)   RIGHT KNEE   KNEE ARTHROSCOPY  07/13/2012   Procedure: ARTHROSCOPY KNEE;  Surgeon: Drucilla Schmidt, MD;  Location: [facility];  Service: Orthopedics;  Laterality: Right;  WITH PARTIAL MEDIAL MENISECTOMY AND REMOVAL OF LOOSE BODIES   torn rotator cuff     Allergies  Allergen Reactions   Ace Inhibitors Other (See Comments)    ANGIOEDEMA OF JOINTS- SEVERE   Prior to Admission medications   Medication Sig Start Date End Date Taking? Authorizing Provider  amLODipine (NORVASC) 10 MG tablet TAKE 1 TABLET(10 MG) BY MOUTH DAILY 02/07/23  Yes Shade Flood, MD  atorvastatin (LIPITOR) 20 MG tablet TAKE 1 TABLET(20 MG) BY MOUTH DAILY 02/07/23  Yes Shade Flood, MD  hydrochlorothiazide (HYDRODIURIL) 25 MG tablet TAKE 1 TABLET(25 MG) BY MOUTH DAILY 05/08/23  Yes Shade Flood, MD  tadalafil (CIALIS) 20 MG tablet Take 0.5 tablets (10 mg total) by mouth daily as needed for erectile dysfunction. 01/10/22  Yes Shade Flood, MD  naproxen sodium (ANAPROX) 550 MG tablet     [provider]   Social History   Socioeconomic History   Marital status: Legally Separated    Spouse name: Not on file   Number of children: 0   Years of education: BA   Highest education level: Not on file  Occupational History   Occupation: paramedic  Tobacco Use   Smoking status: Never   Smokeless tobacco: Never  Substance and Sexual Activity   Alcohol use: Yes    Comment: 4 beers a month,states heavy beer drinker in younger years   Drug use: No   Sexual activity: Yes  Other Topics Concern   Not on file  Social History Narrative   Lives at home alone   Separated, 2 step-daughters   Right-handed   Caffeine: 2 diet Cokes per day   Social Determinants of Health   Financial Resource Strain: Not on  file  Food Insecurity: Not on file  Transportation Needs: Not on file  Physical Activity: Not on file  Stress: Not on file  Social Connections: Not on file  Intimate Partner Violence: Not on file    Review of Systems  Per hpi.  Objective:   Vitals:   05/11/23 0830  BP: 112/74  Pulse: 89  Temp: 98.9 F (37.2 C)  SpO2: 98%  Weight: 200 lb 6 oz (90.9 kg)  Height: 5\' 11"  (1.803 m)     Physical Exam Vitals reviewed.  Constitutional:      Appearance: He is well-developed.  HENT:     Head: Normocephalic and atraumatic.  Neck:     Vascular: No carotid bruit or JVD.  Cardiovascular:     Rate and Rhythm: Normal rate and regular rhythm.     Heart sounds:  Normal heart sounds. No murmur heard. Pulmonary:     Effort: Pulmonary effort is normal.     Breath sounds: Normal breath sounds. No rales.  Musculoskeletal:     Right lower leg: No edema.     Left lower leg: No edema.  Skin:    General: Skin is warm and dry.     Comments: Left foot, great toe nail with thickened, discolored nail, ingrown component to the lateral great toe without surrounding skin erythema discharge or bleeding.  Neurological:     Mental Status: He is alert and oriented to person, place, and time.  Psychiatric:        Mood and Affect: Mood normal.        Assessment & Plan:  Blake Roberts is a 57 y.o. male . Essential hypertension  -Stable in office, improved from previous readings.  Continue same regimen with home monitoring, continue to monitor and minimize alcohol intake.  No med changes for now, check labs with medication adjustments accordingly.  Mixed hyperlipidemia - Plan: Comprehensive metabolic panel, Lipid panel, atorvastatin (LIPITOR) 20 MG tablet  -Tolerating current dose statin, continue same, check labs.  Adjustment in plan accordingly.  Elevated ferritin - Plan: CBC, Iron, TIBC and Ferritin Panel  -Check updated labs, also advised that if he is started on testosterone with urology  will need close monitoring of CBC to evaluate for polycythemia that may occur with testosterone supplementation.  Depending on ferritin level consider blood donation.  Decreased alcohol as above.  Hypogonadism male Erectile dysfunction, unspecified erectile dysfunction type - Plan: tadalafil (CIALIS) 20 MG tablet  -Cialis refilled, previously referred to urology, phone number provided.  Ingrowing toenail of left foot - Plan: Ambulatory referral to Podiatry  -No current sign of infection, onychomycotic nail will be difficult to trim, refer to podiatry for treatment options.  Screen for colon cancer - Plan: Ambulatory referral to Gastroenterology  Hyperglycemia - Plan: Hemoglobin A1c  -Screen for diabetes/prediabetes with prior hyperglycemia.  Uncontrolled hypertension - Plan: amLODipine (NORVASC) 10 MG tablet, hydrochlorothiazide (HYDRODIURIL) 25 MG tablet  -As above, now controlled.  No med changes at this time.   Meds ordered this encounter  Medications   amLODipine (NORVASC) 10 MG tablet    Sig: TAKE 1 TABLET(10 MG) BY MOUTH DAILY    Dispense:  90 tablet    Refill:  1   atorvastatin (LIPITOR) 20 MG tablet    Sig: TAKE 1 TABLET(20 MG) BY MOUTH DAILY    Dispense:  90 tablet    Refill:  1   hydrochlorothiazide (HYDRODIURIL) 25 MG tablet    Sig: TAKE 1 TABLET(25 MG) BY MOUTH DAILY    Dispense:  90 tablet    Refill:  1   tadalafil (CIALIS) 20 MG tablet    Sig: Take 0.5 tablets (10 mg total) by mouth daily as needed for erectile dysfunction.    Dispense:  6 tablet    Refill:  3   Patient Instructions  Please call urology for appointment. Let me know if they need a new referral and if you are treated with testosterone will need to be very careful and monitor blood counts with prior elevated ferritin.  [facility] Phone: [phone]  I will check labs today, you have the option to stop the atorvastatin for 1-2 weeks to see if any changes in joint aches, but less  likely the cause. If significant improvement, follow up and we can discuss options.   I will refer you  to podiatry. Take care.     Signed,   Meredith Staggers, MD [facility], [facility] 05/11/23 9:10 AM

## 2023-05-12 LAB — IRON,TIBC AND FERRITIN PANEL
%SAT: 23 % (calc) (ref 20–48)
Ferritin: 288 ng/mL (ref 38–380)
Iron: 79 ug/dL (ref 50–180)
TIBC: 344 mcg/dL (calc) (ref 250–425)

## 2023-05-15 ENCOUNTER — Encounter: Payer: Self-pay | Admitting: Hematology

## 2023-05-16 ENCOUNTER — Encounter: Payer: Self-pay | Admitting: Hematology

## 2023-05-23 ENCOUNTER — Ambulatory Visit (INDEPENDENT_AMBULATORY_CARE_PROVIDER_SITE_OTHER): Payer: BC Managed Care – PPO | Admitting: Podiatry

## 2023-05-23 ENCOUNTER — Encounter: Payer: Self-pay | Admitting: Podiatry

## 2023-05-23 DIAGNOSIS — L600 Ingrowing nail: Secondary | ICD-10-CM | POA: Diagnosis not present

## 2023-05-23 DIAGNOSIS — B351 Tinea unguium: Secondary | ICD-10-CM

## 2023-05-23 DIAGNOSIS — L603 Nail dystrophy: Secondary | ICD-10-CM | POA: Diagnosis not present

## 2023-05-23 NOTE — Patient Instructions (Signed)

## 2023-05-23 NOTE — Progress Notes (Signed)
Subjective:  Patient ID: POE RINIKER, male    DOB: 1966-06-06,   MRN: [id number]  Chief Complaint  Patient presents with   Nail Problem     Left great  toe nail patient states nail has been like this for awhile states its only painful if someone steps on it     57 y.o. male presents for concern of left great toenail that has been bothering him for quite a while. Relates the inside border is tender and he has tried trimming it back. Relates thickness and discoloration as well . Denies any other pedal complaints. Denies n/v/f/c.   Past Medical History:  Diagnosis Date   Acute meniscal tear of knee RIGHT KNEE   Anxiety    Arthritis RIGHT KNEE   Bulging lumbar disc L5 - S1   Depression    Hemochromatosis    Hyperlipidemia    Hypertension     Objective:  Physical Exam: Vascular: DP/PT pulses 2/4 bilateral. CFT <3 seconds. Normal hair growth on digits. No edema.  Skin. No lacerations or abrasions bilateral feet. Left hallux nail thickened and dystrophic. Lateral border incurvated with tenderness to touch. No erythema edema or purulence noted.  Musculoskeletal: MMT 5/5 bilateral lower extremities in DF, PF, Inversion and Eversion. Deceased ROM in DF of ankle joint.  Neurological: Sensation intact to light touch.   Assessment:   1. Onychomycosis   2. Ingrown left greater toenail      Plan:  Patient was evaluated and treated and all questions answered. Discussed ingrown toenails etiology and treatment options including procedure for removal vs conservative care.  Patient requesting removal of ingrown nail today. Procedure below.  Discussed procedure and post procedure care and patient expressed understanding.  Will follow-up in 2 weeks for nail check or sooner if any problems arise.   -Discussed treatment options for painful dystrophic nails  -Clinical picture and Fungal culture was obtained by removing a portion of the hard nail itself from each of the involved toenails  using a sterile nail nipper and sent to [facility] lab. Patient tolerated the biopsy procedure well without discomfort or need for anesthesia.  -Discussed fungal nail treatment options including oral, topical, and laser treatments.   Procedure:  Procedure: partial Nail Avulsion of left hallux lateral nail border.  Surgeon: Louann Sjogren, DPM  Pre-op Dx: Ingrown toenail without infection Post-op: Same  Place of Surgery: Office exam room.  Indications for surgery: Painful and ingrown toenail.    The patient is requesting removal of nail with chemical matrixectomy. Risks and complications were discussed with the patient for which they understand and written consent was obtained. Under sterile conditions a total of 3 mL of  1% lidocaine plain was infiltrated in a hallux block fashion. Once anesthetized, the skin was prepped in sterile fashion. A tourniquet was then applied. Next the lateral aspect of hallux nail border was then sharply excised making sure to remove the entire offending nail border.  Next phenol was then applied under standard conditions and copiously irrigated. Silvadene was applied. A dry sterile dressing was applied. After application of the dressing the tourniquet was removed and there is found to be an immediate capillary refill time to the digit. The patient tolerated the procedure well without any complications. Post procedure instructions were discussed the patient for which he verbally understood. Follow-up in two weeks for nail check or sooner if any problems are to arise. Discussed signs/symptoms of infection and directed to call the office immediately should any occur  or go directly to the emergency room. In the meantime, encouraged to call the office with any questions, concerns, changes symptoms.   Louann Sjogren, DPM

## 2023-06-06 ENCOUNTER — Ambulatory Visit: Payer: BC Managed Care – PPO | Admitting: Podiatry

## 2023-07-18 ENCOUNTER — Telehealth: Payer: Self-pay | Admitting: Orthopaedic Surgery

## 2023-07-18 NOTE — Telephone Encounter (Signed)
Patiengt called needed to be seen about pain in right hip. Call to see if you can get him as soon as possible. [phone]

## 2023-08-01 ENCOUNTER — Ambulatory Visit (INDEPENDENT_AMBULATORY_CARE_PROVIDER_SITE_OTHER): Payer: BC Managed Care – PPO | Admitting: Sports Medicine

## 2023-08-01 ENCOUNTER — Encounter: Payer: Self-pay | Admitting: Physician Assistant

## 2023-08-01 ENCOUNTER — Ambulatory Visit: Payer: BC Managed Care – PPO | Admitting: Physician Assistant

## 2023-08-01 ENCOUNTER — Ambulatory Visit (INDEPENDENT_AMBULATORY_CARE_PROVIDER_SITE_OTHER): Payer: BC Managed Care – PPO

## 2023-08-01 ENCOUNTER — Other Ambulatory Visit: Payer: Self-pay

## 2023-08-01 DIAGNOSIS — M25551 Pain in right hip: Secondary | ICD-10-CM | POA: Diagnosis not present

## 2023-08-01 DIAGNOSIS — M1611 Unilateral primary osteoarthritis, right hip: Secondary | ICD-10-CM

## 2023-08-01 DIAGNOSIS — M1711 Unilateral primary osteoarthritis, right knee: Secondary | ICD-10-CM

## 2023-08-01 MED ORDER — METHYLPREDNISOLONE ACETATE 40 MG/ML IJ SUSP
80.0000 mg | INTRAMUSCULAR | Status: AC | PRN
Start: 2023-08-01 — End: 2023-08-01
  Administered 2023-08-01: 80 mg via INTRA_ARTICULAR

## 2023-08-01 MED ORDER — LIDOCAINE HCL 1 % IJ SOLN
4.0000 mL | INTRAMUSCULAR | Status: AC | PRN
Start: 2023-08-01 — End: 2023-08-01
  Administered 2023-08-01: 4 mL
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
## 2023-08-01 NOTE — Progress Notes (Signed)
   Procedure Note  Patient: Blake Roberts             Date of Birth: 01/07/1966           MRN: [id number]             Visit Date: 08/01/2023  Procedures: Visit Diagnoses:  1. Pain in right hip   2. Unilateral primary osteoarthritis, right hip    Large Joint Inj: R hip joint on 08/01/2023 9:15 AM Indications: pain Details: 22 G 3.5 in needle, ultrasound-guided anterior approach Medications: 4 mL lidocaine 1 %; 80 mg methylPREDNISolone acetate 40 MG/ML Outcome: tolerated well, no immediate complications Procedure, treatment alternatives, risks and benefits explained, specific risks discussed. Consent was given by the patient. Immediately prior to procedure a time out was called to verify the correct patient, procedure, equipment, support staff and site/side marked as required. Patient was prepped and draped in the usual sterile fashion.     - tolerated procedure well without AE's - follow-up with Dr. Magnus Ivan and Rexene Edison as indicated; I am happy to see them as needed  Madelyn Brunner, DO Primary Care Sports Medicine Physician  [facility] - Orthopedics  This note was dictated using Dragon naturally speaking software and may contain errors in syntax, spelling, or content which have not been identified prior to signing this note.

## 2023-08-31 DIAGNOSIS — R350 Frequency of micturition: Secondary | ICD-10-CM | POA: Diagnosis not present

## 2023-08-31 DIAGNOSIS — N529 Male erectile dysfunction, unspecified: Secondary | ICD-10-CM | POA: Diagnosis not present

## 2023-09-05 ENCOUNTER — Encounter (HOSPITAL_BASED_OUTPATIENT_CLINIC_OR_DEPARTMENT_OTHER): Payer: Self-pay

## 2023-09-05 ENCOUNTER — Telehealth: Payer: Self-pay | Admitting: Physician Assistant

## 2023-09-05 NOTE — Telephone Encounter (Signed)
Patient called asked if Blake Roberts would write a note for him to stay out of work today through Thursday. Patient said he is having severe right hip pain. Patient has an appointment next week.  The number to contact patient si [phone]

## 2023-09-05 NOTE — Telephone Encounter (Signed)
Lvm advising  

## 2023-09-05 NOTE — Telephone Encounter (Signed)
Note completed and sent to pt's mychart 

## 2023-09-11 ENCOUNTER — Encounter: Payer: Self-pay | Admitting: Physician Assistant

## 2023-09-11 ENCOUNTER — Ambulatory Visit: Payer: BC Managed Care – PPO | Admitting: Physician Assistant

## 2023-09-11 DIAGNOSIS — M1611 Unilateral primary osteoarthritis, right hip: Secondary | ICD-10-CM | POA: Diagnosis not present

## 2023-09-11 NOTE — Progress Notes (Signed)
HPI: Blake Roberts returns today for follow-up after right hip injection 08/01/2023.  States the injection was helpful.  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  He was unable to bear weight on the hip.  He has been out of work for the last week.  He is concerned about going forward in regards to his right hip.  Again he has near bone-on-bone right hip arthritis.  She does note currently his right hip is not bothering him much.  He is considering possible hip surgery.  Review of systems: Denies any chest pain shortness of breath fevers chills or ongoing infection.  Physical exam: General Well-developed well-nourished male walks with slight antalgic gait.  No assistive device.  Bilateral hips: Left hip excellent range of motion without pain.  Right hip good external rotation with virtually no internal rotation.  Impression: Right hip osteoarthritis  Plan: Discussed with patient right total hip arthroplasty surgery.  Risk benefits of surgery discussed.  Risk include but are not limited to PE/DVT, nerve vessel injury, blood loss, prolonged pain, worsening pain, infection and wound healing problems.  Discussed postoperative protocol with him.  He like to think about maybe undergoing surgery later on this year.  He will let us know.  Questions were encouraged and answered at length.

## 2023-10-27 ENCOUNTER — Ambulatory Visit: Payer: BC Managed Care – PPO | Admitting: Family Medicine

## 2023-10-27 ENCOUNTER — Encounter: Payer: Self-pay | Admitting: Family Medicine

## 2023-10-27 ENCOUNTER — Ambulatory Visit (INDEPENDENT_AMBULATORY_CARE_PROVIDER_SITE_OTHER)
Admission: RE | Admit: 2023-10-27 | Discharge: 2023-10-27 | Disposition: A | Discharge status: Home / Self Care | Payer: BC Managed Care – PPO | Source: Ambulatory Visit | Attending: Family Medicine | Admitting: Family Medicine

## 2023-10-27 VITALS — BP 144/78 | HR 104 | Temp 98.7°F | Ht 71.0 in | Wt 206.8 lb

## 2023-10-27 DIAGNOSIS — M25512 Pain in left shoulder: Secondary | ICD-10-CM

## 2023-10-27 DIAGNOSIS — M79622 Pain in left upper arm: Secondary | ICD-10-CM

## 2023-10-27 DIAGNOSIS — M19012 Primary osteoarthritis, left shoulder: Secondary | ICD-10-CM | POA: Diagnosis not present

## 2023-10-27 MED ORDER — TRAMADOL HCL 50 MG PO TABS
50.0000 mg | ORAL_TABLET | Freq: Three times a day (TID) | ORAL | 0 refills | Status: AC | PRN
Start: 2023-10-27 — End: 2023-11-01

## 2023-10-27 MED ORDER — MELOXICAM 7.5 MG PO TABS: 7.5000 mg | ORAL_TABLET | Freq: Every day | ORAL | 0 refills | Status: DC | PRN

## 2023-10-27 NOTE — Progress Notes (Signed)
Subjective:  Patient ID: Blake Roberts, male    DOB: 1966/05/30  Age: 57 y.o. MRN: [id number]  CC:  Chief Complaint  Patient presents with   Shoulder Injury    Left shoulder injury and pain     HPI [name] presents for   Left shoulder pain Started 2 days ago - twinge as a discomfort moving a patient - front of left shoulder, moving next patient - felt a pop in same area. Had some prior pain in left shouder that worsened. Prior left shoulder surgery. Sore since with certain movements - hurts to reach back backward. Called surgeon - appt with PA at that practice on 11/11.  Tx: ibuprofen 400-600mg  once per day in afternoon. Hurts to sleep - getting comfortable is the worst part.  Pain in left bicep - hurts to curl, tight - felt that with initial injury as well - lower bicep.  No prior left bicep injury.    Prior left shoulder surgery with Dr.  Magnus Ivan in 2022.  Note reviewed from 06/24/2021.  Shoulder arthroscopy 1 week prior.  Impingement shoulder with tendinosis of the rotator cuff and spurring underneath the acromion.  Cartilage loss on glenoid face and synovitis throughout the glenohumeral joint area.  Performed extensive debridement.  No hx of PUD or CKD.   Controlled substance database (PDMP) reviewed. No concerns appreciated.  Tramadol in May 2023, chlordiazepoxide in July 2023.   History Patient Active Problem List   Diagnosis Date Noted   Unilateral primary osteoarthritis, right hip 02/27/2023   Hyperlipidemia 05/10/2017   Erectile dysfunction 05/10/2017   Memory difficulty 09/26/2016   Hemochromatosis 07/11/2016   Episodic lightheadedness 12/25/2015   Anemia, unspecified 12/25/2015   Chronic left-sided low back pain with sciatica 07/15/2015   Essential hypertension 02/15/2015   Rotator cuff tear 06/09/2014   GERD (gastroesophageal reflux disease) 05/13/2014   Past Medical History:  Diagnosis Date   Acute meniscal tear of knee RIGHT KNEE   Anxiety     Arthritis RIGHT KNEE   Bulging lumbar disc L5 - S1   Depression    Hemochromatosis    Hyperlipidemia    Hypertension    Past Surgical History:  Procedure Laterality Date   KNEE ARTHROSCOPY  AGE 83 (APPROX)   RIGHT KNEE   KNEE ARTHROSCOPY  07/13/2012   Procedure: ARTHROSCOPY KNEE;  Surgeon: Drucilla Schmidt, MD;  Location: [facility];  Service: Orthopedics;  Laterality: Right;  WITH PARTIAL MEDIAL MENISECTOMY AND REMOVAL OF LOOSE BODIES   torn rotator cuff     Allergies  Allergen Reactions   Ace Inhibitors Other (See Comments)    ANGIOEDEMA OF JOINTS- SEVERE   Prior to Admission medications   Medication Sig Start Date End Date Taking? Authorizing Provider  amLODipine (NORVASC) 10 MG tablet TAKE 1 TABLET(10 MG) BY MOUTH DAILY 05/11/23  Yes Shade Flood, MD  atorvastatin (LIPITOR) 20 MG tablet TAKE 1 TABLET(20 MG) BY MOUTH DAILY 05/11/23  Yes Shade Flood, MD  hydrochlorothiazide (HYDRODIURIL) 25 MG tablet TAKE 1 TABLET(25 MG) BY MOUTH DAILY 05/11/23  Yes Shade Flood, MD  tadalafil (CIALIS) 20 MG tablet Take 0.5 tablets (10 mg total) by mouth daily as needed for erectile dysfunction. 05/11/23  Yes Shade Flood, MD   Social History   Socioeconomic History   Marital status: Legally Separated    Spouse name: Not on file   Number of children: 0   Years of education: BA   Highest  education level: Not on file  Occupational History   Occupation: paramedic  Tobacco Use   Smoking status: Never   Smokeless tobacco: Never  Substance and Sexual Activity   Alcohol use: Yes    Comment: 4 beers a month,states heavy beer drinker in younger years   Drug use: No   Sexual activity: Yes  Other Topics Concern   Not on file  Social History Narrative   Lives at home alone   Separated, 2 step-daughters   Right-handed   Caffeine: 2 diet Cokes per day   Social Determinants of Health   Financial Resource Strain: Not on file  Food Insecurity: Not on file   Transportation Needs: Not on file  Physical Activity: Not on file  Stress: Not on file  Social Connections: Not on file  Intimate Partner Violence: Not on file    Review of Systems   Objective:   Vitals:   10/27/23 1204  BP: (!) 144/78  Pulse: (!) 104  Temp: 98.7 F (37.1 C)  TempSrc: Temporal  SpO2: 95%  Weight: 206 lb 12.8 oz (93.8 kg)  Height: 5\' 11"  (1.803 m)     Physical Exam Constitutional:      General: He is not in acute distress.    Appearance: Normal appearance. He is well-developed.  HENT:     Head: Normocephalic and atraumatic.  Cardiovascular:     Rate and Rhythm: Normal rate.  Pulmonary:     Effort: Pulmonary effort is normal.  Musculoskeletal:     Comments: C-spine, pain-free range of motion, does not reproduce shoulder pain.  Left shoulder, no bony tenderness.  Skin intact.  AC, [place], clavicle nontender.  Active range of motion limited to approximately 90 to 100 degrees of flexion, 110 degrees of abduction.  Somewhat guarded internal rotation.  Negative empty can, no apparent weakness with rotator cuff testing.  Positive Neer's, negative Hawkins.  Negative drop arm testing. Left biceps, tender to palpation at distal biceps without apparent defect or retraction noted on resistance testing.  Proximal bicep nontender, bicipital groove nontender.  Skin intact without ecchymosis or swelling.     Neurological:     Mental Status: He is alert and oriented to person, place, and time.  Psychiatric:        Mood and Affect: Mood normal.        Assessment & Plan:  Blake Roberts is a 57 y.o. male . Left shoulder pain, unspecified chronicity - Plan: traMADol (ULTRAM) 50 MG tablet, meloxicam (MOBIC) 7.5 MG tablet, DG Shoulder Left  Left upper arm pain - Plan: traMADol (ULTRAM) 50 MG tablet, meloxicam (MOBIC) 7.5 MG tablet, DG Shoulder Left Left anterior shoulder pain, left distal biceps pain after lifting few days ago, had previous impingement arthroscopy 2  years ago.  Intermittent discomfort prior to recent injury but did feel a pop as above.  Discomfort over distal biceps without definitive defect or retraction.  Possible partial tear of biceps.  Pain with Neer testing abduction, flexion somewhat limited but no significant weakness appreciated with rotator cuff testing.  Partial tear possible versus labral tear versus worsening of prior impingement.  -Check imaging of left shoulder, restrictions for lifting and use of that arm for now until seen by Ortho.  Note provided.  -Meloxicam if needed during the day, tramadol at night with potential side effects and risk discussed.  RTC precautions.  Meds ordered this encounter  Medications   traMADol (ULTRAM) 50 MG tablet    Sig: Take 1  tablet (50 mg total) by mouth every 8 (eight) hours as needed for up to 5 days.    Dispense:  15 tablet    Refill:  0   meloxicam (MOBIC) 7.5 MG tablet    Sig: Take 1 tablet (7.5 mg total) by mouth daily as needed for pain.    Dispense:  30 tablet    Refill:  0   Patient Instructions  Keep follow-up with orthopedics as planned to discuss the left shoulder and left upper arm soreness.  As we discussed you could have a small tear of the biceps but I do not appreciate 1 on exam today.  Avoid use of that arm for now other than range of motion.  Could also have some more impingement or other injury within the left shoulder.  Please have x-ray performed at [facility] location below.  Avoid use of that arm until evaluated by orthopedics.  Range of motion only if tolerated.  [facility] or xray: Walk in 8:30-4:30 during weekdays, no appointment needed [address]  [place], [place] [address]   Shoulder Pain Many things can cause shoulder pain, including: An injury to the shoulder. Overuse of the shoulder. Arthritis. The source of the pain can be: Inflammation. An injury to the shoulder joint. An injury to a tendon, ligament, or bone. Follow these instructions at  home: Pay attention to changes in your symptoms. Let your health care provider know about them. Follow these instructions to relieve your pain. If you have a removable sling: Wear the sling as told by your provider. Remove it only as told by your provider. Check the skin around the sling every day. Tell your provider about any concerns. Loosen the sling if your fingers tingle, become numb, or become cold. Keep the sling clean. If the sling is not waterproof: Do not let it get wet. Remove it to shower or bathe. Move your arm as little as possible, but keep your hand moving to prevent swelling. Managing pain, stiffness, and swelling  If told, put ice on the painful area. If you have a removable sling or immobilizer, remove it as told by your provider. Put ice in a plastic bag. Place a towel between your skin and the bag. Leave the ice on for 20 minutes, 2-3 times a day. If your skin turns bright red, remove the ice right away to prevent skin damage. The risk of damage is higher if you cannot feel pain, heat, or cold. Move your fingers often to reduce stiffness and swelling. Squeeze a soft ball or a foam pad as much as possible. This helps to keep the shoulder from swelling. It also helps to strengthen the arm. General instructions Take over-the-counter and prescription medicines only as told by your provider. Exercise may help with pain management. Perform exercises if told by your provider. You may be referred to a physical therapist to help in your recovery process. Keep all follow-up visits in order to avoid any type of permanent shoulder disability or chronic pain problems. Contact a health care provider if: Your pain is not relieved with medicines. New pain develops in your arm, hand, or fingers. You loosen your sling and your arm, hand, or fingers remain tingly, numb, swollen, or painful. Get help right away if: Your arm, hand, or fingers turn white or blue. This information is  not intended to replace advice given to you by your health care provider. Make sure you discuss any questions you have with your health  care provider. Document Revised: 07/15/2022 Document Reviewed: 07/15/2022 Elsevier Patient Education  2024 Elsevier Inc.     Signed,   Meredith Staggers, MD [facility], [facility] 10/27/23 12:54 PM

## 2023-10-27 NOTE — Patient Instructions (Addendum)
Keep follow-up with orthopedics as planned to discuss the left shoulder and left upper arm soreness.  As we discussed you could have a small tear of the biceps but I do not appreciate 1 on exam today.  Avoid use of that arm for now other than range of motion.  Could also have some more impingement or other injury within the left shoulder.  Please have x-ray performed at [facility] location below.  Avoid use of that arm until evaluated by orthopedics.  Range of motion only if tolerated.  [facility] or xray: Walk in 8:30-4:30 during weekdays, no appointment needed [address]  [place], [place] [address]   Shoulder Pain Many things can cause shoulder pain, including: An injury to the shoulder. Overuse of the shoulder. Arthritis. The source of the pain can be: Inflammation. An injury to the shoulder joint. An injury to a tendon, ligament, or bone. Follow these instructions at home: Pay attention to changes in your symptoms. Let your health care provider know about them. Follow these instructions to relieve your pain. If you have a removable sling: Wear the sling as told by your provider. Remove it only as told by your provider. Check the skin around the sling every day. Tell your provider about any concerns. Loosen the sling if your fingers tingle, become numb, or become cold. Keep the sling clean. If the sling is not waterproof: Do not let it get wet. Remove it to shower or bathe. Move your arm as little as possible, but keep your hand moving to prevent swelling. Managing pain, stiffness, and swelling  If told, put ice on the painful area. If you have a removable sling or immobilizer, remove it as told by your provider. Put ice in a plastic bag. Place a towel between your skin and the bag. Leave the ice on for 20 minutes, 2-3 times a day. If your skin turns bright red, remove the ice right away to prevent skin damage. The risk of damage is higher if you cannot feel pain, heat,  or cold. Move your fingers often to reduce stiffness and swelling. Squeeze a soft ball or a foam pad as much as possible. This helps to keep the shoulder from swelling. It also helps to strengthen the arm. General instructions Take over-the-counter and prescription medicines only as told by your provider. Exercise may help with pain management. Perform exercises if told by your provider. You may be referred to a physical therapist to help in your recovery process. Keep all follow-up visits in order to avoid any type of permanent shoulder disability or chronic pain problems. Contact a health care provider if: Your pain is not relieved with medicines. New pain develops in your arm, hand, or fingers. You loosen your sling and your arm, hand, or fingers remain tingly, numb, swollen, or painful. Get help right away if: Your arm, hand, or fingers turn white or blue. This information is not intended to replace advice given to you by your health care provider. Make sure you discuss any questions you have with your health care provider. Document Revised: 07/15/2022 Document Reviewed: 07/15/2022 Elsevier Patient Education  2024 [employer].

## 2023-10-30 ENCOUNTER — Encounter: Payer: Self-pay | Admitting: Family Medicine

## 2023-11-06 ENCOUNTER — Encounter: Payer: Self-pay | Admitting: Physician Assistant

## 2023-11-06 ENCOUNTER — Ambulatory Visit: Payer: BC Managed Care – PPO | Admitting: Physician Assistant

## 2023-11-06 ENCOUNTER — Other Ambulatory Visit: Payer: Self-pay

## 2023-11-06 ENCOUNTER — Ambulatory Visit (INDEPENDENT_AMBULATORY_CARE_PROVIDER_SITE_OTHER): Payer: BC Managed Care – PPO | Admitting: Sports Medicine

## 2023-11-06 DIAGNOSIS — M19012 Primary osteoarthritis, left shoulder: Secondary | ICD-10-CM

## 2023-11-06 DIAGNOSIS — M25512 Pain in left shoulder: Secondary | ICD-10-CM

## 2023-11-06 MED ORDER — LIDOCAINE HCL 1 % IJ SOLN
2.0000 mL | INTRAMUSCULAR | Status: AC | PRN
  Administered 2023-11-06: 2 mL

## 2023-11-06 MED ORDER — BUPIVACAINE HCL 0.25 % IJ SOLN
2.0000 mL | INTRAMUSCULAR | Status: AC | PRN
  Administered 2023-11-06: 2 mL via INTRA_ARTICULAR

## 2023-11-06 MED ORDER — METHYLPREDNISOLONE ACETATE 40 MG/ML IJ SUSP
80.0000 mg | INTRAMUSCULAR | Status: AC | PRN
  Administered 2023-11-06: 80 mg via INTRA_ARTICULAR

## 2023-11-06 NOTE — Progress Notes (Signed)
HPI: Fayrene Fearing comes in today follow-up of his left shoulder pain.  He is referred by his primary care physician Dr. Neva Seat.  Radiographs of his shoulder were obtained on 10/27/2023.  He states that his pain began a week ago on Wednesday.  He was loading up stretcher and felt pain in his arm.  He had weakness swelling and bruising about the biceps area.  Prior arthroscopy of his shoulder in June 2022 showed significant cartilage loss on the glenoid face and synovitis throughout the glenohumeral joint area.  He underwent extensive debridement.  He also had significant impingement and tendinosis of the rotator cuff. He states that his pain overall is improving.  He has right-hand-dominant.  He notes that Workmen's Comp. denied his claim for the shoulder.  Radiographs of his left shoulder are reviewed.  This shows significant AC joint arthritis.  Shoulder is well located.  Glenohumeral joint appears overall well-maintained.  There are some inferior osteophytes off the humeral head.  Review of systems: See HPI otherwise negative.   Physical exam: General Well-developed well-nourished male no acute distress mood affect appropriate. Psych: Alert and oriented x 3 Bilateral shoulders 5 5 strength external and internal rotation against resistance.  Abduction negative bilaterally.  Liftoff test negative bilaterally.  Empty can test is negative bilaterally.  Distalization of the left biceps with tenderness over the muscle belly.  No ecchymosis.   Impression: Left shoulder arthritis Acute shoulder pain with proximal biceps rupture   Plan: Will send him for an intra articular injection of the left shoulder.  Having follow-up with [place] in approximately 2 weeks see how he is doing overall.  Questions were encouraged and answered at length.

## 2023-11-06 NOTE — Progress Notes (Signed)
   Procedure Note  Patient: Blake Roberts             Date of Birth: Oct 28, 1966           MRN: [id number]             Visit Date: 11/06/2023  Procedures: Visit Diagnoses:  1. Left shoulder pain, unspecified chronicity   2. Primary osteoarthritis, left shoulder    Large Joint Inj: L glenohumeral on 11/06/2023 8:45 AM Indications: pain Details: 22 G 3.5 in needle, ultrasound-guided posterior approach Medications: 2 mL lidocaine 1 %; 2 mL bupivacaine 0.25 %; 80 mg methylPREDNISolone acetate 40 MG/ML Outcome: tolerated well, no immediate complications  US-guided glenohumeral joint injection, left shoulder After discussion on risks/benefits/indications, informed verbal consent was obtained. A timeout was then performed. The patient was positioned lying lateral recumbent on examination table. The patient's shoulder was prepped with betadine and multiple alcohol swabs and utilizing ultrasound guidance, the patient's glenohumeral joint was identified on ultrasound. Using ultrasound guidance a 22-gauge, 3.5 inch needle with a mixture of 2:2:2 cc's lidocaine:bupivicaine:depomedrol was directed from a lateral to medial direction via in-plane technique into the glenohumeral joint with visualization of appropriate spread of injectate into the joint. Patient tolerated the procedure well without immediate complications.      Procedure, treatment alternatives, risks and benefits explained, specific risks discussed. Consent was given by the patient. Immediately prior to procedure a time out was called to verify the correct patient, procedure, equipment, support staff and site/side marked as required. Patient was prepped and draped in the usual sterile fashion.    - I evaluated the patient about 5 minutes post-injection and he had improvement in pain and range of motion - note provided for work - follow-up with Rexene Edison as indicated; I am happy to see them as needed  Madelyn Brunner, DO Primary Care  Sports Medicine Physician  [facility] - Orthopedics  This note was dictated using Dragon naturally speaking software and may contain errors in syntax, spelling, or content which have not been identified prior to signing this note.

## 2023-11-16 ENCOUNTER — Encounter: Payer: BC Managed Care – PPO | Admitting: Family Medicine

## 2023-11-20 ENCOUNTER — Encounter: Payer: Self-pay | Admitting: Physician Assistant

## 2023-11-20 ENCOUNTER — Other Ambulatory Visit (INDEPENDENT_AMBULATORY_CARE_PROVIDER_SITE_OTHER): Payer: BC Managed Care – PPO

## 2023-11-20 ENCOUNTER — Ambulatory Visit: Payer: BC Managed Care – PPO | Admitting: Physician Assistant

## 2023-11-20 DIAGNOSIS — G8929 Other chronic pain: Secondary | ICD-10-CM

## 2023-11-20 DIAGNOSIS — M19012 Primary osteoarthritis, left shoulder: Secondary | ICD-10-CM

## 2023-11-20 DIAGNOSIS — M79652 Pain in left thigh: Secondary | ICD-10-CM | POA: Diagnosis not present

## 2023-11-20 DIAGNOSIS — M5442 Lumbago with sciatica, left side: Secondary | ICD-10-CM | POA: Diagnosis not present

## 2023-11-20 MED ORDER — CYCLOBENZAPRINE HCL 10 MG PO TABS: 10.0000 mg | ORAL_TABLET | Freq: Every day | ORAL | 1 refills | Status: DC

## 2023-11-20 MED ORDER — METHYLPREDNISOLONE 4 MG PO TABS: ORAL_TABLET | ORAL | 0 refills | Status: DC

## 2023-11-20 NOTE — Progress Notes (Signed)
Office Visit Note   Patient: Blake Roberts           Date of Birth: Apr 07, 1966           MRN: [id number] Visit Date: 11/20/2023              Requested by: Shade Flood, MD [address] [place],  [place] [address] PCP: Shade Flood, MD   Assessment & Plan: Visit Diagnoses:  1. Acute pain of left thigh   2. Primary osteoarthritis, left shoulder   3. Chronic left-sided low back pain with left-sided sciatica     Plan: We will place him out of work for the next 3 days.  Will also send him to formal physical therapy for his lumbar spine.  They will work on core strengthening back exercises, stretching and include modalities and home exercise program.  He is placed on Medrol Dosepak and Flexeril at night.  See him back in 6 weeks see how he is doing overall.  He will return sooner if he develops any radicular symptoms, waking back pain, bowel or bladder incontinence or saddle anesthesia like symptoms.  Follow-Up Instructions: Return in about 6 weeks (around 01/01/2024).   Orders:  Orders Placed This Encounter  Procedures   XR Lumbar Spine 2-3 Views   Ambulatory referral to Physical Therapy   Meds ordered this encounter  Medications   methylPREDNISolone (MEDROL) 4 MG tablet    Sig: Take as directed    Dispense:  21 tablet    Refill:  0   cyclobenzaprine (FLEXERIL) 10 MG tablet    Sig: Take 1 tablet (10 mg total) by mouth at bedtime.    Dispense:  30 tablet    Refill:  1      Procedures: No procedures performed   Clinical Data: No additional findings.   Subjective: Chief Complaint  Patient presents with   Left Shoulder - Follow-up    HPI [name] comes in today for follow-up of his left shoulder.  He underwent an intra-articular injection left shoulder on 11/06/2023 by Dr. Shon Baton and states that since the injection left shoulder is doing well.  Unfortunately has developed left anterior thigh pain.  Radiates down to the knee.  No numbness tingling.  Denies  any back pain.  History of lumbar spine stenosis both spinal and foraminal by MRI in the past with history of epidural steroid injections.  He denies any groin pain.  States the pain is worse whenever he stands up straight.  Denies any back pain.  No new injury.  He has been taking ibuprofen for the pain.  Review of Systems  Constitutional:  Negative for chills and fever.  Musculoskeletal:  Positive for myalgias. Negative for back pain.  Neurological:  Negative for numbness.     Objective: Vital Signs: There were no vitals taken for this visit.  Physical Exam Constitutional:      Appearance: He is not ill-appearing or diaphoretic.  Pulmonary:     Effort: Pulmonary effort is normal.  Neurological:     Mental Status: He is alert and oriented to person, place, and time.  Psychiatric:        Mood and Affect: Mood normal.     Ortho Exam Lower extremities 5 out of 5 strength throughout against resistance.  Negative straight leg raise bilaterally.  Tight hamstrings bilaterally.  He comes within 2 inches of being able to touch his toes bilaterally.  Good extension without pain. Bilateral hips:  Good range of motion of both hips without significant pain.  Nontender over the trochanteric region both hips.  Nontender over the anterior thighs bilaterally. Bilateral knees good range of motion.  Patellofemoral crepitus with both knees.  Left knee no instability valgus varus stress. Specialty Comments:  No specialty comments available.  Imaging: XR Lumbar Spine 2-3 Views  Result Date: 11/20/2023 Lumbar spine: No acute fracture. Loss of lordotic curvature. L3 compression fracture of unknown age was not present on 2020 films.  Significant vertebral anterior osteophytes throughout the lumbar spine loss of disc space at multiple levels.  Scoliosis.  No spondylolisthesis    PMFS History: Patient Active Problem List   Diagnosis Date Noted   Unilateral primary osteoarthritis, right hip 02/27/2023    Hyperlipidemia 05/10/2017   Erectile dysfunction 05/10/2017   Memory difficulty 09/26/2016   Hemochromatosis 07/11/2016   Episodic lightheadedness 12/25/2015   Anemia, unspecified 12/25/2015   Chronic left-sided low back pain with sciatica 07/15/2015   Essential hypertension 02/15/2015   Rotator cuff tear 06/09/2014   GERD (gastroesophageal reflux disease) 05/13/2014   Past Medical History:  Diagnosis Date   Acute meniscal tear of knee RIGHT KNEE   Anxiety    Arthritis RIGHT KNEE   Bulging lumbar disc L5 - S1   Depression    Hemochromatosis    Hyperlipidemia    Hypertension     Family History  Problem Relation Age of Onset   Breast cancer Mother    Lung cancer Mother    Diabetes Father        adopted father; no history of biological father   Heart disease Father    Hyperlipidemia Father    Hypertension Father    Kidney disease Father    Heart Problems Brother        has pacemaker also handicapped   Pancreatic cancer Maternal Uncle        age early 72's   Colon cancer Neg Hx     Past Surgical History:  Procedure Laterality Date   KNEE ARTHROSCOPY  AGE 25 (APPROX)   RIGHT KNEE   KNEE ARTHROSCOPY  07/13/2012   Procedure: ARTHROSCOPY KNEE;  Surgeon: Drucilla Schmidt, MD;  Location: [facility];  Service: Orthopedics;  Laterality: Right;  WITH PARTIAL MEDIAL MENISECTOMY AND REMOVAL OF LOOSE BODIES   torn rotator cuff     Social History   Occupational History   Occupation: paramedic  Tobacco Use   Smoking status: Never   Smokeless tobacco: Never  Substance and Sexual Activity   Alcohol use: Yes    Comment: 4 beers a month,states heavy beer drinker in younger years   Drug use: No   Sexual activity: Yes

## 2023-11-22 ENCOUNTER — Other Ambulatory Visit: Payer: Self-pay

## 2023-11-22 ENCOUNTER — Encounter: Payer: Self-pay | Admitting: Physical Therapy

## 2023-11-22 ENCOUNTER — Ambulatory Visit: Payer: BC Managed Care – PPO | Admitting: Physical Therapy

## 2023-11-22 DIAGNOSIS — M6281 Muscle weakness (generalized): Secondary | ICD-10-CM | POA: Diagnosis not present

## 2023-11-22 DIAGNOSIS — M5459 Other low back pain: Secondary | ICD-10-CM | POA: Diagnosis not present

## 2023-11-22 DIAGNOSIS — M79605 Pain in left leg: Secondary | ICD-10-CM

## 2023-11-22 NOTE — Therapy (Signed)
OUTPATIENT PHYSICAL THERAPY EVALUATION   Patient Name: Blake Roberts MRN: [id number] DOB:1966/08/11, 57 y.o., male Today's Date: 11/22/2023  END OF SESSION:  PT End of Session - 11/22/23 0801     Visit Number 1    Number of Visits 6    Date for PT Re-Evaluation 01/03/24    PT Start Time 0805    PT Stop Time 0834    PT Time Calculation (min) 29 min    Activity Tolerance Patient tolerated treatment well    Behavior During Therapy WFL for tasks assessed/performed             Past Medical History:  Diagnosis Date   Acute meniscal tear of knee RIGHT KNEE   Anxiety    Arthritis RIGHT KNEE   Bulging lumbar disc L5 - S1   Depression    Hemochromatosis    Hyperlipidemia    Hypertension    Past Surgical History:  Procedure Laterality Date   KNEE ARTHROSCOPY  AGE 37 (APPROX)   RIGHT KNEE   KNEE ARTHROSCOPY  07/13/2012   Procedure: ARTHROSCOPY KNEE;  Surgeon: Drucilla Schmidt, MD;  Location: [facility];  Service: Orthopedics;  Laterality: Right;  WITH PARTIAL MEDIAL MENISECTOMY AND REMOVAL OF LOOSE BODIES   torn rotator cuff     Patient Active Problem List   Diagnosis Date Noted   Unilateral primary osteoarthritis, right hip 02/27/2023   Hyperlipidemia 05/10/2017   Erectile dysfunction 05/10/2017   Memory difficulty 09/26/2016   Hemochromatosis 07/11/2016   Episodic lightheadedness 12/25/2015   Anemia, unspecified 12/25/2015   Chronic left-sided low back pain with sciatica 07/15/2015   Essential hypertension 02/15/2015   Rotator cuff tear 06/09/2014   GERD (gastroesophageal reflux disease) 05/13/2014    PCP: Shade Flood, MD  REFERRING PROVIDER: Kirtland Bouchard, PA-C  REFERRING DIAG: [phone] (ICD-10-CM) - Chronic left-sided low back pain with left-sided sciatica  Rationale for Evaluation and Treatment: Rehabilitation  THERAPY DIAG:  Other low back pain - Plan: PT plan of care cert/re-cert  Muscle weakness (generalized) - Plan:  PT plan of care cert/re-cert  Pain in left leg - Plan: PT plan of care cert/re-cert  ONSET DATE: 11/15/23   SUBJECTIVE:                                                                                                                                                                                           SUBJECTIVE STATEMENT: Pt reports increased pain in Lt anterior thigh when standing and walking.  Sudden onset about a week ago without any known injury.  Does report a partial biceps tear in LUE  about 3-4 weeks ago but unsure if this is related.  PERTINENT HISTORY:  Depression, hyperlipidemia, HTN, anxiety, OA  PAIN:  Are you having pain? Yes: NPRS scale: 0 currently, up to 7/10 Pain location: Lt anterior thigh Pain description: tightness, burning, spasm Aggravating factors: standing, walking Relieving factors: sitting, lying down, ?maybe prednisone  PRECAUTIONS:  None  RED FLAGS: None   WEIGHT BEARING RESTRICTIONS:  No  FALLS:  Has patient fallen in last 6 months? No  LIVING ENVIRONMENT: Lives with: lives alone Lives in: House/apartment Stairs: Yes: External: 4 steps; none   OCCUPATION:  Paramedic  PLOF:  Independent and Leisure: exercise - elliptical and weights, reading  PATIENT GOALS:  Improve pain   OBJECTIVE:   DIAGNOSTIC FINDINGS: X-rays: Lumbar spine: No acute fracture. Loss of lordotic curvature. L3 compression fracture of unknown age was not present on 2020 films. Significant vertebral anterior osteophytes throughout the lumbar spine loss of disc space at multiple levels. Scoliosis. No spondylolisthesis  Old MRI spinal and foraminal stenosis of lumbar spine  PATIENT SURVEYS:  11/22/23 FOTO 44 (predicted 72)  COGNITIVE STATUS: Within functional limits for tasks assessed   SENSATION: WFL  POSTURE:  rounded shoulders and forward head  GAIT: 11/22/23 Comments: Lt lateral shift, slight forward lean  PALPATION: 11/22/23 no pain or  tenderness to Lt quad, glutes, QL; Gr1 CPA mobs to T8-L5 WNL without pain  LUMBAR ROM:   Active  A/PROM  eval  Flexion WNL  Extension To neutral with increase in thigh pain  Right Quadrant WNL  Left Quadrant WNL   (Blank rows = not tested)      LOWER EXTREMITY MMT:    MMT Right eval Left eval  Hip flexion 5/5 5/5  Hip extension 4/5 4/5  Hip abduction 5/5 4/5  Hip adduction    Hip internal rotation    Hip external rotation    Knee flexion 5/5 5/5  Knee extension 5/5 5/5  Ankle dorsiflexion    Ankle plantarflexion    Ankle inversion    Ankle eversion     (Blank rows = not tested)    SPECIAL TESTS:  11/22/23 Lumbar Slump test: Negative    TREATMENT:                                                                                                                              DATE:  11/22/23 See HEP - demonstrated with trial reps performed PRN for comprehension; Lt lateral shift reproduced thigh pain but no change with repeated movements - still recommended he continue and will reassess at next visit    PATIENT EDUCATION:  Education details: HEP Person educated: Patient Education method: Explanation, Demonstration, and Handouts Education comprehension: verbalized understanding, returned demonstration, and needs further education  HOME EXERCISE PROGRAM: Access Code: [email or web address] URL: [email or web address] Date: 11/22/2023 Prepared by: Moshe Cipro  Exercises - Left Standing Lateral Shift Correction at Wall - Repetitions  - 3-5 x daily - 7 x  weekly - 1 sets - 10 reps - 10 sec hold - Prone Quadriceps Stretch with Strap  - 2-3 x daily - 7 x weekly - 1 sets - 10 reps - 10 sec hold - Hooklying Single Knee to Chest  - 2-3 x daily - 7 x weekly - 1 sets - 3 reps - 30 sec hold - Supine Piriformis Stretch with Foot on Ground  - 2-3 x daily - 7 x weekly - 1 sets - 3 reps - 30 sec hold   ASSESSMENT:  CLINICAL IMPRESSION: Patient is a 57 y.o. male who  was seen today for physical therapy evaluation and treatment for Lt sided LBP with radiculopathy. He demonstrates decreased strength and ROM as well as radicular pain affecting functional mobility.  He will benefit from PT to address deficits listed.     OBJECTIVE IMPAIRMENTS: Abnormal gait, decreased mobility, difficulty walking, decreased ROM, decreased strength, postural dysfunction, and pain.   ACTIVITY LIMITATIONS: carrying, lifting, standing, squatting, and locomotion level  PARTICIPATION LIMITATIONS: meal prep, cleaning, laundry, community activity, and occupation  PERSONAL FACTORS: 3+ comorbidities: Depression, hyperlipidemia, HTN, anxiety, OA  are also affecting patient's functional outcome.   REHAB POTENTIAL: Good  CLINICAL DECISION MAKING: Evolving/moderate complexity  EVALUATION COMPLEXITY: Moderate   GOALS: Goals reviewed with patient? Yes  SHORT TERM GOALS: Target date: 12/13/2023  Independent with initial HEP Goal status: INITIAL    LONG TERM GOALS: Target date: 01/03/2024   Independent with final HEP Goal status: INITIAL  2.  FOTO score improved to 72 Goal status: INITIAL  3.  LLE strength improved to 4/5 for improved function and mobility Goal status: INIITAL  4.  Report pain < 3/10 with standing and walking for improved function Goal status: INITIAL  5.  Report centralization of symptoms for improved function Goal status: INITIAL   PLAN:  PT FREQUENCY: 1x/week  PT DURATION: 6 weeks  PLANNED INTERVENTIONS: 97164- PT Re-evaluation, 97110-Therapeutic exercises, 97530- Therapeutic activity, [id number]- Neuromuscular re-education, 97535- Self Care, 44034- Manual therapy, [id number]- Gait training, [phone]- Aquatic Therapy, 97014- Electrical stimulation (unattended), [phone]- Traction (mechanical), Patient/Family education, Taping, Dry Needling, Spinal manipulation, Spinal mobilization, Cryotherapy, and Moist heat.  PLAN FOR NEXT SESSION: review HEP, consider  manip?, trial traction   NEXT MD VISIT: PRN   Clarita Crane, PT, DPT 11/22/23 8:40 AM

## 2023-12-04 ENCOUNTER — Encounter: Payer: Self-pay | Admitting: Rehabilitative and Restorative Service Providers"

## 2023-12-04 ENCOUNTER — Ambulatory Visit: Payer: BC Managed Care – PPO | Admitting: Rehabilitative and Restorative Service Providers"

## 2023-12-04 DIAGNOSIS — M5459 Other low back pain: Secondary | ICD-10-CM

## 2023-12-04 DIAGNOSIS — M79605 Pain in left leg: Secondary | ICD-10-CM | POA: Diagnosis not present

## 2023-12-04 DIAGNOSIS — M6281 Muscle weakness (generalized): Secondary | ICD-10-CM

## 2023-12-04 NOTE — Therapy (Addendum)
OUTPATIENT PHYSICAL THERAPY TREATMENT   Patient Name: Blake Roberts MRN: [id number] DOB:1966/08/14, 57 y.o., male Today's Date: 12/04/2023  END OF SESSION:  PT End of Session - 12/04/23 0808     Visit Number 2    Number of Visits 6    Date for PT Re-Evaluation 01/03/24    PT Start Time 0800    PT Stop Time 0839    PT Time Calculation (min) 39 min    Activity Tolerance Patient limited by pain    Behavior During Therapy WFL for tasks assessed/performed              Past Medical History:  Diagnosis Date   Acute meniscal tear of knee RIGHT KNEE   Anxiety    Arthritis RIGHT KNEE   Bulging lumbar disc L5 - S1   Depression    Hemochromatosis    Hyperlipidemia    Hypertension    Past Surgical History:  Procedure Laterality Date   KNEE ARTHROSCOPY  AGE 36 (APPROX)   RIGHT KNEE   KNEE ARTHROSCOPY  07/13/2012   Procedure: ARTHROSCOPY KNEE;  Surgeon: Drucilla Schmidt, MD;  [facility];  Service: Orthopedics;  Laterality: Right;  WITH PARTIAL MEDIAL MENISECTOMY AND REMOVAL OF LOOSE BODIES   torn rotator cuff     Patient Active Problem List   Diagnosis Date Noted   Unilateral primary osteoarthritis, right hip 02/27/2023   Hyperlipidemia 05/10/2017   Erectile dysfunction 05/10/2017   Memory difficulty 09/26/2016   Hemochromatosis 07/11/2016   Episodic lightheadedness 12/25/2015   Anemia, unspecified 12/25/2015   Chronic left-sided low back pain with sciatica 07/15/2015   Essential hypertension 02/15/2015   Rotator cuff tear 06/09/2014   GERD (gastroesophageal reflux disease) 05/13/2014    PCP: Shade Flood, MD  REFERRING PROVIDER: Kirtland Bouchard, PA-C  REFERRING DIAG: [phone] (ICD-10-CM) - Chronic left-sided low back pain with left-sided sciatica  Rationale for Evaluation and Treatment: Rehabilitation  THERAPY DIAG:  Other low back pain  Muscle weakness (generalized)  Pain in left leg  ONSET DATE:  11/15/23   SUBJECTIVE:                                                                                                                                                                                           SUBJECTIVE STATEMENT: Pt indicated pain noted in standing with more pain with straighter standing in Lt anterior quad "cramp like feel".  Strong pain.  Pt indicated feeling some worse since last time.   PERTINENT HISTORY:  Depression, hyperlipidemia, HTN, anxiety, OA  PAIN:  NPRS scale: 0 currently, up to 7/10 Pain  location: Lt anterior thigh Pain description: tightness, burning, spasm Aggravating factors: standing, walking Relieving factors: sitting, lying down, ?maybe prednisone  PRECAUTIONS:  None  RED FLAGS: None   WEIGHT BEARING RESTRICTIONS:  No  FALLS:  Has patient fallen in last 6 months? No  LIVING ENVIRONMENT: Lives with: lives alone Lives in: House/apartment Stairs: Yes: External: 4 steps; none   OCCUPATION:  Paramedic  PLOF:  Independent and Leisure: exercise - elliptical and weights, reading  PATIENT GOALS:  Improve pain   OBJECTIVE:   DIAGNOSTIC FINDINGS: X-rays: Lumbar spine: No acute fracture. Loss of lordotic curvature. L3 compression fracture of unknown age was not present on 2020 films. Significant vertebral anterior osteophytes throughout the lumbar spine loss of disc space at multiple levels. Scoliosis. No spondylolisthesis  Old MRI spinal and foraminal stenosis of lumbar spine  PATIENT SURVEYS:  11/22/23 FOTO 44 (predicted 72)  COGNITIVE STATUS: Within functional limits for tasks assessed   SENSATION: WFL  POSTURE:  rounded shoulders and forward head  GAIT: 11/22/23 Comments: Lt lateral shift, slight forward lean  PALPATION: 11/22/23 no pain or tenderness to Lt quad, glutes, QL; Gr1 CPA mobs to T8-L5 WNL without pain  LUMBAR ROM:   Active  A/PROM  eval 12/04/2023  Flexion WNL No complaints  Extension To  neutral with increase in thigh pain To neutral  Right Quadrant WNL   Left Quadrant WNL    (Blank rows = not tested)      LOWER EXTREMITY MMT:    MMT Right eval Left eval  Hip flexion 5/5 5/5  Hip extension 4/5 4/5  Hip abduction 5/5 4/5  Hip adduction    Hip internal rotation    Hip external rotation    Knee flexion 5/5 5/5  Knee extension 5/5 5/5  Ankle dorsiflexion    Ankle plantarflexion    Ankle inversion    Ankle eversion     (Blank rows = not tested)    SPECIAL TESTS:  12/04/2023: (+) Thomas test Lt for hip flexor and quad tightness, concordant symptoms noted.   11/22/23 Lumbar Slump test: Negative                  TREATMENT:                                                                          DATE:  12/04/2023 Manual Percussive device Lt quad musculature in thigh and anterior hip  Therex: Supine thomas stretch Lt leg 30 sec x 3 (instruction for home c cues) Supine bridge x 15  Standing lumbar extension AROM attempts 2 x 5 Nustep Lvl 6 10 mins  Adjustments to HEP with verbal cues and education on stretching.     TREATMENT:                                                                          DATE:  11/22/23 See HEP - demonstrated with trial reps performed PRN for  comprehension; Lt lateral shift reproduced thigh pain but no change with repeated movements - still recommended he continue and will reassess at next visit    PATIENT EDUCATION:  Education details: HEP Person educated: Patient Education method: Explanation, Demonstration, and Handouts Education comprehension: verbalized understanding, returned demonstration, and needs further education  HOME EXERCISE PROGRAM: Access Code: [email or web address] URL: [email or web address] Date: 12/04/2023 Prepared by: Chyrel Masson  Exercises - Left Standing Lateral Shift Correction at Wall - Repetitions  - 3-5 x daily - 7 x weekly - 1 sets - 10 reps - 10 sec hold - Prone Quadriceps Stretch with  Strap  - 2-3 x daily - 7 x weekly - 1 sets - 10 reps - 10 sec hold - Hooklying Single Knee to Chest  - 2-3 x daily - 7 x weekly - 1 sets - 3 reps - 30 sec hold - Supine Piriformis Stretch with Foot on Ground  - 2-3 x daily - 7 x weekly - 1 sets - 3 reps - 30 sec hold - Thomas Stretch on Table  - 2-3 x daily - 7 x weekly - 1 sets - 3-5 reps - 20-30 hold   ASSESSMENT:  CLINICAL IMPRESSION: Presentation of inability to maintain upright standing posture in neutral noted due to Lt thigh pain complaints.  Description matched muscle tightness/spasm feel. Check of Lt thomas test showed concordant symptoms in thigh with tightness.  Continued emphasis on improved mobility from Lt quad/hip flexors to see if that improved posture going forward and tolerance to standing.     OBJECTIVE IMPAIRMENTS: Abnormal gait, decreased mobility, difficulty walking, decreased ROM, decreased strength, postural dysfunction, and pain.   ACTIVITY LIMITATIONS: carrying, lifting, standing, squatting, and locomotion level  PARTICIPATION LIMITATIONS: meal prep, cleaning, laundry, community activity, and occupation  PERSONAL FACTORS: 3+ comorbidities: Depression, hyperlipidemia, HTN, anxiety, OA  are also affecting patient's functional outcome.   REHAB POTENTIAL: Good  CLINICAL DECISION MAKING: Evolving/moderate complexity  EVALUATION COMPLEXITY: Moderate   GOALS: Goals reviewed with patient? Yes  SHORT TERM GOALS: Target date: 12/13/2023  Independent with initial HEP Goal status: on going 12/04/2023    LONG TERM GOALS: Target date: 01/03/2024   Independent with final HEP Goal status: INITIAL  2.  FOTO score improved to 72 Goal status: INITIAL  3.  LLE strength improved to 4/5 for improved function and mobility Goal status: INIITAL  4.  Report pain < 3/10 with standing and walking for improved function Goal status: INITIAL  5.  Report centralization of symptoms for improved function Goal status:  INITIAL   PLAN:  PT FREQUENCY: 1x/week  PT DURATION: 6 weeks  PLANNED INTERVENTIONS: 97164- PT Re-evaluation, 97110-Therapeutic exercises, 97530- Therapeutic activity, [id number]- Neuromuscular re-education, 97535- Self Care, 54098- Manual therapy, [id number]- Gait training, [phone]- Aquatic Therapy, 97014- Electrical stimulation (unattended), [phone]- Traction (mechanical), Patient/Family education, Taping, Dry Needling, Spinal manipulation, Spinal mobilization, Cryotherapy, and Moist heat.  PLAN FOR NEXT SESSION: Recheck quad/hip flexor tightness.  Continue possible STM /percussive device use to loosen up tightness.  Improved mobility from lumbar and hips.    NEXT MD VISIT: PRN   Chyrel Masson, PT, DPT, OCS, ATC 12/04/23  8:40 AM  w

## 2023-12-11 ENCOUNTER — Encounter: Payer: Self-pay | Admitting: Physical Therapy

## 2023-12-11 ENCOUNTER — Ambulatory Visit: Payer: BC Managed Care – PPO | Admitting: Physical Therapy

## 2023-12-11 DIAGNOSIS — M79605 Pain in left leg: Secondary | ICD-10-CM

## 2023-12-11 DIAGNOSIS — M25552 Pain in left hip: Secondary | ICD-10-CM | POA: Diagnosis not present

## 2023-12-11 DIAGNOSIS — M5459 Other low back pain: Secondary | ICD-10-CM

## 2023-12-11 DIAGNOSIS — M6281 Muscle weakness (generalized): Secondary | ICD-10-CM | POA: Diagnosis not present

## 2023-12-11 NOTE — Therapy (Signed)
OUTPATIENT PHYSICAL THERAPY TREATMENT   Patient Name: Blake Roberts MRN: [id number] DOB:1965-12-31, 57 y.o., male Today's Date: 12/11/2023  END OF SESSION:  PT End of Session - 12/11/23 0802     Visit Number 3    Number of Visits 6    Date for PT Re-Evaluation 01/03/24    PT Start Time 0800    PT Stop Time 0839    PT Time Calculation (min) 39 min    Activity Tolerance Patient limited by pain    Behavior During Therapy WFL for tasks assessed/performed               Past Medical History:  Diagnosis Date   Acute meniscal tear of knee RIGHT KNEE   Anxiety    Arthritis RIGHT KNEE   Bulging lumbar disc L5 - S1   Depression    Hemochromatosis    Hyperlipidemia    Hypertension    Past Surgical History:  Procedure Laterality Date   KNEE ARTHROSCOPY  AGE 14 (APPROX)   RIGHT KNEE   KNEE ARTHROSCOPY  07/13/2012   Procedure: ARTHROSCOPY KNEE;  Surgeon: Drucilla Schmidt, MD;  Location: [facility];  Service: Orthopedics;  Laterality: Right;  WITH PARTIAL MEDIAL MENISECTOMY AND REMOVAL OF LOOSE BODIES   torn rotator cuff     Patient Active Problem List   Diagnosis Date Noted   Unilateral primary osteoarthritis, right hip 02/27/2023   Hyperlipidemia 05/10/2017   Erectile dysfunction 05/10/2017   Memory difficulty 09/26/2016   Hemochromatosis 07/11/2016   Episodic lightheadedness 12/25/2015   Anemia, unspecified 12/25/2015   Chronic left-sided low back pain with sciatica 07/15/2015   Essential hypertension 02/15/2015   Rotator cuff tear 06/09/2014   GERD (gastroesophageal reflux disease) 05/13/2014    PCP: Shade Flood, MD  REFERRING PROVIDER: Kirtland Bouchard, PA-C  REFERRING DIAG: [phone] (ICD-10-CM) - Chronic left-sided low back pain with left-sided sciatica  Rationale for Evaluation and Treatment: Rehabilitation  THERAPY DIAG:  Other low back pain  Muscle weakness (generalized)  Pain in left leg  Pain in left hip  ONSET  DATE: 11/15/23   SUBJECTIVE:                                                                                                                                                                                           SUBJECTIVE STATEMENT: Currently no pain; feels a little better.  Shooting pain seems to have reduced in frequency.  PERTINENT HISTORY:  Depression, hyperlipidemia, HTN, anxiety, OA  PAIN:  NPRS scale: 0 currently, up to 7/10 Pain location: Lt anterior thigh Pain description: tightness, burning, spasm Aggravating  factors: standing, walking Relieving factors: sitting, lying down, ?maybe prednisone  PRECAUTIONS:  None  RED FLAGS: None   WEIGHT BEARING RESTRICTIONS:  No  FALLS:  Has patient fallen in last 6 months? No  LIVING ENVIRONMENT: Lives with: lives alone Lives in: House/apartment Stairs: Yes: External: 4 steps; none   OCCUPATION:  Paramedic  PLOF:  Independent and Leisure: exercise - elliptical and weights, reading  PATIENT GOALS:  Improve pain   OBJECTIVE:   DIAGNOSTIC FINDINGS: X-rays: Lumbar spine: No acute fracture. Loss of lordotic curvature. L3 compression fracture of unknown age was not present on 2020 films. Significant vertebral anterior osteophytes throughout the lumbar spine loss of disc space at multiple levels. Scoliosis. No spondylolisthesis  Old MRI spinal and foraminal stenosis of lumbar spine  PATIENT SURVEYS:  11/22/23 FOTO 44 (predicted 72)  COGNITIVE STATUS: Within functional limits for tasks assessed   SENSATION: WFL  POSTURE:  rounded shoulders and forward head  GAIT: 11/22/23 Comments: Lt lateral shift, slight forward lean  PALPATION: 11/22/23 no pain or tenderness to Lt quad, glutes, QL; Gr1 CPA mobs to T8-L5 WNL without pain  LUMBAR ROM:   Active  A/PROM  eval 12/04/2023  Flexion WNL No complaints  Extension To neutral with increase in thigh pain To neutral  Right Quadrant WNL   Left Quadrant WNL     (Blank rows = not tested)      LOWER EXTREMITY MMT:    MMT Right eval Left eval  Hip flexion 5/5 5/5  Hip extension 4/5 4/5  Hip abduction 5/5 4/5  Hip adduction    Hip internal rotation    Hip external rotation    Knee flexion 5/5 5/5  Knee extension 5/5 5/5  Ankle dorsiflexion    Ankle plantarflexion    Ankle inversion    Ankle eversion     (Blank rows = not tested)    SPECIAL TESTS:  12/04/2023: (+) Thomas test Lt for hip flexor and quad tightness, concordant symptoms noted.   11/22/23 Lumbar Slump test: Negative                  TREATMENT 12/11/23 TherEx NuStep L6 x 10 min Supine Lt hip flexor stretch 3x30 sec - discussed using sheet for added quad stretch if needed Use of tennis ball for self release to quad and hip flexors Standing lumbar extension 10 x 10 sec hold (to about neutral) Standing hip extension x10 bil (Lt quad spasm when doing RLE resolved once weight bearing stopped) Prone lying for hip flexor stretch and discussed prone on elbows progression x 2 min   12/04/23 Manual Percussive device Lt quad musculature in thigh and anterior hip  Therex: Supine thomas stretch Lt leg 30 sec x 3 (instruction for home c cues) Supine bridge x 15  Standing lumbar extension AROM attempts 2 x 5 Nustep Lvl 6 10 mins  Adjustments to HEP with verbal cues and education on stretching.    11/22/23 See HEP - demonstrated with trial reps performed PRN for comprehension; Lt lateral shift reproduced thigh pain but no change with repeated movements - still recommended he continue and will reassess at next visit    PATIENT EDUCATION:  Education details: HEP Person educated: Patient Education method: Explanation, Demonstration, and Handouts Education comprehension: verbalized understanding, returned demonstration, and needs further education  HOME EXERCISE PROGRAM: Access Code: [email or web address] URL: [email or web address] Date: 12/04/2023 Prepared by:  Chyrel Masson  Exercises - Left Standing Lateral Shift Correction at Wall - Repetitions  -  3-5 x daily - 7 x weekly - 1 sets - 10 reps - 10 sec hold - Prone Quadriceps Stretch with Strap  - 2-3 x daily - 7 x weekly - 1 sets - 10 reps - 10 sec hold - Hooklying Single Knee to Chest  - 2-3 x daily - 7 x weekly - 1 sets - 3 reps - 30 sec hold - Supine Piriformis Stretch with Foot on Ground  - 2-3 x daily - 7 x weekly - 1 sets - 3 reps - 30 sec hold - Thomas Stretch on Table  - 2-3 x daily - 7 x weekly - 1 sets - 3-5 reps - 20-30 hold   ASSESSMENT:  CLINICAL IMPRESSION: Pt continues to have increased Lt quad spasms in weightbearing which resolve quickly with seated rest.  Still having episodes of pain but frequency has decreased.  Discussed slow return to strengthening as able.  Will continue to benefit from PT to maximize function.   OBJECTIVE IMPAIRMENTS: Abnormal gait, decreased mobility, difficulty walking, decreased ROM, decreased strength, postural dysfunction, and pain.   ACTIVITY LIMITATIONS: carrying, lifting, standing, squatting, and locomotion level  PARTICIPATION LIMITATIONS: meal prep, cleaning, laundry, community activity, and occupation  PERSONAL FACTORS: 3+ comorbidities: Depression, hyperlipidemia, HTN, anxiety, OA  are also affecting patient's functional outcome.   REHAB POTENTIAL: Good  CLINICAL DECISION MAKING: Evolving/moderate complexity  EVALUATION COMPLEXITY: Moderate   GOALS: Goals reviewed with patient? Yes  SHORT TERM GOALS: Target date: 12/13/2023  Independent with initial HEP Goal status: on going 12/04/2023    LONG TERM GOALS: Target date: 01/03/2024   Independent with final HEP Goal status: INITIAL  2.  FOTO score improved to 72 Goal status: INITIAL  3.  LLE strength improved to 4/5 for improved function and mobility Goal status: INIITAL  4.  Report pain < 3/10 with standing and walking for improved function Goal status: INITIAL  5.   Report centralization of symptoms for improved function Goal status: INITIAL   PLAN:  PT FREQUENCY: 1x/week  PT DURATION: 6 weeks  PLANNED INTERVENTIONS: 97164- PT Re-evaluation, 97110-Therapeutic exercises, 97530- Therapeutic activity, [id number]- Neuromuscular re-education, 97535- Self Care, [id number]- Manual therapy, [id number]- Gait training, [phone]- Aquatic Therapy, 97014- Electrical stimulation (unattended), [phone]- Traction (mechanical), Patient/Family education, Taping, Dry Needling, Spinal manipulation, Spinal mobilization, Cryotherapy, and Moist heat.  PLAN FOR NEXT SESSION: work on neutral/extension based exercise as able;  Continue possible STM /percussive device use to loosen up tightness.  Improved mobility from lumbar and hips.    NEXT MD VISIT: PRN   Clarita Crane, PT, DPT 12/11/23 8:44 AM

## 2023-12-18 ENCOUNTER — Encounter: Payer: Self-pay | Admitting: Physical Therapy

## 2023-12-18 ENCOUNTER — Ambulatory Visit: Payer: BC Managed Care – PPO | Admitting: Physical Therapy

## 2023-12-18 DIAGNOSIS — M79605 Pain in left leg: Secondary | ICD-10-CM | POA: Diagnosis not present

## 2023-12-18 DIAGNOSIS — M25552 Pain in left hip: Secondary | ICD-10-CM

## 2023-12-18 DIAGNOSIS — M5459 Other low back pain: Secondary | ICD-10-CM

## 2023-12-18 DIAGNOSIS — M6281 Muscle weakness (generalized): Secondary | ICD-10-CM | POA: Diagnosis not present

## 2023-12-18 NOTE — Therapy (Addendum)
 OUTPATIENT PHYSICAL THERAPY TREATMENT DISCHARGE SUMMARY   Patient Name: Blake Roberts MRN: [id number] DOB:07-18-1966, 57 y.o., male Today's Date: 12/18/2023  END OF SESSION:  PT End of Session - 12/18/23 0758     Visit Number 4    Number of Visits 6    Date for PT Re-Evaluation 01/03/24    PT Start Time 0800    PT Stop Time 0838    PT Time Calculation (min) 38 min    Activity Tolerance Patient limited by pain    Behavior During Therapy WFL for tasks assessed/performed                Past Medical History:  Diagnosis Date   Acute meniscal tear of knee RIGHT KNEE   Anxiety    Arthritis RIGHT KNEE   Bulging lumbar disc L5 - S1   Depression    Hemochromatosis    Hyperlipidemia    Hypertension    Past Surgical History:  Procedure Laterality Date   KNEE ARTHROSCOPY  AGE 26 (APPROX)   RIGHT KNEE   KNEE ARTHROSCOPY  07/13/2012   Procedure: ARTHROSCOPY KNEE;  Surgeon: Drucilla Schmidt, MD;  Location: [facility];  Service: Orthopedics;  Laterality: Right;  WITH PARTIAL MEDIAL MENISECTOMY AND REMOVAL OF LOOSE BODIES   torn rotator cuff     Patient Active Problem List   Diagnosis Date Noted   Unilateral primary osteoarthritis, right hip 02/27/2023   Hyperlipidemia 05/10/2017   Erectile dysfunction 05/10/2017   Memory difficulty 09/26/2016   Hemochromatosis 07/11/2016   Episodic lightheadedness 12/25/2015   Anemia, unspecified 12/25/2015   Chronic left-sided low back pain with sciatica 07/15/2015   Essential hypertension 02/15/2015   Rotator cuff tear 06/09/2014   GERD (gastroesophageal reflux disease) 05/13/2014    PCP: Shade Flood, MD  REFERRING PROVIDER: Kirtland Bouchard, PA-C  REFERRING DIAG: [phone] (ICD-10-CM) - Chronic left-sided low back pain with left-sided sciatica  Rationale for Evaluation and Treatment: Rehabilitation  THERAPY DIAG:  Other low back pain  Muscle weakness (generalized)  Pain in left leg  Pain in  left hip  ONSET DATE: 11/15/23   SUBJECTIVE:                                                                                                                                                                                           SUBJECTIVE STATEMENT: Still having some episodes of pain.  Feels 60-70% better.  Walking pain is about 3-4/10 with shooting pains up to 7/10.  PERTINENT HISTORY:  Depression, hyperlipidemia, HTN, anxiety, OA  PAIN:  NPRS scale: 0 currently, up to 7/10 Pain location:  Lt anterior thigh Pain description: tightness, burning, spasm Aggravating factors: standing, walking Relieving factors: sitting, lying down, ?maybe prednisone  PRECAUTIONS:  None  RED FLAGS: None   WEIGHT BEARING RESTRICTIONS:  No  FALLS:  Has patient fallen in last 6 months? No  LIVING ENVIRONMENT: Lives with: lives alone Lives in: House/apartment Stairs: Yes: External: 4 steps; none   OCCUPATION:  Paramedic  PLOF:  Independent and Leisure: exercise - elliptical and weights, reading  PATIENT GOALS:  Improve pain   OBJECTIVE:   DIAGNOSTIC FINDINGS: X-rays: Lumbar spine: No acute fracture. Loss of lordotic curvature. L3 compression fracture of unknown age was not present on 2020 films. Significant vertebral anterior osteophytes throughout the lumbar spine loss of disc space at multiple levels. Scoliosis. No spondylolisthesis  Old MRI spinal and foraminal stenosis of lumbar spine  PATIENT SURVEYS:  11/22/23 FOTO 44 (predicted 72) 12/18/23: FOTO 54  COGNITIVE STATUS: Within functional limits for tasks assessed   SENSATION: WFL  POSTURE:  rounded shoulders and forward head  GAIT: 11/22/23 Comments: Lt lateral shift, slight forward lean  PALPATION: 11/22/23 no pain or tenderness to Lt quad, glutes, QL; Gr1 CPA mobs to T8-L5 WNL without pain  LUMBAR ROM:   Active  A/PROM  eval 12/04/2023  Flexion WNL No complaints  Extension To neutral with increase in  thigh pain To neutral  Right Quadrant WNL   Left Quadrant WNL    (Blank rows = not tested)      LOWER EXTREMITY MMT:    MMT Right eval Left eval Left 12/18/23  Hip flexion 5/5 5/5   Hip extension 4/5 4/5 4/5  Hip abduction 5/5 4/5 3+/5  Hip adduction     Hip internal rotation     Hip external rotation     Knee flexion 5/5 5/5   Knee extension 5/5 5/5   Ankle dorsiflexion     Ankle plantarflexion     Ankle inversion     Ankle eversion      (Blank rows = not tested)    SPECIAL TESTS:  12/04/2023: (+) Thomas test Lt for hip flexor and quad tightness, concordant symptoms noted.   11/22/23 Lumbar Slump test: Negative                  TREATMENT 12/18/23 TherEx NuStep L6 x 10 min Standing lumbar extension 10 x 10 sec hold (to about neutral) Supine hip flexor stretch on Lt 3x30 sec hold Hooklying single limb clamshell L4 band 2x10 reps bil Bridges 2x10; 5 sec hold MMT - see above for details Discussed gym exercises and to let pain/symptoms be guide at this time  12/11/23 TherEx NuStep L6 x 10 min Supine Lt hip flexor stretch 3x30 sec - discussed using sheet for added quad stretch if needed Use of tennis ball for self release to quad and hip flexors Standing lumbar extension 10 x 10 sec hold (to about neutral) Standing hip extension x10 bil (Lt quad spasm when doing RLE resolved once weight bearing stopped) Prone lying for hip flexor stretch and discussed prone on elbows progression x 2 min   12/04/23 Manual Percussive device Lt quad musculature in thigh and anterior hip  Therex: Supine thomas stretch Lt leg 30 sec x 3 (instruction for home c cues) Supine bridge x 15  Standing lumbar extension AROM attempts 2 x 5 Nustep Lvl 6 10 mins  Adjustments to HEP with verbal cues and education on stretching.    11/22/23 See HEP - demonstrated with trial  reps performed PRN for comprehension; Lt lateral shift reproduced thigh pain but no change with repeated movements  - still recommended he continue and will reassess at next visit    PATIENT EDUCATION:  Education details: HEP Person educated: Patient Education method: Explanation, Demonstration, and Handouts Education comprehension: verbalized understanding, returned demonstration, and needs further education  HOME EXERCISE PROGRAM: Access Code: [email or web address] URL: [email or web address] Date: 12/04/2023 Prepared by: Chyrel Masson  Exercises - Left Standing Lateral Shift Correction at Wall - Repetitions  - 3-5 x daily - 7 x weekly - 1 sets - 10 reps - 10 sec hold - Prone Quadriceps Stretch with Strap  - 2-3 x daily - 7 x weekly - 1 sets - 10 reps - 10 sec hold - Hooklying Single Knee to Chest  - 2-3 x daily - 7 x weekly - 1 sets - 3 reps - 30 sec hold - Supine Piriformis Stretch with Foot on Ground  - 2-3 x daily - 7 x weekly - 1 sets - 3 reps - 30 sec hold - Thomas Stretch on Table  - 2-3 x daily - 7 x weekly - 1 sets - 3-5 reps - 20-30 hold   ASSESSMENT:  CLINICAL IMPRESSION: All of pt's LTGs are ongoing at this time, and pt is requesting to hold PT until after he sees the MD.  PT agreeable to this plan as limited progress has been made.   OBJECTIVE IMPAIRMENTS: Abnormal gait, decreased mobility, difficulty walking, decreased ROM, decreased strength, postural dysfunction, and pain.   ACTIVITY LIMITATIONS: carrying, lifting, standing, squatting, and locomotion level  PARTICIPATION LIMITATIONS: meal prep, cleaning, laundry, community activity, and occupation  PERSONAL FACTORS: 3+ comorbidities: Depression, hyperlipidemia, HTN, anxiety, OA  are also affecting patient's functional outcome.   REHAB POTENTIAL: Good  CLINICAL DECISION MAKING: Evolving/moderate complexity  EVALUATION COMPLEXITY: Moderate   GOALS: Goals reviewed with patient? Yes  SHORT TERM GOALS: Target date: 12/13/2023  Independent with initial HEP Goal status: MET 12/18/23    LONG TERM GOALS: Target date:  01/03/2024   Independent with final HEP Goal status: ONGOING 12/18/23  2.  FOTO score improved to 72 Goal status: ONGOING 12/18/23  3.  LLE strength improved to 4/5 for improved function and mobility Goal status: ONGOING 12/18/23  4.  Report pain < 3/10 with standing and walking for improved function Goal status: ONGOING 12/18/23  5.  Report centralization of symptoms for improved function Goal status: ONGOING 12/18/23   PLAN:  PT FREQUENCY: 1x/week  PT DURATION: 6 weeks  PLANNED INTERVENTIONS: 97164- PT Re-evaluation, 97110-Therapeutic exercises, 97530- Therapeutic activity, 97112- Neuromuscular re-education, 97535- Self Care, 54098- Manual therapy, [id number]- Gait training, [phone]- Aquatic Therapy, 97014- Electrical stimulation (unattended), [phone]- Traction (mechanical), Patient/Family education, Taping, Dry Needling, Spinal manipulation, Spinal mobilization, Cryotherapy, and Moist heat.  PLAN FOR NEXT SESSION: hold PT; recert v/s d/c; work on neutral/extension based exercise as able;  Continue possible STM /percussive device use to loosen up tightness.  Improved mobility from lumbar and hips.    NEXT MD VISIT: 01/08/24   Clarita Crane, PT, DPT 12/18/23 8:39 AM    PHYSICAL THERAPY DISCHARGE SUMMARY  Visits from Start of Care: 4  Current functional level related to goals / functional outcomes: See above   Remaining deficits: See above   Education / Equipment: HEP   Patient agrees to discharge. Patient goals were not met. Patient is being discharged due to not returning since the last visit.   Clarita Crane, PT, DPT 04/04/24 1:23  PM  [facility] [address] [place], [place], [address] Phone: [phone]   Fax:  [phone]

## 2023-12-25 ENCOUNTER — Encounter: Payer: BC Managed Care – PPO | Admitting: Physical Therapy

## 2023-12-28 ENCOUNTER — Encounter: Payer: BC Managed Care – PPO | Admitting: Family Medicine

## 2024-01-08 ENCOUNTER — Ambulatory Visit: Payer: BC Managed Care – PPO | Admitting: Orthopaedic Surgery

## 2024-01-08 ENCOUNTER — Encounter: Payer: Self-pay | Admitting: Orthopaedic Surgery

## 2024-01-08 ENCOUNTER — Other Ambulatory Visit: Payer: Self-pay

## 2024-01-08 DIAGNOSIS — M79652 Pain in left thigh: Secondary | ICD-10-CM

## 2024-01-08 DIAGNOSIS — G8929 Other chronic pain: Secondary | ICD-10-CM

## 2024-01-08 NOTE — Progress Notes (Signed)
 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  He still gets a twitching sensation in that thigh and it feels weak to him.  He has been in outpatient physical therapy.  If he is lying down or sitting he is okay but he does not feel like that leg can support him.  He says his hips and knees are doing fine.  Again he has been to outpatient physical therapy as well as a relates to this issue.  On exam he still has a lot of pain deep within his thigh and some muscle twitching on the left side.  His left hip and left knee exam are unremarkable.  He does have pain in the back of his leg on the left side with straight leg raise.  His previous plain film findings of the lumbar spine show loss of his lumbar lordosis and a remote compression fracture at L3.  At this point a MRI is warranted of his lumbar spine to rule out nerve compression to the left side that is potentially causing his left thigh radicular symptoms.  We will work on getting this scheduled and we will see him back accordingly in follow-up.  All question concerns were addressed and answered.

## 2024-01-12 ENCOUNTER — Encounter: Payer: Self-pay | Admitting: Hematology

## 2024-01-12 ENCOUNTER — Encounter: Payer: Self-pay | Admitting: Orthopaedic Surgery

## 2024-01-20 ENCOUNTER — Ambulatory Visit
Admission: RE | Admit: 2024-01-20 | Discharge: 2024-01-20 | Disposition: A | Discharge status: Home / Self Care | Payer: BC Managed Care – PPO | Source: Ambulatory Visit | Attending: Orthopaedic Surgery | Admitting: Orthopaedic Surgery

## 2024-01-20 DIAGNOSIS — M48061 Spinal stenosis, lumbar region without neurogenic claudication: Secondary | ICD-10-CM | POA: Diagnosis not present

## 2024-01-20 DIAGNOSIS — G8929 Other chronic pain: Secondary | ICD-10-CM

## 2024-01-20 DIAGNOSIS — M79652 Pain in left thigh: Secondary | ICD-10-CM

## 2024-01-31 ENCOUNTER — Ambulatory Visit (INDEPENDENT_AMBULATORY_CARE_PROVIDER_SITE_OTHER): Payer: BC Managed Care – PPO | Admitting: Orthopaedic Surgery

## 2024-01-31 ENCOUNTER — Encounter: Payer: Self-pay | Admitting: Orthopaedic Surgery

## 2024-01-31 ENCOUNTER — Other Ambulatory Visit: Payer: Self-pay

## 2024-01-31 DIAGNOSIS — M5442 Lumbago with sciatica, left side: Secondary | ICD-10-CM | POA: Diagnosis not present

## 2024-01-31 DIAGNOSIS — G8929 Other chronic pain: Secondary | ICD-10-CM

## 2024-01-31 NOTE — Progress Notes (Signed)
 Blake Roberts comes in today to go over MRI of his lumbar spine.  He is well-known to us .  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  He has been dealing with chronic back pain for some time now.  He is only 58 years old and not morbidly obese.  It hurts most when he stands up and it does radiate into both thighs more so on the left than the right.  We have seen him before for his right hip and he does have known arthritis in his right hip and we have seen him for his shoulder.  He denies any change in bowel or bladder function or weakness in his leg.  Denies any numbness or tingling in his feet.  On exam his right hip definitely has some stiffness with internal and external rotation.  This is consistent with his known arthritis.  The left hip moves more smoothly.  His back is incredibly stiff on flexion and extension and when he stands up he can tell he favors his back more than anything.  He is on his feet a lot and that is what bothers his back the most.  The MRI is reviewed with him and we went over the images.  He has severe multifactorial stenosis both central but mainly foraminal at multiple levels.  It is most severe on the left at L3-L4 and L4-L5 and at the right at L5-S1.  At this point I would like to send him to one of my colleagues with [facility] either Blake Roberts or Blake Roberts for further evaluation and treatment.  The patient has had epidural steroids in the past as well.  He would like to get a surgical evaluation as well to best determine what the treatment options are at this point.

## 2024-02-05 ENCOUNTER — Encounter: Payer: BC Managed Care – PPO | Admitting: Family Medicine

## 2024-02-22 DIAGNOSIS — M48062 Spinal stenosis, lumbar region with neurogenic claudication: Secondary | ICD-10-CM | POA: Diagnosis not present

## 2024-02-22 DIAGNOSIS — Z6829 Body mass index (BMI) 29.0-29.9, adult: Secondary | ICD-10-CM | POA: Diagnosis not present

## 2024-03-13 ENCOUNTER — Encounter: Payer: BC Managed Care – PPO | Admitting: Family Medicine

## 2024-04-13 ENCOUNTER — Other Ambulatory Visit: Payer: Self-pay | Admitting: Family Medicine

## 2024-04-13 DIAGNOSIS — I10 Essential (primary) hypertension: Secondary | ICD-10-CM

## 2024-04-28 ENCOUNTER — Other Ambulatory Visit: Payer: Self-pay | Admitting: Family Medicine

## 2024-04-28 DIAGNOSIS — I10 Essential (primary) hypertension: Secondary | ICD-10-CM

## 2024-04-29 ENCOUNTER — Encounter: Payer: Self-pay | Admitting: Physician Assistant

## 2024-04-29 ENCOUNTER — Ambulatory Visit: Admitting: Physician Assistant

## 2024-04-29 DIAGNOSIS — M1611 Unilateral primary osteoarthritis, right hip: Secondary | ICD-10-CM | POA: Diagnosis not present

## 2024-04-29 NOTE — Progress Notes (Signed)
 HPI: Blake Roberts returns today for right hip pain.  States he had flareup of right hip pain about 2 weeks ago.  He underwent right hip injection with Dr. Vaughn Georges on 08/01/2023 and is questioning if he could have another injection.  He is wanting to undergo right total hip arthroplasty sometime after the summer.  Ranks his right hip pain 7 out of 10 pain at worst.  He is also dealing with lumbar stenosis and radicular symptoms down his left leg which he sees Dr. Lamon Pillow.  Patient notes that he has increased pain with prolonged walking and standing he has no pain with sitting.  Sleeps with a pillow between his legs at night due to the right hip pain.  Again radiographs of his right pelvis shows moderate degenerative changes.  Cam deformities of both hips noted.  Review of systems: Denies any recent injuries to the right hip.  No fevers chills.  Physical exam: General Well-developed well-nourished male ambulates without any assistive device. Psych: Alert and oriented x 3 Bilateral hips decreased internal rotation of both hips right greater than left.  Discomfort with internal rotation right hip.  Ambulates without antalgic gait.  Impression: Right hip osteoarthritis  Plan: We will have him undergo a right hip cortisone injection with Dr. Vaughn Georges in the near future.  Follow-up with us  as needed.  Questions were encouraged and answered at length.

## 2024-05-08 ENCOUNTER — Other Ambulatory Visit: Payer: Self-pay

## 2024-05-08 ENCOUNTER — Encounter: Payer: Self-pay | Admitting: Sports Medicine

## 2024-05-08 ENCOUNTER — Ambulatory Visit: Admitting: Sports Medicine

## 2024-05-08 DIAGNOSIS — M1611 Unilateral primary osteoarthritis, right hip: Secondary | ICD-10-CM

## 2024-05-08 MED ORDER — LIDOCAINE HCL 1 % IJ SOLN
4.0000 mL | INTRAMUSCULAR | Status: AC | PRN
  Administered 2024-05-08: 4 mL

## 2024-05-08 MED ORDER — METHYLPREDNISOLONE ACETATE 40 MG/ML IJ SUSP
80.0000 mg | INTRAMUSCULAR | Status: AC | PRN
  Administered 2024-05-08: 80 mg via INTRA_ARTICULAR

## 2024-05-08 NOTE — Progress Notes (Signed)
   Procedure Note  Patient: Blake Roberts             Date of Birth: 1966/02/27           MRN: [id number]             Visit Date: 05/08/2024  Procedures: Visit Diagnoses:  1. Unilateral primary osteoarthritis, right hip    Large Joint Inj: R hip joint on 05/08/2024 8:32 AM Indications: pain Details: 22 G 3.5 in needle, ultrasound-guided anterior approach Medications: 4 mL lidocaine  1 %; 80 mg methylPREDNISolone  acetate 40 MG/ML Outcome: tolerated well, no immediate complications  Procedure: US -guided intra-articular hip injection, Right After discussion on risks/benefits/indications and informed verbal consent was obtained, a timeout was performed. Patient was lying supine on exam table. The hip was cleaned with betadine  and alcohol swabs. Then utilizing ultrasound guidance, the patient's femoral head and neck junction was identified and subsequently injected with 4:2 lidocaine :depomedrol via an in-plane approach with ultrasound visualization of the injectate administered into the hip joint. Patient tolerated procedure well without immediate complications.  Procedure, treatment alternatives, risks and benefits explained, specific risks discussed. Consent was given by the patient. Immediately prior to procedure a time out was called to verify the correct patient, procedure, equipment, support staff and site/side marked as required. Patient was prepped and draped in the usual sterile fashion.     - patient tolerated procedure well, discussed post-injection protocol - follow-up with Dr. Monnie Anthony as indicated; I am happy to see them as needed - he had questions about hip replacement and expected recovery, I gave general overview of these today. Will discuss more with Dr. Lucienne Ryder and team when ready  Shauna Del, DO Primary Care Sports Medicine Physician  [facility]  This note was dictated using Dragon naturally speaking software and may contain errors  in syntax, spelling, or content which have not been identified prior to signing this note.

## 2024-05-28 ENCOUNTER — Other Ambulatory Visit (INDEPENDENT_AMBULATORY_CARE_PROVIDER_SITE_OTHER): Payer: Self-pay

## 2024-05-28 ENCOUNTER — Ambulatory Visit: Admitting: Physician Assistant

## 2024-05-28 DIAGNOSIS — M1611 Unilateral primary osteoarthritis, right hip: Secondary | ICD-10-CM | POA: Diagnosis not present

## 2024-05-28 NOTE — Progress Notes (Signed)
 HPI: Aristide returns today follow-up right hip status post intra-articular injection right hip by Dr. Vaughn Georges.  He states the injection was very helpful.  More than 50% improvement.  Has pain in the hip only when standing or walking for prolonged period time.  He does note that he keeps the right leg somewhat turned outward.  Currently he is wanting to undergo right total hip arthroplasty sometime after Labor Day.  Review of systems: Denies any fevers chills or ongoing infections.  Physical exam: General Well-developed well-nourished male in no acute distress mood and affect appropriate. Psych: Alert and oriented x 3 Bilateral hips: Good range of motion left hip without pain.  Right hip virtually no internal rotation.  Somewhat limited external rotation.  He has pain with range of motion of the right hip.  Radiographs: AP pelvis shows both hips to be well located.  Moderate narrowing of the left hip.  Near bone-on-bone right hip.  Cam impingement bilaterally.  No acute fractures or acute findings.  Impression: End-stage arthritis right hip Left hip arthritis  Plan: Will see the patient back sometime in late July early August to discuss hip replacement.  Questions were encouraged and answered at length.  He will continue full work duties.

## 2024-09-05 ENCOUNTER — Other Ambulatory Visit (INDEPENDENT_AMBULATORY_CARE_PROVIDER_SITE_OTHER): Payer: Self-pay

## 2024-09-05 ENCOUNTER — Encounter: Payer: Self-pay | Admitting: Physician Assistant

## 2024-09-05 ENCOUNTER — Ambulatory Visit: Admitting: Physician Assistant

## 2024-09-05 ENCOUNTER — Encounter: Payer: Self-pay | Admitting: Hematology

## 2024-09-05 VITALS — Ht 71.26 in | Wt 217.2 lb

## 2024-09-05 DIAGNOSIS — M1611 Unilateral primary osteoarthritis, right hip: Secondary | ICD-10-CM

## 2024-09-05 DIAGNOSIS — M25512 Pain in left shoulder: Secondary | ICD-10-CM

## 2024-09-05 NOTE — Progress Notes (Signed)
 Office Visit Note   Patient: Blake Roberts           Date of Birth: November 25, 1966           MRN: [id number] Visit Date: 09/05/2024              Requested by: Levora Reyes SAUNDERS, MD [phone] [place],  [place] [address] PCP: Levora Reyes SAUNDERS, MD   Assessment & Plan: Visit Diagnoses:  1. Left shoulder pain, unspecified chronicity   2. Unilateral primary osteoarthritis, right hip     Plan: In regards to his shoulder recommend activities as tolerated.  Did review the radiographs which show significant arthritis both of the glenohumeral joint and the [facility] joint.  At this point in time his rotator cuff seems strong that he is having no pain there is crepitus in the shoulder.  Therefore we will recommend no treatment.  As far as his right hip goes per his request we will send him for an intra-articular injection right hip with Dr. Burnetta. He will follow-up with us  sometime in November for possible right total hip arthroplasty scheduling.  Questions were encouraged and answered.  Follow-Up Instructions: Return in about 2 months (around 11/05/2024).   Orders:  Orders Placed This Encounter  Procedures   XR Shoulder Left   No orders of the defined types were placed in this encounter.     Procedures: No procedures performed   Clinical Data: No additional findings.   Subjective: Chief Complaint  Patient presents with   Right Hip - Pain   Left Shoulder - Pain    HPI Blake Roberts returns today to discuss his right hip.  He states he is not ready for hip surgery at this point in time he would like to have an intra-articular injection in the hip.  He has had no change in his overall medical status and has had no injury to the right hip.  He is also having left shoulder function.  He states that he feels like he has gravel in his shoulder but has no pain.  He is able to workout with free weights.  He does have a history of left shoulder arthroscopy with extensive debridement and  subacromial decompression.  No new injury to the shoulder. Review of Systems   Objective: Vital Signs: Ht 5' 11.26 (1.81 m)   Wt 217 lb 3.2 oz (98.5 kg)   BMI 30.07 kg/m   Physical Exam Constitutional:      Appearance: He is not ill-appearing or diaphoretic.  Pulmonary:     Effort: Pulmonary effort is normal.  Neurological:     Mental Status: He is alert and oriented to person, place, and time.  Psychiatric:        Mood and Affect: Mood normal.     Ortho Exam Bilateral shoulders: 5 out of 5 strength with external/internal rotation against resistance.  Empty can test is negative bilaterally.  Has limited external rotation of the left shoulder full range of motion of the shoulders otherwise.  Liftoff exam is negative bilaterally.  Left shoulder significant crepitus with internal/external rotation. Bilateral hips: Left hip good range of motion without pain.  Right hip he has no significant pain with internal/external rotation attempts but has virtually no internal rotation of the right hip. Specialty Comments:  No specialty comments available.  Imaging: XR Shoulder Left Result Date: 09/05/2024 Radiographs: Left shoulder 3 views shows moderate to severe AC joint Riddick changes.  Glenohumeral joint narrowing moderately  severe with periarticular spurring.  No acute fractures acute findings.  Shoulder is well located.    PMFS History: Patient Active Problem List   Diagnosis Date Noted   Unilateral primary osteoarthritis, right hip 02/27/2023   Hyperlipidemia 05/10/2017   Erectile dysfunction 05/10/2017   Memory difficulty 09/26/2016   Hemochromatosis 07/11/2016   Episodic lightheadedness 12/25/2015   Anemia, unspecified 12/25/2015   Chronic left-sided low back pain with sciatica 07/15/2015   Essential hypertension 02/15/2015   Rotator cuff tear 06/09/2014   GERD (gastroesophageal reflux disease) 05/13/2014   Past Medical History:  Diagnosis Date   Acute meniscal tear of  knee RIGHT KNEE   Anxiety    Arthritis RIGHT KNEE   Bulging lumbar disc L5 - S1   Depression    Hemochromatosis    Hyperlipidemia    Hypertension     Family History  Problem Relation Age of Onset   Breast cancer Mother    Lung cancer Mother    Diabetes Father        adopted father; no history of biological father   Heart disease Father    Hyperlipidemia Father    Hypertension Father    Kidney disease Father    Heart Problems Brother        has pacemaker also handicapped   Pancreatic cancer Maternal Uncle        age early 85's   Colon cancer Neg Hx     Past Surgical History:  Procedure Laterality Date   KNEE ARTHROSCOPY  AGE 31 (APPROX)   RIGHT KNEE   KNEE ARTHROSCOPY  07/13/2012   Procedure: ARTHROSCOPY KNEE;  Surgeon: Lynwood SHAUNNA Bern, MD;  Location: [facility];  Service: Orthopedics;  Laterality: Right;  WITH PARTIAL MEDIAL MENISECTOMY AND REMOVAL OF LOOSE BODIES   torn rotator cuff     Social History   Occupational History   Occupation: paramedic  Tobacco Use   Smoking status: Never   Smokeless tobacco: Never  Substance and Sexual Activity   Alcohol use: Yes    Comment: 4 beers a month,states heavy beer drinker in younger years   Drug use: No   Sexual activity: Yes

## 2024-09-18 ENCOUNTER — Other Ambulatory Visit: Payer: Self-pay

## 2024-09-18 ENCOUNTER — Ambulatory Visit: Admitting: Sports Medicine

## 2024-09-18 ENCOUNTER — Encounter: Payer: Self-pay | Admitting: Sports Medicine

## 2024-09-18 DIAGNOSIS — M1611 Unilateral primary osteoarthritis, right hip: Secondary | ICD-10-CM | POA: Diagnosis not present

## 2024-09-18 MED ORDER — LIDOCAINE HCL 1 % IJ SOLN
4.0000 mL | INTRAMUSCULAR | Status: AC | PRN
  Administered 2024-09-18: 4 mL

## 2024-09-18 MED ORDER — METHYLPREDNISOLONE ACETATE 40 MG/ML IJ SUSP
80.0000 mg | INTRAMUSCULAR | Status: AC | PRN
  Administered 2024-09-18: 80 mg via INTRA_ARTICULAR

## 2024-09-18 NOTE — Progress Notes (Signed)
 Patient says that he got great relief from the last injection. He says that this will likely be his last injection before he moves forward with a hip replacement. He did have to call out of work on Monday due to his hip locking up; this has improved since Monday with stretching.

## 2024-09-18 NOTE — Progress Notes (Signed)
   Procedure Note  Patient: Blake Roberts             Date of Birth: 09/06/1966           MRN: [id number]             Visit Date: 09/18/2024  Procedures: Visit Diagnoses:  1. Unilateral primary osteoarthritis, right hip    Large Joint Inj: R hip joint on 09/18/2024 8:34 AM Indications: pain Details: 22 G 3.5 in needle, ultrasound-guided anterior approach Medications: 4 mL lidocaine  1 %; 80 mg methylPREDNISolone  acetate 40 MG/ML Outcome: tolerated well, no immediate complications  Procedure: US -guided intra-articular hip injection, Right After discussion on risks/benefits/indications and informed verbal consent was obtained, a timeout was performed. Patient was lying supine on exam table. The hip was cleaned with betadine  and alcohol swabs. Then utilizing ultrasound guidance, the patient's femoral head and neck junction was identified and subsequently injected with 4:2 lidocaine :depomedrol via an in-plane approach with ultrasound visualization of the injectate administered into the hip joint. Patient tolerated procedure well without immediate complications.  Procedure, treatment alternatives, risks and benefits explained, specific risks discussed. Consent was given by the patient. Immediately prior to procedure a time out was called to verify the correct patient, procedure, equipment, support staff and site/side marked as required. Patient was prepped and draped in the usual sterile fashion.     - patient tolerated procedure well, discussed post-injection protocol - follow-up with Dr. Lorane Gaskins [facility] as indicated; I am happy to see them as needed - Did discuss he would be eligible for total hip replacement > 2 months from today's injection, could consider repeat injections only if not pursuing surgical intervention  Lonell Sprang, DO Primary Care Sports Medicine Physician  [facility] - Orthopedics  This note was dictated using Dragon naturally speaking software and may  contain errors in syntax, spelling, or content which have not been identified prior to signing this note.

## 2024-10-21 ENCOUNTER — Encounter: Payer: Self-pay | Admitting: Orthopaedic Surgery

## 2024-10-21 ENCOUNTER — Ambulatory Visit: Admitting: Orthopaedic Surgery

## 2024-10-21 VITALS — Ht 70.0 in | Wt 219.4 lb

## 2024-10-21 DIAGNOSIS — M1611 Unilateral primary osteoarthritis, right hip: Secondary | ICD-10-CM

## 2024-10-21 MED ORDER — CYCLOBENZAPRINE HCL 10 MG PO TABS: 10.0000 mg | ORAL_TABLET | Freq: Three times a day (TID) | ORAL | 1 refills | Status: DC | PRN

## 2024-10-21 MED ORDER — METHYLPREDNISOLONE 4 MG PO TABS: ORAL_TABLET | ORAL | 0 refills | Status: DC

## 2024-10-21 NOTE — Progress Notes (Signed)
 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  He does have x-rays that we did go over today again showing complete loss of joint space on the right hip with bone-on-bone wear.  He has significant flattening of the femoral head.  He does have narrowing of his left hip.  He works on a regular basis.  He did have a fall yesterday twisting his back on a wet deck at his house.  I did send in some Flexeril  and a steroid taper to help with that.  He understands that if he is not able to do his job the next few days to not hesitate to give us  a call and we can have him out of work for few days.  He says the hip injection has calm things down for the short-term.  He would like to discuss hip replacement surgery today.  At this point his right hip pain is daily and it is detrimentally affecting his mobility, his quality of life and his actives daily living.  He is a longtime patient of ours.  On examination of Bates's right hip today there is significant stiffness with internal and external rotation and a lot of pain in the groin.  He walks with a Trendelenburg gait and when he first gets up he does walk with a significant limp.  Again the x-rays show bone-on-bone wear of the right hip with complete loss of joint space and flattening of the femoral head as well as sclerotic changes.  We had a long and thorough discussion about hip replacement surgery.  I showed him a hip replacement model and gave him a handout about hip replacement surgery.  We discussed the risks and benefits of the surgery and what to expect from an intraoperative and postoperative standpoint.  He would like to consider having this scheduled sometime in early January after the holidays.  We will work on getting this scheduled and be in touch.  If there are issues before then he needs to reach out and let us   know.  He would definitely need to be out of work for up to 3 months given his high demand and high impact job.

## 2024-10-28 ENCOUNTER — Encounter: Payer: Self-pay | Admitting: Radiology

## 2024-11-06 ENCOUNTER — Telehealth: Payer: Self-pay

## 2024-11-06 NOTE — Telephone Encounter (Signed)
 I called patient to discuss scheduling right THA.  Left voice mail message for return call.

## 2024-11-11 ENCOUNTER — Telehealth: Payer: Self-pay

## 2024-11-11 NOTE — Telephone Encounter (Signed)
 Patient left voice mail for return call to schedule THA.  I called patient back and left voice mail for return call.

## 2024-11-20 ENCOUNTER — Other Ambulatory Visit: Payer: Self-pay | Admitting: Family Medicine

## 2024-11-20 DIAGNOSIS — I10 Essential (primary) hypertension: Secondary | ICD-10-CM

## 2024-11-20 NOTE — Telephone Encounter (Signed)
 Called patient to schedule appt. Patient hasn't been seen in over a year. Refills will not be sent until appointment has been made

## 2024-12-11 ENCOUNTER — Encounter: Payer: Self-pay | Admitting: Family Medicine

## 2024-12-11 ENCOUNTER — Ambulatory Visit: Admitting: Family Medicine

## 2024-12-11 VITALS — BP 134/78 | HR 94 | Temp 98.4°F | Resp 16 | Ht 70.0 in | Wt 219.8 lb

## 2024-12-11 DIAGNOSIS — N529 Male erectile dysfunction, unspecified: Secondary | ICD-10-CM

## 2024-12-11 DIAGNOSIS — R7303 Prediabetes: Secondary | ICD-10-CM | POA: Diagnosis not present

## 2024-12-11 DIAGNOSIS — I10 Essential (primary) hypertension: Secondary | ICD-10-CM

## 2024-12-11 DIAGNOSIS — E782 Mixed hyperlipidemia: Secondary | ICD-10-CM

## 2024-12-11 DIAGNOSIS — H538 Other visual disturbances: Secondary | ICD-10-CM

## 2024-12-11 DIAGNOSIS — Z23 Encounter for immunization: Secondary | ICD-10-CM | POA: Diagnosis not present

## 2024-12-11 DIAGNOSIS — R200 Anesthesia of skin: Secondary | ICD-10-CM | POA: Diagnosis not present

## 2024-12-11 DIAGNOSIS — R42 Dizziness and giddiness: Secondary | ICD-10-CM | POA: Diagnosis not present

## 2024-12-11 LAB — LIPID PANEL
Cholesterol: 245 mg/dL — ABNORMAL HIGH (ref 28–200)
HDL: 64.3 mg/dL (ref >39.00)
LDL Cholesterol: 156 mg/dL — ABNORMAL HIGH (ref 10–99)
NonHDL: 181
Total CHOL/HDL Ratio: 4
Triglycerides: 125 mg/dL (ref 10.0–149.0)
VLDL: 25 mg/dL (ref 0.0–40.0)

## 2024-12-11 LAB — COMPREHENSIVE METABOLIC PANEL WITH GFR
ALT: 21 U/L (ref 3–53)
AST: 21 U/L (ref 5–37)
Albumin: 4.9 g/dL (ref 3.5–5.2)
Alkaline Phosphatase: 65 U/L (ref 39–117)
BUN: 15 mg/dL (ref 6–23)
CO2: 31 meq/L (ref 19–32)
Calcium: 9.9 mg/dL (ref 8.4–10.5)
Chloride: 95 meq/L — ABNORMAL LOW (ref 96–112)
Creatinine, Ser: 0.91 mg/dL (ref 0.40–1.50)
GFR: 92.55 mL/min (ref >60.00)
Glucose, Bld: 110 mg/dL — ABNORMAL HIGH (ref 70–99)
Potassium: 4.4 meq/L (ref 3.5–5.1)
Sodium: 136 meq/L (ref 135–145)
Total Bilirubin: 0.8 mg/dL (ref 0.2–1.2)
Total Protein: 8.1 g/dL (ref 6.0–8.3)

## 2024-12-11 LAB — CBC
HCT: 44.1 % (ref 39.0–52.0)
Hemoglobin: 14.9 g/dL (ref 13.0–17.0)
MCHC: 33.9 g/dL (ref 30.0–36.0)
MCV: 97.6 fl (ref 78.0–100.0)
Platelets: 293 K/uL (ref 150.0–400.0)
RBC: 4.51 Mil/uL (ref 4.22–5.81)
RDW: 13 % (ref 11.5–15.5)
WBC: 6.8 K/uL (ref 4.0–10.5)

## 2024-12-11 LAB — HEMOGLOBIN A1C: Hgb A1c MFr Bld: 5.7 % (ref 4.6–6.5)

## 2024-12-11 LAB — TSH: TSH: 2.04 u[IU]/mL (ref 0.35–5.50)

## 2024-12-11 MED ORDER — TADALAFIL 20 MG PO TABS: 10.0000 mg | ORAL_TABLET | Freq: Every day | ORAL | 3 refills | Status: DC | PRN

## 2024-12-11 MED ORDER — HYDROCHLOROTHIAZIDE 25 MG PO TABS: ORAL_TABLET | ORAL | 1 refills | Status: AC

## 2024-12-11 MED ORDER — AMLODIPINE BESYLATE 10 MG PO TABS: ORAL_TABLET | ORAL | 1 refills | Status: AC

## 2024-12-11 NOTE — Patient Instructions (Addendum)
 Thank you for coming in today.  I will check some labs initially and then we will follow-up in the next few weeks to discuss the intermittent vision, dizziness, balance issues further.  If any worsening of the symptoms in the meantime please be seen.  I will also refer you to ophthalmology to discuss the intermittent vision symptoms.  If any concerns on labs I will let you know.  No change in meds for now.  Depending on cholesterol level may need to restart that statin medication but certainly we can discuss any new side effects on that med or change to a different medication if needed.  Happy to discuss further.  Thank you for coming in today and take care.

## 2024-12-11 NOTE — Progress Notes (Signed)
 Subjective:  Patient ID: Blake Roberts, male    DOB: 04-08-1966  Age: 58 y.o. MRN: [id number]  CC:  Chief Complaint  Patient presents with   Medication Refill   Acute Visit    Numbness and tinging in left hand. Blurry vision sometimes. Sometimes has issues with balance/dizziness. Patient is not having sx right now.     HPI Blake Roberts presents for    Acute concerns as above.  He is overdue for chronic medication follow-up as well.  Last visit with me in November 2024 for acute issue, with chronic conditions discussed in May 2024.   Left hand numbness, tingling On and off past 6 months, no worsening, no limitations. Did note after shoulder arthroscopy and tear of biceps on left prior to these symtpoms. Notes with laying on left side or with use of elliptical. Improves with ROM. No neck pain. No hand weakness. No new HA.  R index and middle finger numb past year. No pain. No weakness.  R hand dominant.   Intermittent blurry vision Past few months. Comes and goes. Lasts few seconds.  No change in thirst or urination.  Home blood sugar around 120.  Has not seen optho.  No speech difficulty. No focal weakness.  Reading glasses only. No recent eye exam. Prior Dr. Roz. Hx of lasek.   Intermittent dizziness, balance issues Only happens 2-3 times. 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 - only lasts 2-3 seconds. Has spinal stenosis and compression fractures, has seen and neurosurgery. No spinal surgeries.  No falls, no focal weakness. No CP/palpitations.   Hx of HTN. Home BP 130/80 on average. Increased urination with hydrochlorothiazide , no other side effects. Also on norvasc  10mg  every day - no missed doses.  BP Readings from Last 3 Encounters:  12/11/24 134/78  10/27/23 (!) 144/78  05/11/23 112/74   Lab Results  Component Value Date   CREATININE 0.93 05/11/2023   Lab Results  Component Value Date   HGBA1C 5.7 05/11/2023      HLD- off lipitor past 4 months - diet approach. No side effects on med known.  Lab Results  Component Value Date   CHOL 184 05/11/2023   HDL 70.60 05/11/2023   LDLCALC 99 05/11/2023   LDLDIRECT 94.0 06/20/2022   TRIG 73.0 05/11/2023   CHOLHDL 3 05/11/2023   Lab Results  Component Value Date   ALT 14 05/11/2023   AST 16 05/11/2023   ALKPHOS 54 05/11/2023   BILITOT 0.6 05/11/2023    Erectile dysfunction.  1/2 dose cialis  effective. No HA/flushing, hearing or vision changes on med. No CP/DOE.   R hip replacement scheduled 01/10/25.   HM: 2nd shingrix  today.  Prevnar vaccine today.  Flu vaccine today.   History Patient Active Problem List   Diagnosis Date Noted   Unilateral primary osteoarthritis, right hip 02/27/2023   Hyperlipidemia 05/10/2017   Erectile dysfunction 05/10/2017   Memory difficulty 09/26/2016   Hemochromatosis 07/11/2016   Episodic lightheadedness 12/25/2015   Anemia, unspecified 12/25/2015   Chronic left-sided low back pain with sciatica 07/15/2015   Essential hypertension 02/15/2015   Rotator cuff tear 06/09/2014   GERD (gastroesophageal reflux disease) 05/13/2014   Past Medical History:  Diagnosis Date   Acute meniscal tear of knee RIGHT KNEE   Anxiety    Arthritis RIGHT KNEE   Bulging lumbar disc L5 - S1   Depression    Hemochromatosis    Hyperlipidemia    Hypertension  Past Surgical History:  Procedure Laterality Date   KNEE ARTHROSCOPY  AGE 82 (APPROX)   RIGHT KNEE   KNEE ARTHROSCOPY  07/13/2012   Procedure: ARTHROSCOPY KNEE;  Surgeon: Lynwood SHAUNNA Bern, MD;  Location: [facility];  Service: Orthopedics;  Laterality: Right;  WITH PARTIAL MEDIAL MENISECTOMY AND REMOVAL OF LOOSE BODIES   torn rotator cuff     Allergies[1] Prior to Admission medications  Medication Sig Start Date End Date Taking? Authorizing Provider  amLODipine  (NORVASC ) 10 MG tablet TAKE 1 TABLET(10 MG) BY MOUTH DAILY 11/20/24  Yes Levora Blake SAUNDERS, MD  cyclobenzaprine  (FLEXERIL ) 10 MG tablet Take 1 tablet (10 mg total) by mouth 3 (three) times daily as needed for muscle spasms. 10/21/24  Yes Vernetta Lonni GRADE, MD  hydrochlorothiazide  (HYDRODIURIL ) 25 MG tablet TAKE 1 TABLET(25 MG) BY MOUTH DAILY 04/15/24  Yes Levora Blake SAUNDERS, MD  atorvastatin  (LIPITOR) 20 MG tablet TAKE 1 TABLET(20 MG) BY MOUTH DAILY Patient not taking: Reported on 12/11/2024 05/11/23   Levora Blake SAUNDERS, MD  methylPREDNISolone  (MEDROL ) 4 MG tablet Take as directed Patient not taking: Reported on 12/11/2024 10/21/24   Vernetta Lonni GRADE, MD  tadalafil  (CIALIS ) 20 MG tablet Take 0.5 tablets (10 mg total) by mouth daily as needed for erectile dysfunction. Patient not taking: Reported on 12/11/2024 05/11/23   Levora Blake SAUNDERS, MD   Social History   Socioeconomic History   Marital status: Legally Separated    Spouse name: Not on file   Number of children: 0   Years of education: BA   Highest education level: Not on file  Occupational History   Occupation: paramedic  Tobacco Use   Smoking status: Never   Smokeless tobacco: Never  Substance and Sexual Activity   Alcohol use: Yes    Comment: 4 beers a month,states heavy beer drinker in younger years   Drug use: No   Sexual activity: Yes  Other Topics Concern   Not on file  Social History Narrative   Lives at home alone   Separated, 2 step-daughters   Right-handed   Caffeine: 2 diet Cokes per day   Social Drivers of Health   Tobacco Use: Low Risk (12/11/2024)   Patient History    Smoking Tobacco Use: Never    Smokeless Tobacco Use: Never    Passive Exposure: Not on file  Financial Resource Strain: Not on file  Food Insecurity: Not on file  Transportation Needs: Not on file  Physical Activity: Not on file  Stress: Not on file  Social Connections: Not on file  Intimate Partner Violence: Not on file  Depression (PHQ2-9): Low Risk (05/11/2023)   Depression (PHQ2-9)    PHQ-2 Score: 0   Alcohol Screen: Not on file  Housing: Not on file  Utilities: Not on file  Health Literacy: Not on file    Review of Systems  Per HPI.  Objective:   Vitals:   12/11/24 0819  BP: 134/78  Pulse: 94  Resp: 16  Temp: 98.4 F (36.9 C)  TempSrc: Temporal  SpO2: 97%  Weight: 219 lb 12.8 oz (99.7 kg)  Height: 5' 10 (1.778 m)     Physical Exam Vitals reviewed.  Constitutional:      Appearance: He is well-developed.  HENT:     Head: Normocephalic and atraumatic.  Eyes:     Extraocular Movements: Extraocular movements intact.     Conjunctiva/sclera: Conjunctivae normal.     Pupils: Pupils are equal, round, and reactive to light.  Neck:     Vascular: No carotid bruit or JVD.  Cardiovascular:     Rate and Rhythm: Normal rate and regular rhythm.     Heart sounds: Normal heart sounds. No murmur heard. Pulmonary:     Effort: Pulmonary effort is normal.     Breath sounds: Normal breath sounds. No rales.  Musculoskeletal:     Right lower leg: No edema.     Left lower leg: No edema.     Comments: Pain-free range of motion of C-spine, and does not reproduce symptoms into his arms/hands.  Negative Tinel, Phalen at bilateral wrists.  He does have slight decrease sensation at the tips of his index and middle phalanx on right finger.  Neurovascular intact distally otherwise and cap refill less than 1 second at all fingertips which are warm, no color changes.  Unable to reproduce symptoms on left hand.  Skin:    General: Skin is warm and dry.  Neurological:     General: No focal deficit present.     Mental Status: He is alert and oriented to person, place, and time.     GCS: GCS eye subscore is 4. GCS verbal subscore is 5. GCS motor subscore is 6.     Cranial Nerves: No cranial nerve deficit, dysarthria or facial asymmetry.     Motor: Motor function is intact. No pronator drift.     Coordination: Romberg sign negative. Coordination normal. Heel to [facility] Test normal. Rapid  alternating movements normal.     Gait: Gait is intact.     Comments: Somewhat antalgic gait with hip issues but steady, no assistive device needed.  Nonfocal neuroexam.  Psychiatric:        Mood and Affect: Mood normal.      Assessment & Plan:  Blake Roberts is a 58 y.o. male . Dizziness - Plan: CBC, TSH  - Brief, intermittent symptoms.  Quickly resolves.  Usually with turning, question middle ear.  Less likely orthostatic symptoms based on description of symptoms.  Nonfocal neuroexam.  No new headaches.  Hold on neuroimaging, neuro eval at this time and will continue to monitor with close follow-up, check labs as above to rule out anemia, check electrolytes, check TSH.  RTC/ER precautions given.  Uncontrolled hypertension - Plan: hydrochlorothiazide  (HYDRODIURIL ) 25 MG tablet, amLODipine  (NORVASC ) 10 MG tablet, Comprehensive metabolic panel with GFR, TSH  - Now appears to be stable with current combination of HCTZ and amlodipine .  Due for updated labs, will check those today and close follow-up.  No dose changes for now.  Erectile dysfunction, unspecified erectile dysfunction type - Plan: tadalafil  (CIALIS ) 20 MG tablet  - Tolerating current dose with half a pill as needed, denies any side effects or concerning symptoms as above.  Lowest effective dose discussed.  Need for vaccination against Streptococcus pneumoniae - Plan: Pneumococcal conjugate vaccine 20-valent (Prevnar 20)  Need for shingles vaccine - Plan: Varicella-zoster vaccine IM  Need for immunization against influenza - Plan: Flu vaccine trivalent PF, 6mos and older(Flulaval,Afluria,Fluarix,Fluzone)  Prediabetes - Plan: Hemoglobin A1c  - Has checked his own blood sugar intermittently without concerns, check updated A1c.  Adjust plan accordingly.  Blurred vision - Plan: Ambulatory referral to Ophthalmology  - Also with intermittent symptoms as above.  Check A1c, referred to ophthalmology for further evaluation with RTC/ER  precautions given.  Mixed hyperlipidemia - Plan: Lipid panel  - Check labs but he has been off statin as above.  We certainly can discuss concerns regarding statin  at follow-up but will check  baseline levels initially to determine treatment options.  Bilateral hand numbness Persistent R index and middle finger distal dysesthesias, intermittent left hand symptoms.  Unable to reproduce on exam including with carpal tunnel syndrome testing, cervical range of motion.  Check labs as above, follow-up in the next few weeks to discuss further with option of neurology evaluation if unable to determine cause based on above results or in follow-up.  RTC/ER precautions if acute worsening.  Meds ordered this encounter  Medications   hydrochlorothiazide  (HYDRODIURIL ) 25 MG tablet    Sig: TAKE 1 TABLET(25 MG) BY MOUTH DAILY    Dispense:  90 tablet    Refill:  1   amLODipine  (NORVASC ) 10 MG tablet    Sig: TAKE 1 TABLET(10 MG) BY MOUTH DAILY    Dispense:  90 tablet    Refill:  1   tadalafil  (CIALIS ) 20 MG tablet    Sig: Take 0.5 tablets (10 mg total) by mouth daily as needed for erectile dysfunction.    Dispense:  6 tablet    Refill:  3   Patient Instructions  Thank you for coming in today.  I will check some labs initially and then we will follow-up in the next few weeks to discuss the intermittent vision, dizziness, balance issues further.  If any worsening of the symptoms in the meantime please be seen.  I will also refer you to ophthalmology to discuss the intermittent vision symptoms.  If any concerns on labs I will let you know.  No change in meds for now.  Depending on cholesterol level may need to restart that statin medication but certainly we can discuss any new side effects on that med or change to a different medication if needed.  Happy to discuss further.  Thank you for coming in today and take care.    Signed,   Blake Pines, MD  Primary Care, [facility] 12/11/2024 9:14 AM      [1]  Allergies Allergen Reactions   Ace Inhibitors Other (See Comments)    ANGIOEDEMA OF JOINTS- SEVERE

## 2024-12-16 ENCOUNTER — Ambulatory Visit: Payer: Self-pay | Admitting: Family Medicine

## 2024-12-26 ENCOUNTER — Encounter: Payer: Self-pay | Admitting: Hematology

## 2024-12-27 NOTE — Progress Notes (Signed)
 COVID Vaccine received:  []  No []  Yes Date of any COVID positive Test in last 90 days:  PCP - Reyes Pines MD Cardiologist -   Chest x-ray -  EKG -   Stress Test -  ECHO -01/19/16 epic  Cardiac Cath -   Bowel Prep - []  No  []   Yes ______  Pacemaker / ICD device []  No []  Yes   Spinal Cord Stimulator:[]  No []  Yes       History of Sleep Apnea? []  No []  Yes   CPAP used?- []  No []  Yes    Does the patient monitor blood sugar?          []  No []  Yes  []  N/A  Patient has: []  NO Hx DM   []  Pre-DM                 []  DM1  []   DM2 Does patient have a [employer] or Dexacom? []  No []  Yes   Fasting Blood Sugar Ranges-  Checks Blood Sugar _____ times a day  GLP1 agonist / usual dose -  GLP1 instructions:  SGLT-2 inhibitors / usual dose -  SGLT-2 instructions:   Blood Thinner / Instructions: Aspirin Instructions:  Comments:   Activity level: Patient is able / unable to climb a flight of stairs without difficulty; []  No CP  []  No SOB, but would have ___   Patient can / can not perform ADLs without assistance.   Anesthesia review:   Patient denies shortness of breath, fever, cough and chest pain at PAT appointment.  Patient verbalized understanding and agreement to the Pre-Surgical Instructions that were given to them at this PAT appointment. Patient was also educated of the need to review these PAT instructions again prior to his/her surgery.I reviewed the appropriate phone numbers to call if they have any and questions or concerns.

## 2024-12-27 NOTE — Patient Instructions (Signed)
 SURGICAL WAITING ROOM VISITATION  Patients having surgery or a procedure may have no more than 2 support people in the waiting area - these visitors may rotate.    Children ages 75 and under will not be able to visit patients in [facility] under most circumstances.   Visitors with respiratory illnesses are discouraged from visiting and should remain at home.  If the patient needs to stay at the hospital during part of their recovery, the visitor guidelines for inpatient rooms apply. Pre-op nurse will coordinate an appropriate time for 1 support person to accompany patient in pre-op.  This support person may not rotate.    Please refer to the [facility] website for the visitor guidelines for Inpatients (after your surgery is over and you are in a regular room).       Your procedure is scheduled on: 01/10/25   Report to [facility] Main Entrance    Report to admitting at 5:15 AM   Call this number if you have problems the morning of surgery [phone]   Do not eat food :After Midnight.   After Midnight you may have the following liquids until ______ AM/ PM DAY OF SURGERY  Water Non-Citrus Juices (without pulp, NO RED-Apple, White grape, White cranberry) Black Coffee (NO MILK/CREAM OR CREAMERS, sugar ok)  Clear Tea (NO MILK/CREAM OR CREAMERS, sugar ok) regular and decaf                             Plain Jell-O (NO RED)                                           Fruit ices (not with fruit pulp, NO RED)                                     Popsicles (NO RED)                                                               Sports drinks like Gatorade (NO RED)                 The day of surgery:  Drink ONE (1) Pre-Surgery Clear Ensure  at AM the morning of surgery. Drink in one sitting. Do not sip.  This drink was given to you during your hospital  pre-op appointment visit. Nothing else to drink after completing the  Pre-Surgery Clear Ensure.     Oral Hygiene is  also important to reduce your risk of infection.                                    Remember - BRUSH YOUR TEETH THE MORNING OF SURGERY WITH YOUR REGULAR TOOTHPASTE  DENTURES WILL BE REMOVED PRIOR TO SURGERY PLEASE DO NOT APPLY Poly grip OR ADHESIVES!!!     Stop all vitamins and herbal supplements 7 days before surgery.   Take these medicines the morning of surgery with A SIP OF  WATER: amlodipine .               Do not take Hydrodiuril (hydrochlorothiazide ) the morning of surgery.             You may not have any metal on your body including hair pins, jewelry, and body piercing             Do not wear  lotions, powders, cologne, or deodorant              Men may shave face and neck.   Do not bring valuables to the hospital. [facility] IS NOT             RESPONSIBLE   FOR VALUABLES.   Contacts, glasses, dentures or bridgework may not be worn into surgery.   Bring small overnight bag day of surgery.   DO NOT BRING YOUR HOME MEDICATIONS TO THE HOSPITAL. PHARMACY WILL DISPENSE MEDICATIONS LISTED ON YOUR MEDICATION LIST TO YOU DURING YOUR ADMISSION IN THE HOSPITAL!    Patients discharged on the day of surgery will not be allowed to drive home.  Someone NEEDS to stay with you for the first 24 hours after anesthesia.   Special Instructions: Bring a copy of your healthcare power of attorney and living will documents the day of surgery if you haven't scanned them before.              Please read over the following fact sheets you were given: IF YOU HAVE QUESTIONS ABOUT YOUR PRE-OP INSTRUCTIONS PLEASE CALL [phone] [place]   If you received a COVID test during your pre-op visit  it is requested that you wear a mask when out in public, stay away from anyone that may not be feeling well and notify your surgeon if you develop symptoms. If you test positive for Covid or have been in contact with anyone that has tested positive in the last 10 days please notify you surgeon.       Pre-operative 4 CHG Bath Instructions  DYNA-Hex 4 Chlorhexidine Gluconate 4% Solution Antiseptic 4 fl. oz   You can play a key role in reducing the risk of infection after surgery. Your skin needs to be as free of germs as possible. You can reduce the number of germs on your skin by washing with CHG (chlorhexidine gluconate) soap before surgery. CHG is an antiseptic soap that kills germs and continues to kill germs even after washing.   DO NOT use if you have an allergy to chlorhexidine/CHG or antibacterial soaps. If your skin becomes reddened or irritated, stop using the CHG and notify one of our RNs at   Please shower with the CHG soap starting 4 days before surgery using the following schedule:     Please keep in mind the following:  DO NOT shave, including legs and underarms, starting the day of your first shower.   You may shave your face at any point before/day of surgery.  Place clean sheets on your bed the day you start using CHG soap. Use a clean washcloth (not used since being washed) for each shower. DO NOT sleep with pets once you start using the CHG.  CHG Shower Instructions:  If you choose to wash your hair and private area, wash first with your normal shampoo/soap.  After you use shampoo/soap, rinse your hair and body thoroughly to remove shampoo/soap residue.  Turn the water OFF and apply about 3 tablespoons (45 ml) of CHG soap to a CLEAN washcloth.  Apply  CHG soap ONLY FROM YOUR NECK DOWN TO YOUR TOES (washing for 3-5 minutes)  DO NOT use CHG soap on face, private areas, open wounds, or sores.  Pay special attention to the area where your surgery is being performed.  If you are having back surgery, having someone wash your back for you may be helpful. Wait 2 minutes after CHG soap is applied, then you may rinse off the CHG soap.  Pat dry with a clean towel  Put on clean clothes/pajamas   If you choose to wear lotion, please use ONLY the CHG-compatible lotions on the  back of this paper.     Additional instructions for the day of surgery: DO NOT APPLY any lotions, deodorants, cologne, or perfumes.   Put on clean/comfortable clothes.  Brush your teeth.  Ask your nurse before applying any prescription medications to the skin.   CHG Compatible Lotions   Aveeno Moisturizing lotion  Cetaphil Moisturizing Cream  Cetaphil Moisturizing Lotion  Clairol Herbal Essence Moisturizing Lotion, Dry Skin  Clairol Herbal Essence Moisturizing Lotion, Extra Dry Skin  Clairol Herbal Essence Moisturizing Lotion, Normal Skin  Curel Age Defying Therapeutic Moisturizing Lotion with Alpha Hydroxy  Curel Extreme Care Body Lotion  Curel Soothing Hands Moisturizing Hand Lotion  Curel Therapeutic Moisturizing Cream, Fragrance-Free  Curel Therapeutic Moisturizing Lotion, Fragrance-Free  Curel Therapeutic Moisturizing Lotion, Original Formula  Eucerin Daily Replenishing Lotion  Eucerin Dry Skin Therapy Plus Alpha Hydroxy Crme  Eucerin Dry Skin Therapy Plus Alpha Hydroxy Lotion  Eucerin Original Crme  Eucerin Original Lotion  Eucerin Plus Crme Eucerin Plus Lotion  Eucerin TriLipid Replenishing Lotion  Keri Anti-Bacterial Hand Lotion  Keri Deep Conditioning Original Lotion Dry Skin Formula Softly Scented  Keri Deep Conditioning Original Lotion, Fragrance Free Sensitive Skin Formula  Keri Lotion Fast Absorbing Fragrance Free Sensitive Skin Formula  Keri Lotion Fast Absorbing Softly Scented Dry Skin Formula  Keri Original Lotion  Keri Skin Renewal Lotion Keri Silky Smooth Lotion  Keri Silky Smooth Sensitive Skin Lotion  Nivea Body Creamy Conditioning Oil  Nivea Body Extra Enriched Teacher, Adult Education Moisturizing Lotion Nivea Crme  Nivea Skin Firming Lotion  NutraDerm 30 Skin Lotion  NutraDerm Skin Lotion  NutraDerm Therapeutic Skin Cream  NutraDerm Therapeutic Skin Lotion  ProShield Protective Hand Cream   WHAT IS A BLOOD  TRANSFUSION? Blood Transfusion Information  A transfusion is the replacement of blood or some of its parts. Blood is made up of multiple cells which provide different functions. Red blood cells carry oxygen and are used for blood loss replacement. White blood cells fight against infection. Platelets control bleeding. Plasma helps clot blood. Other blood products are available for specialized needs, such as hemophilia or other clotting disorders. BEFORE THE TRANSFUSION  Who gives blood for transfusions?  Healthy volunteers who are fully evaluated to make sure their blood is safe. This is blood bank blood. Transfusion therapy is the safest it has ever been in the practice of medicine. Before blood is taken from a donor, a complete history is taken to make sure that person has no history of diseases nor engages in risky social behavior (examples are intravenous drug use or sexual activity with multiple partners). The donor's travel history is screened to minimize risk of transmitting infections, such as malaria. The donated blood is tested for signs of infectious diseases, such as HIV and hepatitis. The blood is then tested to be sure it is compatible with you in order to  minimize the chance of a transfusion reaction. If you or a relative donates blood, this is often done in anticipation of surgery and is not appropriate for emergency situations. It takes many days to process the donated blood. RISKS AND COMPLICATIONS Although transfusion therapy is very safe and saves many lives, the main dangers of transfusion include:  Getting an infectious disease. Developing a transfusion reaction. This is an allergic reaction to something in the blood you were given. Every precaution is taken to prevent this. The decision to have a blood transfusion has been considered carefully by your caregiver before blood is given. Blood is not given unless the benefits outweigh the risks. AFTER THE TRANSFUSION Right after  receiving a blood transfusion, you will usually feel much better and more energetic. This is especially true if your red blood cells have gotten low (anemic). The transfusion raises the level of the red blood cells which carry oxygen, and this usually causes an energy increase. The nurse administering the transfusion will monitor you carefully for complications. HOME CARE INSTRUCTIONS  No special instructions are needed after a transfusion. You may find your energy is better. Speak with your caregiver about any limitations on activity for underlying diseases you may have. SEEK MEDICAL CARE IF:  Your condition is not improving after your transfusion. You develop redness or irritation at the intravenous (IV) site. SEEK IMMEDIATE MEDICAL CARE IF:  Any of the following symptoms occur over the next 12 hours: Shaking chills. You have a temperature by mouth above 102 F (38.9 C), not controlled by medicine. Chest, back, or muscle pain. People around you feel you are not acting correctly or are confused. Shortness of breath or difficulty breathing. Dizziness and fainting. You get a rash or develop hives. You have a decrease in urine output. Your urine turns a dark color or changes to pink, red, or brown. Any of the following symptoms occur over the next 10 days: You have a temperature by mouth above 102 F (38.9 C), not controlled by medicine. Shortness of breath. Weakness after normal activity. The white part of the eye turns yellow (jaundice). You have a decrease in the amount of urine or are urinating less often. Your urine turns a dark color or changes to pink, red, or brown. Document Released: 12/09/2000 Document Revised: 03/05/2012 Document Reviewed: 07/28/2008 [facility] Patient Information 2014 [place], [place].

## 2024-12-27 NOTE — Progress Notes (Signed)
 Request sent to Dr. KYM Poli to send pre op orders for PST appointment 01/01/24.

## 2024-12-28 ENCOUNTER — Other Ambulatory Visit: Payer: Self-pay | Admitting: Family Medicine

## 2024-12-28 DIAGNOSIS — E782 Mixed hyperlipidemia: Secondary | ICD-10-CM

## 2024-12-30 ENCOUNTER — Telehealth: Payer: Self-pay | Admitting: Radiology

## 2024-12-30 NOTE — Telephone Encounter (Signed)
 Patient stopped by the office today and asked if he could be given more info on how much time OOW and other questions he has about his upcoming surgery.  Can you please call him?  Thanks.

## 2024-12-30 NOTE — Telephone Encounter (Signed)
 Patient called back, he missed your call.  Can you send a MyChart message? He needs to know about how long he will be out of work, needs to turn in his STD paperwork, needs guidance, etc.  Says he has not s/w Dr Vernetta since surgery was scheduled.

## 2024-12-31 ENCOUNTER — Encounter (HOSPITAL_COMMUNITY): Payer: Self-pay

## 2024-12-31 ENCOUNTER — Encounter (HOSPITAL_COMMUNITY)
Admission: RE | Admit: 2024-12-31 | Discharge: 2024-12-31 | Disposition: A | Discharge status: Home / Self Care | Payer: Self-pay | Source: Ambulatory Visit | Attending: Orthopaedic Surgery | Admitting: Orthopaedic Surgery

## 2024-12-31 ENCOUNTER — Encounter: Payer: Self-pay | Admitting: Hematology

## 2024-12-31 ENCOUNTER — Other Ambulatory Visit: Payer: Self-pay

## 2024-12-31 VITALS — BP 140/90 | HR 88 | Temp 98.3°F | Resp 16 | Ht 70.5 in | Wt 218.2 lb

## 2024-12-31 DIAGNOSIS — I10 Essential (primary) hypertension: Secondary | ICD-10-CM | POA: Insufficient documentation

## 2024-12-31 DIAGNOSIS — Z01810 Encounter for preprocedural cardiovascular examination: Secondary | ICD-10-CM | POA: Diagnosis present

## 2024-12-31 DIAGNOSIS — Z01818 Encounter for other preprocedural examination: Secondary | ICD-10-CM | POA: Diagnosis not present

## 2024-12-31 DIAGNOSIS — Z01812 Encounter for preprocedural laboratory examination: Secondary | ICD-10-CM | POA: Diagnosis present

## 2024-12-31 LAB — SURGICAL PCR SCREEN
MRSA, PCR: NEGATIVE
Staphylococcus aureus: NEGATIVE

## 2024-12-31 NOTE — Progress Notes (Signed)
 PCP - Reyes Pines MD Cardiologist - N/A   Chest x-ray - greater than 1 year EKG -  12/31/24 in [facility] Stress Test - Greater than 15-20 years ago ECHO -01/19/16 epic  Cardiac Cath - N/A   Bowel Prep - [x]  No  []   Yes ______   Pacemaker / ICD device [x]  No []  Yes   Spinal Cord Stimulator:[x]  No []  Yes       History of Sleep Apnea? [x]  No []  Yes   CPAP used?- [x]  No []  Yes     Does the patient monitor blood sugar?          [x]  No []  Yes  []  N/A   Patient has: [x]  NO Hx DM   []  Pre-DM                 []  DM1  []   DM2 Does patient have a [employer] or Dexacom? [x]  No []  Yes   Fasting Blood Sugar Ranges- N/A Checks Blood Sugar _N/A____ times a day   GLP1 agonist / usual dose - N/A GLP1 instructions: N/A SGLT-2 inhibitors / usual dose - N/A SGLT-2 instructions: N/A   Blood Thinner / Instructions:N/A Aspirin Instructions:N/A   Comments:    Activity level: Patient is able to climb a flight of stairs without difficulty; [x]  No CP  [x]  No SOB. Patient can perform ADLs without assistance.    Anesthesia review: N/A   Patient denies shortness of breath, fever, cough and chest pain at PAT appointment.   Patient verbalized understanding and agreement to the Pre-Surgical Instructions that were given to them at this PAT appointment. Patient was also educated of the need to review these PAT instructions again prior to his/her surgery.I reviewed the appropriate phone numbers to call if they have any and questions or concerns.

## 2024-12-31 NOTE — Telephone Encounter (Signed)
 I have called multiple times, no answer

## 2025-01-01 ENCOUNTER — Telehealth: Payer: Self-pay | Admitting: Orthopaedic Surgery

## 2025-01-01 NOTE — Telephone Encounter (Signed)
 Received

## 2025-01-01 NOTE — Telephone Encounter (Signed)
 Pt submitted medical release form, umum forms, and $20.00

## 2025-01-02 ENCOUNTER — Ambulatory Visit (INDEPENDENT_AMBULATORY_CARE_PROVIDER_SITE_OTHER): Admitting: Family Medicine

## 2025-01-02 ENCOUNTER — Encounter: Payer: Self-pay | Admitting: Family Medicine

## 2025-01-02 VITALS — BP 136/82 | HR 84 | Temp 98.6°F | Resp 12 | Ht 70.5 in | Wt 220.2 lb

## 2025-01-02 DIAGNOSIS — H538 Other visual disturbances: Secondary | ICD-10-CM | POA: Diagnosis not present

## 2025-01-02 DIAGNOSIS — E782 Mixed hyperlipidemia: Secondary | ICD-10-CM | POA: Diagnosis not present

## 2025-01-02 DIAGNOSIS — R42 Dizziness and giddiness: Secondary | ICD-10-CM

## 2025-01-02 DIAGNOSIS — R200 Anesthesia of skin: Secondary | ICD-10-CM

## 2025-01-02 NOTE — Progress Notes (Signed)
 "  Subjective:  Patient ID: Blake Roberts, male    DOB: April 14, 1966  Age: 59 y.o. MRN: [id number]  CC:  Chief Complaint  Patient presents with   Follow-up    Recheck vision, hand, dizziness symptoms and review lab results. No changes. Still the same    HPI Blake Roberts presents for   Follow-up from December visit  Dizziness Brief, intermittent symptoms discussed at his last visit.  Less likely orthostatic based on his description of symptoms, question middle ear component given symptoms with turning.  Nonfocal neuroexam, no new headaches at that time.  Labs obtained, reassuring.  Blood pressure was stable on regimen last visit. Has not noticed since last visit - has been hydrating more.   Intermittent blurred vision Also discussed last visit.  Referred to ophthalmology for further evaluation. Has improved since last visit - has not noticed since last visit. Has received calls from optho, just not able to schedule yet.   Hyperlipidemia: Off statin last visit, LDL was elevated.Recommended restarting Lipitor. Started this week. No known side effects at this time.  Lab Results  Component Value Date   CHOL 245 (H) 12/11/2024   HDL 64.30 12/11/2024   LDLCALC 156 (H) 12/11/2024   LDLDIRECT 94.0 06/20/2022   TRIG 125.0 12/11/2024   CHOLHDL 4 12/11/2024   Lab Results  Component Value Date   ALT 21 12/11/2024   AST 21 12/11/2024   ALKPHOS 65 12/11/2024   BILITOT 0.8 12/11/2024   Intermittent hand numbness Right index and middle finger distal dysesthesias, intermittent left symptoms, unable to reproduce on exam including carpal tunnel syndrome testing at his last visit or with cervical range of motion.  Considering neurology evaluation as of last visit. Has continued to have some numbness after 15 min of driving with arms up or with holding elliptical - resolves with arms down.  Hx of spinal stenosis in lumbar spine.  No arm weakness.  Neurosurgeon Dr. Onetha.      History Patient Active Problem List   Diagnosis Date Noted   Unilateral primary osteoarthritis, right hip 02/27/2023   Hyperlipidemia 05/10/2017   Erectile dysfunction 05/10/2017   Memory difficulty 09/26/2016   Hemochromatosis 07/11/2016   Episodic lightheadedness 12/25/2015   Anemia, unspecified 12/25/2015   Chronic left-sided low back pain with sciatica 07/15/2015   Essential hypertension 02/15/2015   Rotator cuff tear 06/09/2014   GERD (gastroesophageal reflux disease) 05/13/2014   Past Medical History:  Diagnosis Date   Acute meniscal tear of knee RIGHT KNEE   Anxiety    Arthritis RIGHT KNEE   Bulging lumbar disc L5 - S1   Depression    Hemochromatosis    Hyperlipidemia    Hypertension    Past Surgical History:  Procedure Laterality Date   COLONOSCOPY     KNEE ARTHROSCOPY  AGE 76 (APPROX)   RIGHT KNEE   KNEE ARTHROSCOPY  07/13/2012   Procedure: ARTHROSCOPY KNEE;  Surgeon: Lynwood SHAUNNA Bern, MD;  Location: [facility];  Service: Orthopedics;  Laterality: Right;  WITH PARTIAL MEDIAL MENISECTOMY AND REMOVAL OF LOOSE BODIES   SHOULDER ARTHROSCOPY Left    torn rotator cuff Right 2015   Allergies[1] Prior to Admission medications  Medication Sig Start Date End Date Taking? Authorizing Provider  amLODipine  (NORVASC ) 10 MG tablet TAKE 1 TABLET(10 MG) BY MOUTH DAILY 12/11/24   Levora Reyes SAUNDERS, MD  atorvastatin  (LIPITOR) 20 MG tablet TAKE 1 TABLET(20 MG) BY MOUTH DAILY Patient not taking: Reported on 12/31/2024 12/30/24  Levora Reyes SAUNDERS, MD  cyclobenzaprine  (FLEXERIL ) 10 MG tablet Take 1 tablet (10 mg total) by mouth 3 (three) times daily as needed for muscle spasms. Patient not taking: Reported on 12/31/2024 10/21/24   Vernetta Lonni GRADE, MD  hydrochlorothiazide  (HYDRODIURIL ) 25 MG tablet TAKE 1 TABLET(25 MG) BY MOUTH DAILY 12/11/24   Levora Reyes SAUNDERS, MD  ibuprofen (ADVIL) 200 MG tablet Take 400-600 mg by mouth daily as needed for mild pain (pain  score 1-3) or moderate pain (pain score 4-6).    [provider]  tadalafil  (CIALIS ) 20 MG tablet Take 0.5 tablets (10 mg total) by mouth daily as needed for erectile dysfunction. Patient not taking: Reported on 12/31/2024 12/11/24   Levora Reyes SAUNDERS, MD   Social History   Socioeconomic History   Marital status: Legally Separated    Spouse name: Not on file   Number of children: 0   Years of education: BA   Highest education level: Not on file  Occupational History   Occupation: paramedic  Tobacco Use   Smoking status: Never   Smokeless tobacco: Never  Vaping Use   Vaping status: Never Used  Substance and Sexual Activity   Alcohol use: Yes    Comment: 4 beers a month,states heavy beer drinker in younger years   Drug use: No   Sexual activity: Yes  Other Topics Concern   Not on file  Social History Narrative   Lives at home alone   Separated, 2 step-daughters   Right-handed   Caffeine: 2 diet Cokes per day   Social Drivers of Health   Tobacco Use: Low Risk (01/02/2025)   Patient History    Smoking Tobacco Use: Never    Smokeless Tobacco Use: Never    Passive Exposure: Not on file  Financial Resource Strain: Not on file  Food Insecurity: Not on file  Transportation Needs: Not on file  Physical Activity: Not on file  Stress: Not on file  Social Connections: Not on file  Intimate Partner Violence: Not on file  Depression (PHQ2-9): Low Risk (05/11/2023)   Depression (PHQ2-9)    PHQ-2 Score: 0  Alcohol Screen: Not on file  Housing: Not on file  Utilities: Not on file  Health Literacy: Not on file    Review of Systems   Objective:   Vitals:   01/02/25 0809  BP: 136/82  Pulse: 84  Resp: 12  Temp: 98.6 F (37 C)  TempSrc: Temporal  SpO2: 97%  Weight: 220 lb 3.2 oz (99.9 kg)  Height: 5' 10.5 (1.791 m)     Physical Exam Vitals reviewed.  Constitutional:      Appearance: He is well-developed.  HENT:     Head: Normocephalic and atraumatic.   Neck:     Vascular: No carotid bruit or JVD.  Cardiovascular:     Rate and Rhythm: Normal rate and regular rhythm.     Heart sounds: Normal heart sounds. No murmur heard. Pulmonary:     Effort: Pulmonary effort is normal.     Breath sounds: Normal breath sounds. No rales.  Musculoskeletal:     Right lower leg: No edema.     Left lower leg: No edema.     Comments: C-spine, decreased range of motion, primarily with extension, but range of motion does not reproduce arm/hand symptoms.  Equal, full strength with grip strength bilaterally, upper extremity strength bilaterally.  Skin:    General: Skin is warm and dry.  Neurological:     Mental Status: He  is alert and oriented to person, place, and time.  Psychiatric:        Mood and Affect: Mood normal.        Assessment & Plan:  Blake Roberts is a 59 y.o. male . Dizziness Blurred vision  - Symptoms have improved.  He has increased his fluid intake which may be partly responsible.  If dizziness, or blurred vision recurs I would recommend evaluation with neurology.  He will let me know.  Will follow-up in 2 months regardless.  Also advised to follow-up with ophthalmology as planned -he will call for appointment when possible as upcoming surgery planned.  Bilateral hand numbness - Plan: DG Cervical Spine Complete  - Symptoms with persistent arm elevation suggest possible cervical spine source or question thoracic outlet.  Favor cervical spine with possible spinal stenosis as same and lumbar spine.  Start with cervical spine x-ray, advised to contact his neurosurgeon for evaluation of the symptoms as well, and advanced imaging such as MRI may need to be discussed with neurosurgery.  Mixed hyperlipidemia  - Now back on Lipitor, tolerating current dose, check updated lipids at his next visit in 2 months.  No orders of the defined types were placed in this encounter.  Patient Instructions  Glad to hear the blurry vision and dizziness is  improved.  If the symptoms recur, we certainly can look into other causes including meeting with neurology.  Let me know.  I do still think follow-up with ophthalmology for exam would be a good idea.  Return their call when you are able.   As your hand symptoms are reproducible with elevating your arms for certain amount of time, I am suspicious that this could either be a cervical spine issue or a thoracic outlet issue.  Cervical spine would be most likely especially since you have had lumbar spine stenosis.  Lets start initially with a cervical spine x-ray at the [facility] location below, and it may be worth calling your neurosurgeon for an appointment to discuss the symptoms as well.  Advanced imaging such as MRI can be discussed with your neurosurgeon.  Continue Lipitor and we can recheck your cholesterol levels at follow-up in 2 months.  Let me know if there are questions in the meantime and good luck with the upcoming surgery.    Signed,   Reyes Pines, MD [facility], [facility] 01/02/2025 8:25 AM      [1]  Allergies Allergen Reactions   Ace Inhibitors Other (See Comments)    ANGIOEDEMA OF JOINTS- SEVERE   "

## 2025-01-02 NOTE — Patient Instructions (Signed)
 Glad to hear the blurry vision and dizziness is improved.  If the symptoms recur, we certainly can look into other causes including meeting with neurology.  Let me know.  I do still think follow-up with ophthalmology for exam would be a good idea.  Return their call when you are able.   As your hand symptoms are reproducible with elevating your arms for certain amount of time, I am suspicious that this could either be a cervical spine issue or a thoracic outlet issue.  Cervical spine would be most likely especially since you have had lumbar spine stenosis.  Lets start initially with a cervical spine x-ray at the [facility] location below, and it may be worth calling your neurosurgeon for an appointment to discuss the symptoms as well.  Advanced imaging such as MRI can be discussed with your neurosurgeon.  Continue Lipitor and we can recheck your cholesterol levels at follow-up in 2 months.  Let me know if there are questions in the meantime and good luck with the upcoming surgery.

## 2025-01-06 ENCOUNTER — Telehealth: Payer: Self-pay | Admitting: Orthopaedic Surgery

## 2025-01-06 NOTE — Telephone Encounter (Signed)
 Pt called saying that his hip is locking up and he can't work. He would like a Dr's note writing him out of work until his surgery on this Fri. Call back number is [phone].

## 2025-01-08 ENCOUNTER — Ambulatory Visit (INDEPENDENT_AMBULATORY_CARE_PROVIDER_SITE_OTHER)
Admission: RE | Admit: 2025-01-08 | Discharge: 2025-01-08 | Disposition: A | Discharge status: Home / Self Care | Source: Ambulatory Visit | Attending: Family Medicine | Admitting: Family Medicine

## 2025-01-08 DIAGNOSIS — R200 Anesthesia of skin: Secondary | ICD-10-CM | POA: Diagnosis not present

## 2025-01-09 NOTE — Anesthesia Preprocedure Evaluation (Addendum)
 "                                  Anesthesia Evaluation  Patient identified by MRN, date of birth, ID band Patient awake    Reviewed: Allergy & Precautions, NPO status , Patient's Chart, lab work & pertinent test results  Airway Mallampati: II  TM Distance: >3 FB Neck ROM: Full    Dental  (+) Dental Advisory Given   Pulmonary neg pulmonary ROS   breath sounds clear to auscultation       Cardiovascular hypertension, Pt. on medications  Rhythm:Regular Rate:Normal     Neuro/Psych   Anxiety Depression     Neuromuscular disease    GI/Hepatic Neg liver ROS,GERD  ,,  Endo/Other  negative endocrine ROS    Renal/GU negative Renal ROSLab Results      Component                Value               Date                      NA                       136                 12/11/2024                CL                       95 (L)              12/11/2024                K                        4.4                 12/11/2024                CO2                      31                  12/11/2024                BUN                      15                  12/11/2024                CREATININE               0.91                12/11/2024                GFR                      92.55               12/11/2024                CALCIUM   9.9                 12/11/2024                ALBUMIN                  4.9                 12/11/2024                GLUCOSE                  110 (H)             12/11/2024                Musculoskeletal  (+) Arthritis ,    Abdominal   Peds  Hematology  (+) Blood dyscrasia, anemia Lab Results      Component                Value               Date                      WBC                      6.8                 12/11/2024                HGB                      14.9                12/11/2024                HCT                      44.1                12/11/2024                MCV                      97.6                 12/11/2024                PLT                      293.0               12/11/2024              Anesthesia Other Findings   Reproductive/Obstetrics                              Anesthesia Physical Anesthesia Plan  ASA: 2  Anesthesia Plan: Spinal   Post-op Pain Management: Tylenol  PO (pre-op)*   Induction:   PONV Risk Score and Plan: 1 and Propofol  infusion, Dexamethasone , Midazolam  and Treatment may vary due to age or medical condition  Airway Management Planned: Natural Airway and Simple Face Mask  Additional Equipment:   Intra-op Plan:   Post-operative Plan:   Informed Consent: I have reviewed the patients History and Physical, chart, labs and discussed the procedure including  the risks, benefits and alternatives for the proposed anesthesia with the patient or authorized representative who has indicated his/her understanding and acceptance.       Plan Discussed with: CRNA  Anesthesia Plan Comments:          Anesthesia Quick Evaluation  "

## 2025-01-09 NOTE — H&P (Signed)
 TOTAL HIP ADMISSION H&P  Patient is admitted for right total hip arthroplasty.  Subjective:  Chief Complaint: right hip pain  HPI: Blake Roberts, 59 y.o. male, has a history of pain and functional disability in the right hip(s) due to arthritis and patient has failed non-surgical conservative treatments for greater than 12 weeks to include NSAIDs, activity modification, steroid injections and rest.  Onset of symptoms was gradual starting a few years ago with gradually worsening course since that time.The patient noted no past surgery on the right hip(s).  Patient currently rates pain in the right hip at 10 out of 10 with activity. Patient has night pain, worsening of pain with activity and weight bearing, trendelenberg gait, pain that interfers with activities of daily living, and pain with passive range of motion. Patient has evidence of subchondral sclerosis, periarticular osteophytes, and joint space narrowing by imaging studies. This condition presents safety issues increasing the risk of falls.  There is no current or active infection.  Patient Active Problem List   Diagnosis Date Noted   Unilateral primary osteoarthritis, right hip 02/27/2023   Hyperlipidemia 05/10/2017   Erectile dysfunction 05/10/2017   Memory difficulty 09/26/2016   Hemochromatosis 07/11/2016   Episodic lightheadedness 12/25/2015   Anemia, unspecified 12/25/2015   Chronic left-sided low back pain with sciatica 07/15/2015   Essential hypertension 02/15/2015   Rotator cuff tear 06/09/2014   GERD (gastroesophageal reflux disease) 05/13/2014   Past Medical History:  Diagnosis Date   Acute meniscal tear of knee RIGHT KNEE   Anxiety    Arthritis RIGHT KNEE   Bulging lumbar disc L5 - S1   Depression    Hemochromatosis    Hyperlipidemia    Hypertension     Past Surgical History:  Procedure Laterality Date   COLONOSCOPY     KNEE ARTHROSCOPY  AGE 21 (APPROX)   RIGHT KNEE   KNEE ARTHROSCOPY  07/13/2012    Procedure: ARTHROSCOPY KNEE;  Surgeon: Lynwood SHAUNNA Bern, MD;  Location: [facility];  Service: Orthopedics;  Laterality: Right;  WITH PARTIAL MEDIAL MENISECTOMY AND REMOVAL OF LOOSE BODIES   SHOULDER ARTHROSCOPY Left    torn rotator cuff Right 2015    No current facility-administered medications for this encounter.   Current Outpatient Medications  Medication Sig Dispense Refill Last Dose/Taking   amLODipine  (NORVASC ) 10 MG tablet TAKE 1 TABLET(10 MG) BY MOUTH DAILY 90 tablet 1 Taking   hydrochlorothiazide  (HYDRODIURIL ) 25 MG tablet TAKE 1 TABLET(25 MG) BY MOUTH DAILY 90 tablet 1 Taking   ibuprofen (ADVIL) 200 MG tablet Take 400-600 mg by mouth daily as needed for mild pain (pain score 1-3) or moderate pain (pain score 4-6).   Taking As Needed   atorvastatin  (LIPITOR) 20 MG tablet TAKE 1 TABLET(20 MG) BY MOUTH DAILY 90 tablet 1 Not Taking   cyclobenzaprine  (FLEXERIL ) 10 MG tablet Take 1 tablet (10 mg total) by mouth 3 (three) times daily as needed for muscle spasms. 30 tablet 1 Not Taking   Allergies[1]  Social History   Tobacco Use   Smoking status: Never   Smokeless tobacco: Never  Substance Use Topics   Alcohol use: Yes    Comment: 4 beers a month,states heavy beer drinker in younger years    Family History  Problem Relation Age of Onset   Breast cancer Mother    Lung cancer Mother    Diabetes Father        adopted father; no history of biological father   Heart disease  Father    Hyperlipidemia Father    Hypertension Father    Kidney disease Father    Heart Problems Brother        has pacemaker also handicapped   Pancreatic cancer Maternal Uncle        age early 74's   Colon cancer Neg Hx      Review of Systems  Objective:  Physical Exam Vitals reviewed.  Constitutional:      Appearance: Normal appearance. He is normal weight.  HENT:     Head: Normocephalic and atraumatic.  Eyes:     Extraocular Movements: Extraocular movements intact.      Pupils: Pupils are equal, round, and reactive to light.  Cardiovascular:     Rate and Rhythm: Normal rate and regular rhythm.  Pulmonary:     Effort: Pulmonary effort is normal.     Breath sounds: Normal breath sounds.  Abdominal:     Palpations: Abdomen is soft.  Musculoskeletal:     Cervical back: Normal range of motion and neck supple.     Right hip: Tenderness and bony tenderness present. Decreased range of motion. Decreased strength.  Neurological:     Mental Status: He is alert and oriented to person, place, and time.  Psychiatric:        Behavior: Behavior normal.     Vital signs in last 24 hours:    Labs:   Estimated body mass index is 31.15 kg/m as calculated from the following:   Height as of 01/02/25: 5' 10.5 (1.791 m).   Weight as of 01/02/25: 99.9 kg.   Imaging Review Plain radiographs demonstrate severe degenerative joint disease of the right hip(s). The bone quality appears to be excellent for age and reported activity level.      Assessment/Plan:  End stage arthritis, right hip(s)  The patient history, physical examination, clinical judgement of the provider and imaging studies are consistent with end stage degenerative joint disease of the right hip(s) and total hip arthroplasty is deemed medically necessary. The treatment options including medical management, injection therapy, arthroscopy and arthroplasty were discussed at length. The risks and benefits of total hip arthroplasty were presented and reviewed. The risks due to aseptic loosening, infection, stiffness, dislocation/subluxation,  thromboembolic complications and other imponderables were discussed.  The patient acknowledged the explanation, agreed to proceed with the plan and consent was signed. Patient is being admitted for inpatient treatment for surgery, pain control, PT, OT, prophylactic antibiotics, VTE prophylaxis, progressive ambulation and ADL's and discharge planning.The patient is planning  to be discharged home with home health services       [1]  Allergies Allergen Reactions   Ace Inhibitors Other (See Comments)    ANGIOEDEMA OF JOINTS- SEVERE

## 2025-01-10 ENCOUNTER — Other Ambulatory Visit (HOSPITAL_COMMUNITY): Payer: Self-pay

## 2025-01-10 ENCOUNTER — Encounter (HOSPITAL_COMMUNITY)
Admission: RE | Disposition: A | Discharge status: Home / Self Care | Payer: Self-pay | Source: Ambulatory Visit | Attending: Orthopaedic Surgery

## 2025-01-10 ENCOUNTER — Encounter (HOSPITAL_COMMUNITY): Payer: Self-pay | Admitting: Orthopaedic Surgery

## 2025-01-10 ENCOUNTER — Ambulatory Visit (HOSPITAL_COMMUNITY)
Admission: RE | Admit: 2025-01-10 | Discharge: 2025-01-10 | Disposition: A | Discharge status: Home / Self Care | Payer: Self-pay | Source: Ambulatory Visit | Attending: Orthopaedic Surgery | Admitting: Orthopaedic Surgery

## 2025-01-10 ENCOUNTER — Encounter (HOSPITAL_COMMUNITY): Payer: Self-pay | Admitting: Anesthesiology

## 2025-01-10 ENCOUNTER — Ambulatory Visit (HOSPITAL_COMMUNITY): Payer: Self-pay | Admitting: Anesthesiology

## 2025-01-10 ENCOUNTER — Ambulatory Visit (HOSPITAL_COMMUNITY)

## 2025-01-10 ENCOUNTER — Encounter: Payer: Self-pay | Admitting: Hematology

## 2025-01-10 DIAGNOSIS — M1711 Unilateral primary osteoarthritis, right knee: Secondary | ICD-10-CM | POA: Diagnosis not present

## 2025-01-10 DIAGNOSIS — I10 Essential (primary) hypertension: Secondary | ICD-10-CM | POA: Diagnosis not present

## 2025-01-10 DIAGNOSIS — M1611 Unilateral primary osteoarthritis, right hip: Secondary | ICD-10-CM | POA: Insufficient documentation

## 2025-01-10 HISTORY — PX: TOTAL HIP ARTHROPLASTY: SHX124

## 2025-01-10 LAB — CBC
HCT: 42.9 % (ref 39.0–52.0)
Hemoglobin: 14.4 g/dL (ref 13.0–17.0)
MCH: 32.6 pg (ref 26.0–34.0)
MCHC: 33.6 g/dL (ref 30.0–36.0)
MCV: 97.1 fL (ref 80.0–100.0)
Platelets: 286 K/uL (ref 150–400)
RBC: 4.42 MIL/uL (ref 4.22–5.81)
RDW: 12 % (ref 11.5–15.5)
WBC: 6.9 K/uL (ref 4.0–10.5)
nRBC: 0 % (ref 0.0–0.2)

## 2025-01-10 LAB — TYPE AND SCREEN
ABO/RH(D): A POS
Antibody Screen: NEGATIVE

## 2025-01-10 LAB — BASIC METABOLIC PANEL WITH GFR
Anion gap: 13 (ref 5–15)
BUN: 18 mg/dL (ref 6–20)
CO2: 25 mmol/L (ref 22–32)
Calcium: 9.8 mg/dL (ref 8.9–10.3)
Chloride: 98 mmol/L (ref 98–111)
Creatinine, Ser: 0.99 mg/dL (ref 0.61–1.24)
GFR, Estimated: >60 mL/min
Glucose, Bld: 182 mg/dL — ABNORMAL HIGH (ref 70–99)
Potassium: 3.7 mmol/L (ref 3.5–5.1)
Sodium: 136 mmol/L (ref 135–145)

## 2025-01-10 LAB — ABO/RH: ABO/RH(D): A POS

## 2025-01-10 MED ORDER — FENTANYL CITRATE (PF) 100 MCG/2ML IJ SOLN
INTRAMUSCULAR | Status: DC | PRN
  Administered 2025-01-10 (×2): 50 ug via INTRAVENOUS

## 2025-01-10 MED ORDER — LACTATED RINGERS IV SOLN: INTRAVENOUS | Status: DC

## 2025-01-10 MED ORDER — CEFAZOLIN SODIUM-DEXTROSE 2-4 GM/100ML-% IV SOLN
INTRAVENOUS | Status: AC
  Filled 2025-01-10: qty 100

## 2025-01-10 MED ORDER — LIDOCAINE HCL (PF) 2 % IJ SOLN
INTRAMUSCULAR | Status: AC
  Filled 2025-01-10: qty 5

## 2025-01-10 MED ORDER — SUGAMMADEX SODIUM 200 MG/2ML IV SOLN
INTRAVENOUS | Status: AC
  Filled 2025-01-10: qty 2

## 2025-01-10 MED ORDER — SODIUM CHLORIDE 0.9 % IR SOLN
Status: DC | PRN
  Administered 2025-01-10: 1000 mL

## 2025-01-10 MED ORDER — BUPIVACAINE-EPINEPHRINE (PF) 0.25% -1:200000 IJ SOLN
INTRAMUSCULAR | Status: DC | PRN
  Administered 2025-01-10: 30 mL via PERINEURAL

## 2025-01-10 MED ORDER — ASPIRIN 81 MG PO CHEW
81.0000 mg | CHEWABLE_TABLET | Freq: Two times a day (BID) | ORAL | 0 refills | Status: AC
  Filled 2025-01-10: qty 30, 15d supply, fill #0

## 2025-01-10 MED ORDER — ONDANSETRON HCL 4 MG/2ML IJ SOLN
INTRAMUSCULAR | Status: AC
  Filled 2025-01-10: qty 2

## 2025-01-10 MED ORDER — PHENYLEPHRINE HCL-NACL 20-0.9 MG/250ML-% IV SOLN
INTRAVENOUS | Status: DC | PRN
  Administered 2025-01-10: 35 ug/min via INTRAVENOUS

## 2025-01-10 MED ORDER — ROCURONIUM BROMIDE 10 MG/ML (PF) SYRINGE
PREFILLED_SYRINGE | INTRAVENOUS | Status: AC
  Filled 2025-01-10: qty 10

## 2025-01-10 MED ORDER — DROPERIDOL 2.5 MG/ML IJ SOLN: 0.6250 mg | Freq: Once | INTRAMUSCULAR | Status: DC | PRN

## 2025-01-10 MED ORDER — FENTANYL CITRATE (PF) 50 MCG/ML IJ SOSY
PREFILLED_SYRINGE | INTRAMUSCULAR | Status: AC
  Filled 2025-01-10: qty 1

## 2025-01-10 MED ORDER — CYCLOBENZAPRINE HCL 10 MG PO TABS
10.0000 mg | ORAL_TABLET | Freq: Three times a day (TID) | ORAL | 1 refills | Status: DC | PRN
  Filled 2025-01-10: qty 30, 10d supply, fill #0

## 2025-01-10 MED ORDER — OXYCODONE HCL 5 MG PO TABS: 5.0000 mg | ORAL_TABLET | ORAL | Status: DC | PRN

## 2025-01-10 MED ORDER — 0.9 % SODIUM CHLORIDE (POUR BTL) OPTIME
TOPICAL | Status: DC | PRN
  Administered 2025-01-10: 1000 mL

## 2025-01-10 MED ORDER — BUPIVACAINE-EPINEPHRINE (PF) 0.25% -1:200000 IJ SOLN
INTRAMUSCULAR | Status: AC
  Filled 2025-01-10: qty 30

## 2025-01-10 MED ORDER — ROCURONIUM BROMIDE 10 MG/ML (PF) SYRINGE
PREFILLED_SYRINGE | INTRAVENOUS | Status: DC | PRN
  Administered 2025-01-10: 60 mg via INTRAVENOUS

## 2025-01-10 MED ORDER — OXYCODONE HCL 5 MG PO TABS
5.0000 mg | ORAL_TABLET | ORAL | 0 refills | Status: AC | PRN
  Filled 2025-01-10: qty 30, 3d supply, fill #0

## 2025-01-10 MED ORDER — ACETAMINOPHEN 500 MG PO TABS
1000.0000 mg | ORAL_TABLET | Freq: Once | ORAL | Status: AC
  Administered 2025-01-10: 1000 mg via ORAL
  Filled 2025-01-10: qty 2

## 2025-01-10 MED ORDER — PROPOFOL 10 MG/ML IV BOLUS
INTRAVENOUS | Status: DC | PRN
  Administered 2025-01-10: 200 mg via INTRAVENOUS
  Administered 2025-01-10: 20 mg via INTRAVENOUS
  Administered 2025-01-10: 10 mg via INTRAVENOUS
  Administered 2025-01-10: 100 ug/kg/min via INTRAVENOUS
  Administered 2025-01-10 (×2): 10 mg via INTRAVENOUS

## 2025-01-10 MED ORDER — MIDAZOLAM HCL 2 MG/2ML IJ SOLN
INTRAMUSCULAR | Status: AC
  Filled 2025-01-10: qty 2

## 2025-01-10 MED ORDER — LACTATED RINGERS IV BOLUS
250.0000 mL | Freq: Once | INTRAVENOUS | Status: AC
  Administered 2025-01-10: 250 mL via INTRAVENOUS

## 2025-01-10 MED ORDER — ONDANSETRON HCL 4 MG/2ML IJ SOLN
INTRAMUSCULAR | Status: DC | PRN
  Administered 2025-01-10: 4 mg via INTRAVENOUS

## 2025-01-10 MED ORDER — POVIDONE-IODINE 10 % EX SWAB
2.0000 | Freq: Once | CUTANEOUS | Status: AC
  Administered 2025-01-10: 2 via TOPICAL

## 2025-01-10 MED ORDER — HYDROMORPHONE HCL 1 MG/ML IJ SOLN
0.5000 mg | INTRAMUSCULAR | Status: DC | PRN
  Administered 2025-01-10: 1 mg via INTRAVENOUS

## 2025-01-10 MED ORDER — CHLORHEXIDINE GLUCONATE 0.12 % MT SOLN
15.0000 mL | Freq: Once | OROMUCOSAL | Status: AC
  Administered 2025-01-10: 15 mL via OROMUCOSAL

## 2025-01-10 MED ORDER — HYDROMORPHONE HCL 1 MG/ML IJ SOLN
INTRAMUSCULAR | Status: AC
  Filled 2025-01-10: qty 1

## 2025-01-10 MED ORDER — MIDAZOLAM HCL 5 MG/5ML IJ SOLN
INTRAMUSCULAR | Status: DC | PRN
  Administered 2025-01-10: 2 mg via INTRAVENOUS

## 2025-01-10 MED ORDER — PROPOFOL 1000 MG/100ML IV EMUL
INTRAVENOUS | Status: AC
  Filled 2025-01-10: qty 100

## 2025-01-10 MED ORDER — OXYCODONE HCL 5 MG PO TABS
ORAL_TABLET | ORAL | Status: AC
  Filled 2025-01-10: qty 1

## 2025-01-10 MED ORDER — LACTATED RINGERS IV BOLUS
500.0000 mL | Freq: Once | INTRAVENOUS | Status: AC
  Administered 2025-01-10: 500 mL via INTRAVENOUS

## 2025-01-10 MED ORDER — ORAL CARE MOUTH RINSE: 15.0000 mL | Freq: Once | OROMUCOSAL | Status: AC

## 2025-01-10 MED ORDER — FENTANYL CITRATE (PF) 100 MCG/2ML IJ SOLN
INTRAMUSCULAR | Status: AC
  Filled 2025-01-10: qty 2

## 2025-01-10 MED ORDER — FENTANYL CITRATE (PF) 50 MCG/ML IJ SOSY
25.0000 ug | PREFILLED_SYRINGE | INTRAMUSCULAR | Status: DC | PRN
  Administered 2025-01-10 (×2): 50 ug via INTRAVENOUS

## 2025-01-10 MED ORDER — CEFAZOLIN SODIUM-DEXTROSE 2-4 GM/100ML-% IV SOLN
2.0000 g | Freq: Four times a day (QID) | INTRAVENOUS | Status: AC
  Administered 2025-01-10: 2 g via INTRAVENOUS

## 2025-01-10 MED ORDER — MEPIVACAINE HCL (PF) 2 % IJ SOLN
INTRAMUSCULAR | Status: AC
  Filled 2025-01-10: qty 20

## 2025-01-10 MED ORDER — OXYCODONE HCL 5 MG/5ML PO SOLN: 5.0000 mg | Freq: Once | ORAL | Status: AC | PRN

## 2025-01-10 MED ORDER — OXYCODONE HCL 5 MG PO TABS: 10.0000 mg | ORAL_TABLET | ORAL | Status: DC | PRN

## 2025-01-10 MED ORDER — OXYCODONE HCL 5 MG PO TABS
5.0000 mg | ORAL_TABLET | Freq: Once | ORAL | Status: AC | PRN
  Administered 2025-01-10: 5 mg via ORAL

## 2025-01-10 MED ORDER — CEFAZOLIN SODIUM-DEXTROSE 2-4 GM/100ML-% IV SOLN
2.0000 g | INTRAVENOUS | Status: AC
  Administered 2025-01-10: 2 g via INTRAVENOUS
  Filled 2025-01-10: qty 100

## 2025-01-10 MED ORDER — SUGAMMADEX SODIUM 200 MG/2ML IV SOLN
INTRAVENOUS | Status: DC | PRN
  Administered 2025-01-10: 200 mg via INTRAVENOUS

## 2025-01-10 MED ORDER — TRANEXAMIC ACID-NACL 1000-0.7 MG/100ML-% IV SOLN
1000.0000 mg | INTRAVENOUS | Status: AC
  Administered 2025-01-10: 1000 mg via INTRAVENOUS
  Filled 2025-01-10: qty 100

## 2025-01-10 NOTE — Anesthesia Procedure Notes (Signed)
 Spinal  Patient location during procedure: OR Start time: 01/10/2025 7:15 AM End time: 01/10/2025 7:27 AM Reason for block: surgical anesthesia  Staffing Performed: anesthesiologist  Authorized by: Epifanio Charleston, MD   Performed by: Epifanio Charleston, MD  Preanesthetic Checklist Completed: patient identified, IV checked, site marked, risks and benefits discussed, surgical consent, monitors and equipment checked, pre-op evaluation and timeout performed Spinal Block Patient position: sitting Prep: DuraPrep Patient monitoring: heart rate, cardiac monitor, continuous pulse ox and blood pressure Approach: left paramedian Location: L3-4 Injection technique: single-shot Needle Needle type: Quincke  Needle gauge: 22 G Needle length: 9 cm Assessment Sensory level: T4 Events: failed spinal and CSF return  Additional Notes Pt with scoliosis and osteophytes at multiple levels. Unsuccessful attempts at multiple levels via midline approach with 24G pencan. 22G Quincke used via left paramedian approach with good flow of CSF and aspiration after injection. Pt passed pinch test but moving with surgical incision. Converted to GA.

## 2025-01-10 NOTE — Anesthesia Postprocedure Evaluation (Signed)
"   Anesthesia Post Note  Patient: Blake Roberts  Procedure(s) Performed: ARTHROPLASTY, HIP, TOTAL, ANTERIOR APPROACH (Right: Hip)     Patient location during evaluation: PACU Anesthesia Type: General Level of consciousness: awake and alert Pain management: pain level controlled Vital Signs Assessment: post-procedure vital signs reviewed and stable Respiratory status: spontaneous breathing, nonlabored ventilation, respiratory function stable and patient connected to nasal cannula oxygen Cardiovascular status: blood pressure returned to baseline and stable Postop Assessment: no apparent nausea or vomiting Anesthetic complications: no   No notable events documented.  Last Vitals:  Vitals:   01/10/25 1030 01/10/25 1130  BP: 107/67 117/82  Pulse: 71 75  Resp: 12 17  Temp:    SpO2: 99% 96%    Last Pain:  Vitals:   01/10/25 1130  TempSrc:   PainSc: 4                  Blake Roberts      "

## 2025-01-10 NOTE — Anesthesia Procedure Notes (Addendum)
 Procedure Name: Intubation Date/Time: 01/10/2025 7:50 AM  Performed by: Zulema Leita PARAS, CRNAPre-anesthesia Checklist: Patient identified, Emergency Drugs available, Suction available and Patient being monitored Patient Re-evaluated:Patient Re-evaluated prior to induction Oxygen Delivery Method: Circle system utilized Preoxygenation: Pre-oxygenation with 100% oxygen Induction Type: IV induction Ventilation: Mask ventilation without difficulty Laryngoscope Size: Glidescope and 4 Grade View: Grade I Tube type: Oral Tube size: 7.5 mm Number of attempts: 2 Airway Equipment and Method: Stylet Placement Confirmation: ETT inserted through vocal cords under direct vision, positive ETCO2 and breath sounds checked- equal and bilateral Secured at: 24 cm Tube secured with: Tape Dental Injury: Teeth and Oropharynx as per pre-operative assessment  Difficulty Due To: Difficulty was anticipated and Difficult Airway- due to limited oral opening Comments: DL x 1 with MAC 4 with poor view, Glidescope 4 utilized and ETT gently palced by Dr Epifanio. BBS=, ETCO2 +.

## 2025-01-10 NOTE — Evaluation (Signed)
 Physical Therapy Evaluation Patient Details Name: Blake Roberts MRN: [id number] DOB: 05/06/1966 Today's Date: 01/10/2025  History of Present Illness  59 yo male presents to therapy s/p R THA, anterior approach on 01/10/2025 due to failure of conservative measures. Pt PMH includes but is not limited to: HLD, hemochromatosis, anemia, LBP, HTN, GERD, and arthritis.  Clinical Impression    MURAD STAPLES is a 59 y.o. male POD 0 s/p R THA. Patient reports IND with mobility at baseline. Patient is now limited by functional impairments (see PT problem list below) and requires CGA for transfers and gait with RW. Patient was able to ambulate 70 feet with RW and CGA and cues for safe walker management. Patient educated on safe sequencing for stair mobility with use of RW and retrograde stepping pattern, fall risk prevention, use of RW, cp/ice, pain management and goal, slowly increasing activity level, slow and controlled movements with HEP, and car transfers pt and Graciela verbalized understanding of safe guarding position for people assisting with mobility. Patient instructed in exercises to facilitate ROM and circulation reviewed and HO provided. Patient will benefit from continued skilled PT interventions to address impairments and progress towards PLOF. Patient has met mobility goals at adequate level for discharge home with [facility] PT; will continue to follow if pt continues acute stay to progress towards Mod I goals.       If plan is discharge home, recommend the following: A little help with walking and/or transfers;A little help with bathing/dressing/bathroom;Assistance with cooking/housework;Assist for transportation;Help with stairs or ramp for entrance   Can travel by private vehicle        Equipment Recommendations Rolling walker (2 wheels)  Recommendations for Other Services       Functional Status Assessment Patient has had a recent decline in their functional status and demonstrates the  ability to make significant improvements in function in a reasonable and predictable amount of time.     Precautions / Restrictions Precautions Precautions: Fall Restrictions Weight Bearing Restrictions Per Provider Order: No      Mobility  Bed Mobility Overal bed mobility: Needs Assistance Bed Mobility: Supine to Sit     Supine to sit: Supervision     General bed mobility comments: min cues    Transfers Overall transfer level: Needs assistance Equipment used: Rolling walker (2 wheels) Transfers: Sit to/from Stand Sit to Stand: Contact guard assist           General transfer comment: min cues    Ambulation/Gait Ambulation/Gait assistance: Contact guard assist Gait Distance (Feet): 70 Feet Assistive device: Rolling walker (2 wheels) Gait Pattern/deviations: Step-to pattern, Decreased stance time - right, Antalgic, Trunk flexed Gait velocity: decreased     General Gait Details: slight trunk flexion with B UE support at RW to offload R LE in stance phase, pt has signifiant R toe out and knee flexion on R throughout the gait cycle pt indicates in need of R TKA as well, min cues for safety and RW management  Stairs Stairs: Yes Stairs assistance: Contact guard assist Stair Management: Two rails, Backwards, With walker Number of Stairs: 2 General stair comments: step navigation initiated with B handrails and min cues for safety, sequencing and technique, pt able to progress to retrograde stepping pattern with use of RW to navigate steps with cues and CGA, Graciela  observed and both parties verbalized understanding of step navigation with RW  Wheelchair Mobility     Tilt Bed    Modified Rankin (Stroke Patients Only)  Balance Overall balance assessment: Needs assistance Sitting-balance support: Feet supported Sitting balance-Leahy Scale: Good     Standing balance support: Bilateral upper extremity supported, During functional activity, Reliant on  assistive device for balance Standing balance-Leahy Scale: Poor                               Pertinent Vitals/Pain Pain Assessment Pain Assessment: 0-10 Pain Score: 2  Pain Location: R LE and hip Pain Descriptors / Indicators: Aching, Constant, Discomfort, Grimacing, Operative site guarding Pain Intervention(s): Limited activity within patient's tolerance, Monitored during session, Premedicated before session, Repositioned, Ice applied    Home Living Family/patient expects to be discharged to:: Private residence Living Arrangements: Alone Available Help at Discharge: Family Type of Home: House Home Access: Stairs to enter Entrance Stairs-Rails: None (has support) Secretary/administrator of Steps: 3   Home Layout: One level        Prior Function Prior Level of Function : Driving;Working/employed;Independent/Modified Independent             Mobility Comments: IND no AD for all ADLs, self care tasks and IADLs       Extremity/Trunk Assessment        Lower Extremity Assessment Lower Extremity Assessment: RLE deficits/detail RLE Deficits / Details: ankle DF/PF 5/5 RLE Sensation: WNL    Cervical / Trunk Assessment Cervical / Trunk Assessment: Normal  Communication   Communication Communication: No apparent difficulties    Cognition Arousal: Alert Behavior During Therapy: WFL for tasks assessed/performed   PT - Cognitive impairments: No apparent impairments                         Following commands: Intact       Cueing       General Comments      Exercises Total Joint Exercises Ankle Circles/Pumps: AROM, Both, 15 reps Quad Sets: AROM, Right, 5 reps Heel Slides: AROM, Right, 5 reps Hip ABduction/ADduction: AROM, Right, 5 reps, Standing Long Arc Quad: AROM, Right, 5 reps, Seated Knee Flexion: AROM, Right, 5 reps Standing Hip Extension: AROM, Left, 5 reps, Standing   Assessment/Plan    PT Assessment Patient needs continued  PT services  PT Problem List Decreased strength;Decreased range of motion;Decreased activity tolerance;Decreased balance;Decreased mobility;Decreased coordination;Pain       PT Treatment Interventions DME instruction;Gait training;Stair training;Functional mobility training;Therapeutic activities;Therapeutic exercise;Balance training;Neuromuscular re-education;Patient/family education;Modalities    PT Goals (Current goals can be found in the Care Plan section)  Acute Rehab PT Goals Patient Stated Goal: walking without pain, being able to do total body workouts PT Goal Formulation: With patient Time For Goal Achievement: 01/24/25 Potential to Achieve Goals: Good    Frequency 7X/week     Co-evaluation               AM-PAC PT 6 Clicks Mobility  Outcome Measure Help needed turning from your back to your side while in a flat bed without using bedrails?: None Help needed moving from lying on your back to sitting on the side of a flat bed without using bedrails?: A Little Help needed moving to and from a bed to a chair (including a wheelchair)?: A Little Help needed standing up from a chair using your arms (e.g., wheelchair or bedside chair)?: A Little Help needed to walk in hospital room?: A Little Help needed climbing 3-5 steps with a railing? : A Little 6 Click Score: 19  End of Session Equipment Utilized During Treatment: Gait belt Activity Tolerance: No increased pain;Patient tolerated treatment well Patient left: in chair;with call bell/phone within reach;with family/visitor present;with nursing/sitter in room Nurse Communication: Mobility status;Other (comment) (pt readiness from PT standpoint for same day d/c) PT Visit Diagnosis: Unsteadiness on feet (R26.81);Other abnormalities of gait and mobility (R26.89);Muscle weakness (generalized) (M62.81);Difficulty in walking, not elsewhere classified (R26.2);Pain Pain - Right/Left: Right Pain - part of body: Hip;Leg     Time: 8791-8758 PT Time Calculation (min) (ACUTE ONLY): 33 min   Charges:   PT Evaluation $PT Eval Low Complexity: 1 Low PT Treatments $Gait Training: 8-22 mins PT General Charges $$ ACUTE PT VISIT: 1 Visit         [place], PT Acute Rehab   [place] VEAR Drone 01/10/2025, 1:00 PM

## 2025-01-10 NOTE — Discharge Instructions (Signed)

## 2025-01-10 NOTE — Interval H&P Note (Signed)
 History and Physical Interval Note: Blake Roberts notes that he is here today for a right total hip replacement to treat his significant right hip arthritis and pain.  There has been no acute or interval change in his medical status.  The risks and benefits of surgery have been discussed in detail and informed consent has been obtained.  The right operative hip has been marked.  01/10/2025 6:59 AM  Blake Roberts  has presented today for surgery, with the diagnosis of Osteoarthritis Right Hip.  The various methods of treatment have been discussed with the patient and family. After consideration of risks, benefits and other options for treatment, the patient has consented to  Procedures: ARTHROPLASTY, HIP, TOTAL, ANTERIOR APPROACH (Right) as a surgical intervention.  The patient's history has been reviewed, patient examined, no change in status, stable for surgery.  I have reviewed the patient's chart and labs.  Questions were answered to the patient's satisfaction.     Blake Roberts

## 2025-01-10 NOTE — Transfer of Care (Signed)
 Immediate Anesthesia Transfer of Care Note  Patient: Blake Roberts  Procedure(s) Performed: ARTHROPLASTY, HIP, TOTAL, ANTERIOR APPROACH (Right: Hip)  Patient Location: PACU  Anesthesia Type:General  Level of Consciousness: awake, alert , and oriented  Airway & Oxygen Therapy: Patient Spontanous Breathing and Patient connected to face mask oxygen  Post-op Assessment: Report given to RN and Post -op Vital signs reviewed and stable  Post vital signs: Reviewed and stable  Last Vitals:  Vitals Value Taken Time  BP 125/81 01/10/25 09:05  Temp    Pulse 76 01/10/25 09:07  Resp 14 01/10/25 09:07  SpO2 98 % 01/10/25 09:07  Vitals shown include unfiled device data.  Last Pain:  Vitals:   01/10/25 0618  TempSrc: Oral  PainSc:          Complications: No notable events documented.

## 2025-01-10 NOTE — Op Note (Signed)
 "    Operative Note  Date of operation: 01/10/2025 Preoperative diagnosis: Right hip primary osteoarthritis Postoperative diagnosis: Same  Procedure: Right direct anterior total hip arthroplasty  Implants: Implant Name Type Inv. Item Serial No. Manufacturer Lot No. LRB No. Used Action  CUP ACET PNNCL SECTR W/GRIP 56 - [id number] Hips CUP ACET PNNCL SECTR W/GRIP 56  [employer] [id number] Right 1 Implanted  PINNACLE ALTRX PLUS 4 N [id number] - [id number] Hips PINNACLE ALTRX PLUS 4 N [id number]  [employer]TH[employer] [id number] Right 1 Implanted  STEM FEMORAL SZ6 HIGH ACTIS - [id number] Stem STEM FEMORAL SZ6 HIGH ACTIS  DEPUY ORTHOPA[employer]ICS [id number] Right 1 Implanted  HEAD CERAMIC 36 PLUS5 - [id number] Hips HEAD CERAMIC 36 PLUS5  [employer] [id number] Right 1 Implanted   Surgeon: 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  He is at the point where he has tried and failed all forms conservative treatment and his hip pain and knee pain are both detrimentally affecting his mobility, his quality of life and his actives day living.  At this point he does wish proceed with a right hip replacement first.  His x-rays show complete loss of the joint space with bone-on-bone wear and he has even had steroid injections in the right hip joint.  He does wish proceed to hip replacement and we agree with this as well.  We did discuss in length and detail the risks of acute blood loss anemia, nerve and vessel injury, fracture, infection, DVT, implant failure, leg length differences, wound healing issues, and dislocation.  He understands that our goals are hopefully decreased pain, improved mobility and improved quality of life.  Procedure description: After informed consent was  obtained and the right hip was marked, the patient was brought to the operating room and set up on stretcher where spinal anesthesia was obtained.  He was laid in supine position on stretcher and traction boots were placed on both his feet.  He was then placed supine on the Hana fracture table with a perineal post and placed in both legs in inline skeletal traction vices no traction applied.  His right operative hip pelvis were assessed radiographically.  The right hip was then prepped and draped DuraPrep and sterile drapes.  A timeout was called and he was identified as the correct patient and the correct right hip.  An incision was then made just inferior and posterior to the ASIS and carried slightly obliquely down the leg.  Dissection was carried down to the tensor fascia lata muscle and the tensor fascia was divided longitudinally to proceed with a direct intra approach the hip.  Circumflex vessels were identified and cauterized.  The hip capsule was identified and opened up in L-type format finding a moderate joint effusion.  Cobra retractors were placed around the medial and lateral femoral neck and a femoral neck cut was made with oscillating saw just proximal to the lesser trochanter and this cut was completed with an osteotome.  A corkscrew gauze placed in the femoral head and the femoral head was removed in its entirety and there was a wide area devoid of cartilage.  A bent Hohmann was then placed over the medial acetabular rim and the Esterwood labrum and other debris removed.  Reaming was then initiated from a size 43  reamer and stepwise increments going up to a size 55 reamer with all reamers placed under direct visualization and the last reamer placed under direct fluoroscopy in order to obtain the depth and reaming, the inclination and the anteversion.  The real DePuy Sectra GRIPTION acetabular component size 56 was then placed without difficulty followed by a 36+4 polyethylene liner based on the  patient's offset.  Attention was then turned to the femur.  With the right leg externally rotated to 130 degrees, extended and adducted, a Mueller retractors placed medially and a Hohmann tractor by the greater trochanter.  The lateral joint capsule was released and a box cutting osteotome was used in the femoral canal.  Broaching was then initiated using the Actis broaching system from a size 0 going up to a size 6.  With a size 6 in place we trialed a high offset femoral neck and a 36+1.5 trial head ball.  The right leg was brought over and up and with traction and internal rotation reduced in the pelvis.  Based on radiographic and clinical assessment we felt like we needed more leg length for stability purposes.  We dislocated the hip and the trial components.  We then placed the real Actis femoral component with high offset size 6 and with the real 36+5 ceramic head ball.  Again this reduced the pelvis and we are pleased with radiographic and clinical assessment of stability and implant placement.  We then irrigated the soft tissue with normal saline solution.  The joint capsule was closed with interrupted #1 Ethibond suture.  Marcaine  with epinephrine  was placed around the arthrotomy as well as the skin and superficial tissues.  The tensor fascia was closed with interrupted #1 Vicryl followed by 0 Vicryl close the deep tissue and 2-0 Vicryl close subcutaneous tissue.  The skin was closed with staples.  An Aquacel dressing was applied.  The patient was awakened, extubated and taken to the recovery room in stable condition.  Of note it does look like that his spinal did do well by the end of the case and an In-N-Out catheterization was performed.  Postoperatively he is doing well he would like to go home later today so we will rehab him through the PACU and see if that is appropriate.  Tory Gaskins, PA-C did assist during the entire case and beginning to end and his assistance was crucial and medically necessary for  soft tissue management and retraction, helping guide implant placement and a layered closure of the wound. "

## 2025-01-11 ENCOUNTER — Ambulatory Visit: Payer: Self-pay | Admitting: Family Medicine

## 2025-01-13 ENCOUNTER — Encounter (HOSPITAL_COMMUNITY): Payer: Self-pay | Admitting: Orthopaedic Surgery

## 2025-01-23 ENCOUNTER — Ambulatory Visit: Admitting: Orthopaedic Surgery

## 2025-01-23 ENCOUNTER — Encounter: Payer: Self-pay | Admitting: Orthopaedic Surgery

## 2025-01-23 DIAGNOSIS — Z96641 Presence of right artificial hip joint: Secondary | ICD-10-CM

## 2025-01-23 MED ORDER — CYCLOBENZAPRINE HCL 10 MG PO TABS: 10.0000 mg | ORAL_TABLET | Freq: Three times a day (TID) | ORAL | 1 refills | Status: AC | PRN

## 2025-01-23 NOTE — Progress Notes (Signed)
 Blake Roberts comes in at 2 weeks status post a right total hip arthroplasty.  Significant right hip pain and arthritis.  He has been compliant with a baby aspirin  twice daily.  He says he just needs a refill of muscle relaxant but overall is doing well.  He does ambulate with a cane at home but used a walker today appropriately due to the icy weather.  On exam his calf is soft on the right side.  His incision looks good.  Staples are removed and stretch was applied.  I did aspirate about 50 cc of the seroma off of his hip soft tissue.  Overall things look good.  He will slowly increase his activities as comfort allows.  If he has a reoccurrence of a seroma that is uncomfortable he knows to call and see us  sooner but if not we will see him back in a month to see how he is doing overall but no x-rays are needed.

## 2025-01-28 ENCOUNTER — Telehealth: Payer: Self-pay

## 2025-01-29 ENCOUNTER — Telehealth: Payer: Self-pay | Admitting: Orthopaedic Surgery

## 2025-01-29 NOTE — Telephone Encounter (Signed)
 [employer] form completed. Placed at front desk for patient to pick up. Patient advised form is ready.

## 2025-01-29 NOTE — Telephone Encounter (Signed)
 Patient filled out medical release form for Short Term Disability and it is scanned into his chart.

## 2025-02-20 ENCOUNTER — Encounter: Admitting: Orthopaedic Surgery

## 2025-03-07 ENCOUNTER — Ambulatory Visit: Admitting: Family Medicine
